# Patient Record
Sex: Female | Born: 1980 | Race: Asian | Hispanic: No | Marital: Single | Smoking: Never smoker
Health system: Southern US, Community
[De-identification: ages and names within clinical notes are randomized; demographics above are authoritative.]

## PROBLEM LIST (undated history)

## (undated) ENCOUNTER — Inpatient Hospital Stay (HOSPITAL_COMMUNITY): Payer: Self-pay

## (undated) DIAGNOSIS — D649 Anemia, unspecified: Secondary | ICD-10-CM

## (undated) DIAGNOSIS — O10919 Unspecified pre-existing hypertension complicating pregnancy, unspecified trimester: Secondary | ICD-10-CM

## (undated) DIAGNOSIS — O139 Gestational [pregnancy-induced] hypertension without significant proteinuria, unspecified trimester: Secondary | ICD-10-CM

## (undated) DIAGNOSIS — Z8759 Personal history of other complications of pregnancy, childbirth and the puerperium: Secondary | ICD-10-CM

## (undated) DIAGNOSIS — O24419 Gestational diabetes mellitus in pregnancy, unspecified control: Secondary | ICD-10-CM

## (undated) HISTORY — PX: NO PAST SURGERIES: SHX2092

## (undated) HISTORY — DX: Gestational diabetes mellitus in pregnancy, unspecified control: O24.419

## (undated) HISTORY — DX: Personal history of other complications of pregnancy, childbirth and the puerperium: Z87.59

## (undated) HISTORY — DX: Unspecified pre-existing hypertension complicating pregnancy, unspecified trimester: O10.919

---

## 2006-02-26 ENCOUNTER — Ambulatory Visit: Payer: Self-pay | Admitting: Internal Medicine

## 2006-02-27 ENCOUNTER — Ambulatory Visit: Payer: Self-pay | Admitting: *Deleted

## 2006-03-10 ENCOUNTER — Encounter: Payer: Self-pay | Admitting: Internal Medicine

## 2006-03-10 ENCOUNTER — Ambulatory Visit: Payer: Self-pay | Admitting: Internal Medicine

## 2006-09-28 ENCOUNTER — Ambulatory Visit: Payer: Self-pay | Admitting: Internal Medicine

## 2006-11-04 ENCOUNTER — Encounter (INDEPENDENT_AMBULATORY_CARE_PROVIDER_SITE_OTHER): Payer: Self-pay | Admitting: *Deleted

## 2006-12-23 ENCOUNTER — Ambulatory Visit (HOSPITAL_COMMUNITY): Admission: RE | Admit: 2006-12-23 | Discharge: 2006-12-23 | Payer: Self-pay | Admitting: Family Medicine

## 2007-04-01 ENCOUNTER — Ambulatory Visit: Payer: Self-pay | Admitting: *Deleted

## 2007-04-01 ENCOUNTER — Inpatient Hospital Stay (HOSPITAL_COMMUNITY): Admission: AD | Admit: 2007-04-01 | Discharge: 2007-04-02 | Payer: Self-pay | Admitting: Gynecology

## 2007-04-05 ENCOUNTER — Inpatient Hospital Stay (HOSPITAL_COMMUNITY): Admission: AD | Admit: 2007-04-05 | Discharge: 2007-04-05 | Payer: Self-pay | Admitting: Gynecology

## 2007-04-05 ENCOUNTER — Ambulatory Visit: Payer: Self-pay | Admitting: *Deleted

## 2007-04-08 ENCOUNTER — Ambulatory Visit: Payer: Self-pay | Admitting: Advanced Practice Midwife

## 2007-04-08 ENCOUNTER — Inpatient Hospital Stay (HOSPITAL_COMMUNITY): Admission: AD | Admit: 2007-04-08 | Discharge: 2007-04-11 | Payer: Self-pay | Admitting: Obstetrics & Gynecology

## 2007-04-08 ENCOUNTER — Ambulatory Visit: Payer: Self-pay | Admitting: Obstetrics & Gynecology

## 2007-05-25 ENCOUNTER — Inpatient Hospital Stay (HOSPITAL_COMMUNITY): Admission: AD | Admit: 2007-05-25 | Discharge: 2007-05-25 | Payer: Self-pay | Admitting: Gynecology

## 2007-11-30 ENCOUNTER — Ambulatory Visit: Payer: Self-pay | Admitting: Internal Medicine

## 2009-11-22 ENCOUNTER — Ambulatory Visit: Payer: Self-pay | Admitting: Obstetrics & Gynecology

## 2009-11-22 LAB — CONVERTED CEMR LAB
ALT: <8 units/L (ref 0–35)
AST: 11 units/L (ref 0–37)
Albumin: 4 g/dL (ref 3.5–5.2)
Alkaline Phosphatase: 35 units/L — ABNORMAL LOW (ref 39–117)
BUN: 6 mg/dL (ref 6–23)
CO2: 22 meq/L (ref 19–32)
Calcium: 9 mg/dL (ref 8.4–10.5)
Chloride: 106 meq/L (ref 96–112)
Creatinine, Ser: 0.45 mg/dL (ref 0.40–1.20)
Glucose, Bld: 97 mg/dL (ref 70–99)
HCT: 33.3 % — ABNORMAL LOW (ref 36.0–46.0)
Hemoglobin: 11.2 g/dL — ABNORMAL LOW (ref 12.0–15.0)
MCHC: 33.6 g/dL (ref 30.0–36.0)
MCV: 66.9 fL — ABNORMAL LOW (ref 78.0–100.0)
Platelets: 263 10*3/uL (ref 150–400)
Potassium: 4.2 meq/L (ref 3.5–5.3)
RBC: 4.98 M/uL (ref 3.87–5.11)
RDW: 16.2 % — ABNORMAL HIGH (ref 11.5–15.5)
Sodium: 137 meq/L (ref 135–145)
TSH: 1.04 microintl units/mL (ref 0.350–4.500)
Total Bilirubin: 0.3 mg/dL (ref 0.3–1.2)
Total Protein: 6.5 g/dL (ref 6.0–8.3)
Uric Acid, Serum: 3.2 mg/dL (ref 2.4–7.0)
WBC: 9.5 10*3/uL (ref 4.0–10.5)

## 2009-11-23 ENCOUNTER — Encounter: Payer: Self-pay | Admitting: Obstetrics & Gynecology

## 2009-11-23 LAB — CONVERTED CEMR LAB
Collection Interval-CRCL: 24 hr
Creatinine 24 HR UR: 1248 mg/24hr (ref 700–1800)
Creatinine Clearance: 192 mL/min — ABNORMAL HIGH (ref 75–115)
Creatinine, Urine: 54.2 mg/dL

## 2009-12-13 ENCOUNTER — Ambulatory Visit (HOSPITAL_COMMUNITY): Admission: RE | Admit: 2009-12-13 | Discharge: 2009-12-13 | Payer: Self-pay | Admitting: Obstetrics & Gynecology

## 2009-12-13 ENCOUNTER — Ambulatory Visit: Payer: Self-pay | Admitting: Obstetrics & Gynecology

## 2009-12-27 ENCOUNTER — Ambulatory Visit: Payer: Self-pay | Admitting: Obstetrics & Gynecology

## 2010-01-17 ENCOUNTER — Ambulatory Visit: Payer: Self-pay | Admitting: Family Medicine

## 2010-01-31 ENCOUNTER — Ambulatory Visit: Payer: Self-pay | Admitting: Obstetrics & Gynecology

## 2010-02-14 ENCOUNTER — Ambulatory Visit: Payer: Self-pay | Admitting: Obstetrics and Gynecology

## 2010-02-21 ENCOUNTER — Ambulatory Visit (HOSPITAL_COMMUNITY)
Admission: RE | Admit: 2010-02-21 | Discharge: 2010-02-21 | Payer: Self-pay | Source: Home / Self Care | Attending: Family Medicine | Admitting: Family Medicine

## 2010-02-28 ENCOUNTER — Ambulatory Visit
Admission: RE | Admit: 2010-02-28 | Discharge: 2010-02-28 | Payer: Self-pay | Source: Home / Self Care | Attending: Obstetrics and Gynecology | Admitting: Obstetrics and Gynecology

## 2010-02-28 ENCOUNTER — Encounter: Payer: Self-pay | Admitting: Obstetrics & Gynecology

## 2010-02-28 LAB — CONVERTED CEMR LAB
HCT: 31.8 % — ABNORMAL LOW (ref 36.0–46.0)
HIV: NONREACTIVE
Hemoglobin: 10.4 g/dL — ABNORMAL LOW (ref 12.0–15.0)
MCHC: 32.7 g/dL (ref 30.0–36.0)
MCV: 67.7 fL — ABNORMAL LOW (ref 78.0–100.0)
Platelets: 248 K/uL (ref 150–400)
RBC: 4.7 M/uL (ref 3.87–5.11)
RDW: 16.6 % — ABNORMAL HIGH (ref 11.5–15.5)
WBC: 10.2 10*3/microliter (ref 4.0–10.5)

## 2010-03-04 LAB — POCT URINALYSIS DIPSTICK
Bilirubin Urine: NEGATIVE
Hgb urine dipstick: NEGATIVE
Ketones, ur: NEGATIVE mg/dL
Nitrite: NEGATIVE
Protein, ur: NEGATIVE mg/dL
Specific Gravity, Urine: 1.01 (ref 1.005–1.030)
Urine Glucose, Fasting: NEGATIVE mg/dL
Urobilinogen, UA: 0.2 mg/dL (ref 0.0–1.0)
pH: 7 (ref 5.0–8.0)

## 2010-03-10 ENCOUNTER — Encounter: Payer: Self-pay | Admitting: Family Medicine

## 2010-03-12 ENCOUNTER — Ambulatory Visit: Admission: RE | Admit: 2010-03-12 | Payer: Self-pay | Source: Home / Self Care | Admitting: Obstetrics & Gynecology

## 2010-03-14 ENCOUNTER — Encounter: Payer: Self-pay | Admitting: Obstetrics & Gynecology

## 2010-03-14 ENCOUNTER — Other Ambulatory Visit: Payer: Self-pay | Admitting: Obstetrics and Gynecology

## 2010-03-14 ENCOUNTER — Ambulatory Visit (HOSPITAL_COMMUNITY)
Admission: RE | Admit: 2010-03-14 | Discharge: 2010-03-14 | Payer: Self-pay | Source: Home / Self Care | Attending: Obstetrics and Gynecology | Admitting: Obstetrics and Gynecology

## 2010-03-14 ENCOUNTER — Ambulatory Visit
Admission: RE | Admit: 2010-03-14 | Discharge: 2010-03-14 | Payer: Self-pay | Source: Home / Self Care | Attending: Obstetrics & Gynecology | Admitting: Obstetrics & Gynecology

## 2010-03-14 DIAGNOSIS — O169 Unspecified maternal hypertension, unspecified trimester: Secondary | ICD-10-CM

## 2010-03-14 LAB — POCT URINALYSIS DIPSTICK
Bilirubin Urine: NEGATIVE
Hgb urine dipstick: NEGATIVE
Ketones, ur: NEGATIVE mg/dL
Nitrite: NEGATIVE
Protein, ur: NEGATIVE mg/dL
Specific Gravity, Urine: 1.02 (ref 1.005–1.030)
Urine Glucose, Fasting: NEGATIVE mg/dL
Urobilinogen, UA: 0.2 mg/dL (ref 0.0–1.0)
pH: 7 (ref 5.0–8.0)

## 2010-03-21 ENCOUNTER — Ambulatory Visit (HOSPITAL_COMMUNITY)
Admission: RE | Admit: 2010-03-21 | Discharge: 2010-03-21 | Disposition: A | Discharge status: Home / Self Care | Payer: Medicaid Other | Source: Ambulatory Visit | Attending: Obstetrics and Gynecology | Admitting: Obstetrics and Gynecology

## 2010-03-21 DIAGNOSIS — Z3689 Encounter for other specified antenatal screening: Secondary | ICD-10-CM | POA: Insufficient documentation

## 2010-03-21 DIAGNOSIS — O139 Gestational [pregnancy-induced] hypertension without significant proteinuria, unspecified trimester: Secondary | ICD-10-CM | POA: Insufficient documentation

## 2010-03-21 DIAGNOSIS — Z8751 Personal history of pre-term labor: Secondary | ICD-10-CM | POA: Insufficient documentation

## 2010-03-21 DIAGNOSIS — O169 Unspecified maternal hypertension, unspecified trimester: Secondary | ICD-10-CM

## 2010-03-28 ENCOUNTER — Other Ambulatory Visit: Payer: Self-pay | Admitting: Obstetrics & Gynecology

## 2010-03-28 ENCOUNTER — Other Ambulatory Visit: Payer: Self-pay

## 2010-03-28 DIAGNOSIS — O169 Unspecified maternal hypertension, unspecified trimester: Secondary | ICD-10-CM

## 2010-04-01 ENCOUNTER — Other Ambulatory Visit: Payer: Medicaid Other

## 2010-04-01 ENCOUNTER — Encounter (HOSPITAL_COMMUNITY): Payer: Self-pay

## 2010-04-01 ENCOUNTER — Ambulatory Visit (HOSPITAL_COMMUNITY)
Admission: RE | Admit: 2010-04-01 | Discharge: 2010-04-01 | Disposition: A | Discharge status: Home / Self Care | Payer: Medicaid Other | Source: Ambulatory Visit | Attending: Obstetrics & Gynecology | Admitting: Obstetrics & Gynecology

## 2010-04-01 DIAGNOSIS — O139 Gestational [pregnancy-induced] hypertension without significant proteinuria, unspecified trimester: Secondary | ICD-10-CM | POA: Insufficient documentation

## 2010-04-01 DIAGNOSIS — Z8751 Personal history of pre-term labor: Secondary | ICD-10-CM | POA: Insufficient documentation

## 2010-04-01 DIAGNOSIS — O169 Unspecified maternal hypertension, unspecified trimester: Secondary | ICD-10-CM

## 2010-04-02 ENCOUNTER — Ambulatory Visit (HOSPITAL_COMMUNITY): Payer: Medicaid Other

## 2010-04-04 ENCOUNTER — Other Ambulatory Visit: Payer: Medicaid Other

## 2010-04-04 ENCOUNTER — Other Ambulatory Visit: Payer: Self-pay

## 2010-04-04 DIAGNOSIS — O169 Unspecified maternal hypertension, unspecified trimester: Secondary | ICD-10-CM

## 2010-04-04 LAB — POCT URINALYSIS DIPSTICK
Bilirubin Urine: NEGATIVE
Hgb urine dipstick: NEGATIVE
Ketones, ur: NEGATIVE mg/dL
Nitrite: NEGATIVE
Protein, ur: NEGATIVE mg/dL
Specific Gravity, Urine: 1.01 (ref 1.005–1.030)
Urine Glucose, Fasting: NEGATIVE mg/dL
Urobilinogen, UA: 0.2 mg/dL (ref 0.0–1.0)
pH: 6 (ref 5.0–8.0)

## 2010-04-08 ENCOUNTER — Other Ambulatory Visit: Payer: Medicaid Other

## 2010-04-08 DIAGNOSIS — O169 Unspecified maternal hypertension, unspecified trimester: Secondary | ICD-10-CM

## 2010-04-11 ENCOUNTER — Other Ambulatory Visit: Payer: Medicaid Other

## 2010-04-11 ENCOUNTER — Other Ambulatory Visit: Payer: Self-pay

## 2010-04-11 DIAGNOSIS — O169 Unspecified maternal hypertension, unspecified trimester: Secondary | ICD-10-CM

## 2010-04-11 LAB — POCT URINALYSIS DIPSTICK
Bilirubin Urine: NEGATIVE
Hgb urine dipstick: NEGATIVE
Ketones, ur: NEGATIVE mg/dL
Nitrite: NEGATIVE
Protein, ur: NEGATIVE mg/dL
Specific Gravity, Urine: 1.01 (ref 1.005–1.030)
Urine Glucose, Fasting: NEGATIVE mg/dL
Urobilinogen, UA: 0.2 mg/dL (ref 0.0–1.0)
pH: 6.5 (ref 5.0–8.0)

## 2010-04-13 ENCOUNTER — Inpatient Hospital Stay (HOSPITAL_COMMUNITY)
Admission: AD | Admit: 2010-04-13 | Discharge: 2010-04-15 | DRG: 774 | Disposition: A | Discharge status: Home / Self Care | Payer: Medicaid Other | Source: Ambulatory Visit | Attending: Obstetrics & Gynecology | Admitting: Obstetrics & Gynecology

## 2010-04-13 DIAGNOSIS — O4290 Premature rupture of membranes, unspecified as to length of time between rupture and onset of labor, unspecified weeks of gestation: Principal | ICD-10-CM | POA: Diagnosis present

## 2010-04-13 DIAGNOSIS — O1002 Pre-existing essential hypertension complicating childbirth: Secondary | ICD-10-CM | POA: Diagnosis present

## 2010-04-13 LAB — CBC
HCT: 31.1 % — ABNORMAL LOW (ref 36.0–46.0)
Hemoglobin: 9.8 g/dL — ABNORMAL LOW (ref 12.0–15.0)
MCH: 20.7 pg — ABNORMAL LOW (ref 26.0–34.0)
MCHC: 31.5 g/dL (ref 30.0–36.0)
MCV: 65.6 fL — ABNORMAL LOW (ref 78.0–100.0)
Platelets: 200 10*3/uL (ref 150–400)
RBC: 4.74 MIL/uL (ref 3.87–5.11)
RDW: 18.4 % — ABNORMAL HIGH (ref 11.5–15.5)
WBC: 9.2 10*3/uL (ref 4.0–10.5)

## 2010-04-13 LAB — RPR: RPR Ser Ql: NONREACTIVE

## 2010-04-15 ENCOUNTER — Other Ambulatory Visit: Payer: Medicaid Other

## 2010-04-18 ENCOUNTER — Other Ambulatory Visit: Payer: Medicaid Other

## 2010-04-29 LAB — POCT URINALYSIS DIPSTICK
Bilirubin Urine: NEGATIVE
Bilirubin Urine: NEGATIVE
Glucose, UA: NEGATIVE mg/dL
Glucose, UA: NEGATIVE mg/dL
Hgb urine dipstick: NEGATIVE
Hgb urine dipstick: NEGATIVE
Ketones, ur: NEGATIVE mg/dL
Ketones, ur: NEGATIVE mg/dL
Nitrite: NEGATIVE
Nitrite: NEGATIVE
Protein, ur: NEGATIVE mg/dL
Protein, ur: NEGATIVE mg/dL
Specific Gravity, Urine: 1.015 (ref 1.005–1.030)
Specific Gravity, Urine: <=1.005 (ref 1.005–1.030)
Urobilinogen, UA: 0.2 mg/dL (ref 0.0–1.0)
Urobilinogen, UA: 0.2 mg/dL (ref 0.0–1.0)
pH: 7 (ref 5.0–8.0)
pH: 7 (ref 5.0–8.0)

## 2010-04-30 LAB — POCT URINALYSIS DIPSTICK
Bilirubin Urine: NEGATIVE
Bilirubin Urine: NEGATIVE
Glucose, UA: NEGATIVE mg/dL
Glucose, UA: NEGATIVE mg/dL
Hgb urine dipstick: NEGATIVE
Hgb urine dipstick: NEGATIVE
Ketones, ur: NEGATIVE mg/dL
Ketones, ur: NEGATIVE mg/dL
Nitrite: NEGATIVE
Nitrite: NEGATIVE
Protein, ur: NEGATIVE mg/dL
Protein, ur: NEGATIVE mg/dL
Specific Gravity, Urine: 1.025 (ref 1.005–1.030)
Specific Gravity, Urine: 1.025 (ref 1.005–1.030)
Urobilinogen, UA: 0.2 mg/dL (ref 0.0–1.0)
Urobilinogen, UA: 0.2 mg/dL (ref 0.0–1.0)
pH: 6.5 (ref 5.0–8.0)
pH: 6.5 (ref 5.0–8.0)

## 2010-05-01 LAB — POCT URINALYSIS DIPSTICK
Bilirubin Urine: NEGATIVE
Glucose, UA: NEGATIVE mg/dL
Hgb urine dipstick: NEGATIVE
Ketones, ur: NEGATIVE mg/dL
Nitrite: NEGATIVE
Protein, ur: NEGATIVE mg/dL
Specific Gravity, Urine: 1.015 (ref 1.005–1.030)
Urobilinogen, UA: 0.2 mg/dL (ref 0.0–1.0)
pH: 7.5 (ref 5.0–8.0)

## 2010-05-02 LAB — POCT URINALYSIS DIPSTICK
Bilirubin Urine: NEGATIVE
Glucose, UA: NEGATIVE mg/dL
Hgb urine dipstick: NEGATIVE
Ketones, ur: NEGATIVE mg/dL
Nitrite: NEGATIVE
Protein, ur: NEGATIVE mg/dL
Specific Gravity, Urine: 1.01 (ref 1.005–1.030)
Urobilinogen, UA: 0.2 mg/dL (ref 0.0–1.0)
pH: 6 (ref 5.0–8.0)

## 2010-05-22 ENCOUNTER — Inpatient Hospital Stay (HOSPITAL_COMMUNITY): Admission: AD | Admit: 2010-05-22 | Payer: Self-pay | Admitting: Family Medicine

## 2010-06-13 ENCOUNTER — Ambulatory Visit (INDEPENDENT_AMBULATORY_CARE_PROVIDER_SITE_OTHER): Payer: Medicaid Other | Admitting: Obstetrics and Gynecology

## 2010-06-14 NOTE — Group Therapy Note (Signed)
Tammy Byrd, Tammy Byrd NO.:  [id number]  MEDICAL RECORD NO.:  [id number]           PATIENT TYPE:  A  LOCATION:  [facility]                   FACILITY:  [facility]  PHYSICIAN:  Argentina Donovan, MD        DATE OF BIRTH:  Apr 24, 1980  DATE OF SERVICE:  06/14/2010                                 CLINIC NOTE  The patient is a 30 year old gravida 2, para 1-1-0-2 who had a normal spontaneous delivery of a full-term baby with no complications.  She was on labetalol for hypertension and today her blood pressure is 125/80.  I have told her to stop the medication and then come back in 2 weeks for blood pressure check.  In the meantime, we talked about contraception and she has decided after discussion of all the different types to go to the health department and get Implanon.  She has complained of some constipation and feels something coming out.  We examined her and found that she does not have any hemorrhoids and I am putting her on Amitiza 24 mcg b.i.d. to see if that will control that.  Meanwhile, we will get a referral to the Health Department for her Implanon insertion.  Other than that, normal postpartum examination.          ______________________________ Argentina Donovan, MD    PR/MEDQ  D:  06/13/2010  T:  06/14/2010  Job:  [id number]

## 2010-07-02 NOTE — Discharge Summary (Signed)
NAMEKATLYN, MULDREW                     ACCOUNT NO.:  [id number]   MEDICAL RECORD NO.:  [id number]          PATIENT TYPE:  INP   LOCATION:  9158                          [facility]   PHYSICIAN:  Tanya S. Shawnie Pons, M.D.   DATE OF BIRTH:  04-24-1980   DATE OF ADMISSION:  04/01/2007  DATE OF DISCHARGE:  04/02/2007                               DISCHARGE SUMMARY   ADMISSION DIAGNOSES:  1. Intrauterine pregnancy at 32 weeks 2 days.  2. Pregnancy induced hypertension.   DISCHARGE DIAGNOSES:  1. Intrauterine pregnancy at 32 weeks 3 days.  2. Mild pre-eclampsia.  3. Hyponatremia   PROCEDURE:  The patient had an ultrasound performed on the day of  admission showing a single gestation, cephalic presentation, placenta  was posterior above the cervical os.  AFI was 17.21 in the 63rd  percentile.  Estimated fetal weight was 1977 grams in the 60th  percentile.  Cervical length was 3.03-cm, measured trans-labially.   COMPLICATIONS:  None.   CONSULTATIONS:  None.   PERTINENT LABORATORY FINDINGS:  The patient's admission CBC showing  white blood cell count 12, hemoglobin 10.9, hematocrit 33.5, platelets  241.  Urinalysis showed 15 ketones, 0.2 urobilinogen, it was otherwise  negative.  Complete metabolic panel showed sodium 127, potassium 3.5,  chloride 96, CO2 22, glucose 85, BUN 4, creatinine 0.39.  Total  bilirubin 0.7, AST 17, ALT 12, uric acid 4.5, and creatinine clearance  was 97.  A 24-hour urine creatinine was 543 and urine volume was 3850.  A 24-hour protein was pending at time of dictation.   BRIEF PERTINENT ADMISSION HISTORY:  Ms. Meanor is a 30 year old gravida 1,  para 0 at 32 weeks 1 day gestation that was sent to the maternity  admission unit from the health department for evaluation of elevated  blood pressure.  Upon evaluation in the MAU, her blood pressure was  139/102, followed by 153/92.  She also was noted to have a headache.  She was admitted for evaluation.  Labs were drawn  with values stated  above.  She had a 24-hour urine begun and she was started on  betamethasone for fetal lung maturity.  Her headache resolved after  admission and she had no other signs or symptoms of pre-eclampsia.  She  had 1 to 2 plus deep tendon reflexes, no clonus.  She denied vision  changes, continued headache, right upper quadrant epigastric pain.  She  was in stable condition and NST was reactive prior to discharge.  She  did have a few contractions and her cervix was checked prior to  discharge.  She was closed, long, and high.  Her contractions were not  painful.  She described them as balling up of her stomach.  She was in  stable condition and ready for discharge.   DISCHARGE STATUS:  Stable.   DISCHARGE MEDICATIONS:  Prenatal vitamins one tablet p.o. daily.   DISCHARGE INSTRUCTIONS:  1. Discharged home.  2. Regular diet.  3. Activity as tolerated.  4. The patient was instructed to monitor for symptoms of decreased  fetal movement, headache, vision changes, or other concerns.  5. The patient is to follow up in the high risk clinic.  I have      contacted the high risk clinic and an appointment will be made.      She is to call [phone] to confirm when that appointment is.      Karlton Lemon, MD  Electronically Signed     ______________________________  Shelbie Proctor. Shawnie Pons, M.D.    NS/MEDQ  D:  04/02/2007  T:  04/04/2007  Job:  [id number]

## 2010-07-02 NOTE — Discharge Summary (Signed)
Tammy Byrd, Tammy Byrd                     ACCOUNT NO.:  [id number]   MEDICAL RECORD NO.:  [id number]          PATIENT TYPE:  INP   LOCATION:  9318                          [facility]   PHYSICIAN:  Tilda Burrow, M.D. DATE OF BIRTH:  1980-04-02   DATE OF ADMISSION:  04/08/2007  DATE OF DISCHARGE:  04/11/2007                               DISCHARGE SUMMARY   ADMITTING DIAGNOSIS:  Pregnancy 33 weeks 2 days, severe preeclampsia.   DISCHARGE DIAGNOSIS:  Pregnancy at 33 weeks, 3 days delivered.   PROCEDURE:  Foley bulb cervical ripening, magnesium sulfate, seizure  prophylaxis, Pitocin induction of labor, spontaneous vertex vaginal  delivery. Cathie Beams, C.N.M. The delivery was on the 20th.   DISCHARGE MEDICATIONS:  1. Labetalol 100 mg p.o. b.i.d. x2 weeks.  2. Prenatal vitamins daily.   HOSPITAL SUMMARY:  This is a 30 year old female at 33 weeks 2 days who  was admitted from the clinic for severe preeclampsia with blood pressure  of 151/104 with proteinuria and hyperreflexia.  The patient had a Foley  bulb initiated with Pitocin initiated 6 hours later with good response  to the Pitocin. She progressed to deliver within 18 hours of admission  of a spontaneous vaginal delivery, female, Apgars 9 and 9. The baby was  taken to the NICU due to prematurity.   The mother received magnesium sulfate seizure prophylaxis for 24 hours  with good blood pressure response with good blood pressures in the  120s/70s with pulses in the 90s.  Postpartum hemoglobin was 8.9,  hematocrit 27%, compared to admitting hemoglobin of 11.9.  Follow-up  will be in 2 weeks for blood pressure check and then routine postpartum  visit 4 weeks thereafter.   FUTURE CONTRACEPTION:  Unplanned.      Tilda Burrow, M.D.  Electronically Signed     JVF/MEDQ  D:  04/11/2007  T:  04/12/2007  Job:  [id number]

## 2010-11-08 LAB — COMPREHENSIVE METABOLIC PANEL
ALT: 12
ALT: <8
AST: 17
Albumin: 2.8 — ABNORMAL LOW
Alkaline Phosphatase: 106
Alkaline Phosphatase: 105
BUN: 4 — ABNORMAL LOW
BUN: 6
CO2: 22
Calcium: 9.2
Calcium: 10.1
Chloride: 96
Chloride: 97
Creatinine, Ser: 0.39 — ABNORMAL LOW
Creatinine, Ser: 0.4
GFR calc Af Amer: >60
GFR calc Af Amer: >60
GFR calc non Af Amer: >60
Glucose, Bld: 85
Glucose, Bld: 87
Glucose, Bld: 90
Potassium: 3.5
Potassium: 4
Sodium: 127 — ABNORMAL LOW
Sodium: 136
Total Bilirubin: 0.7
Total Bilirubin: 0.6
Total Protein: 6.5
Total Protein: 6

## 2010-11-08 LAB — CBC
HCT: 33.5 — ABNORMAL LOW
HCT: 27.1 — ABNORMAL LOW
HCT: 32.7 — ABNORMAL LOW
HCT: 33.8 — ABNORMAL LOW
Hemoglobin: 10.9 — ABNORMAL LOW
Hemoglobin: 10.7 — ABNORMAL LOW
MCHC: 32.7
MCV: 69.8 — ABNORMAL LOW
MCV: 69.7 — ABNORMAL LOW
MCV: 70 — ABNORMAL LOW
MCV: 70.2 — ABNORMAL LOW
Platelets: 241
Platelets: 182
Platelets: 196
Platelets: 256
RBC: 4.8
RDW: 16.4 — ABNORMAL HIGH
RDW: 16.5 — ABNORMAL HIGH
RDW: 16.5 — ABNORMAL HIGH
RDW: 16.6 — ABNORMAL HIGH
WBC: 12 — ABNORMAL HIGH
WBC: 12.5 — ABNORMAL HIGH
WBC: 18.7 — ABNORMAL HIGH

## 2010-11-08 LAB — CREATININE CLEARANCE, URINE, 24 HOUR
Collection Interval-CRCL: 24
Creatinine Clearance: 97
Creatinine, Urine: 14.1
Urine Total Volume-CRCL: 3850

## 2010-11-08 LAB — POCT URINALYSIS DIP (DEVICE)
Nitrite: NEGATIVE
Protein, ur: >=300 — AB
Urobilinogen, UA: 0.2
pH: 6.5

## 2010-11-08 LAB — URINALYSIS, ROUTINE W REFLEX MICROSCOPIC
Bilirubin Urine: NEGATIVE
Glucose, UA: NEGATIVE
Hgb urine dipstick: NEGATIVE
Ketones, ur: 15 — AB
Nitrite: NEGATIVE
Protein, ur: NEGATIVE
Specific Gravity, Urine: <1.005 — ABNORMAL LOW
Urobilinogen, UA: 0.2
pH: 7

## 2010-11-08 LAB — PROTEIN, URINE, 24 HOUR
Collection Interval-UPROT: 24
Protein, 24H Urine: 1078 — ABNORMAL HIGH
Protein, Urine: 28
Urine Total Volume-UPROT: 3850

## 2010-11-08 LAB — URIC ACID
Uric Acid, Serum: 4.5
Uric Acid, Serum: 4.6

## 2010-11-08 LAB — LACTATE DEHYDROGENASE: LDH: 147

## 2010-11-08 LAB — STREP B DNA PROBE

## 2010-11-08 LAB — RPR: RPR Ser Ql: NONREACTIVE

## 2013-12-19 ENCOUNTER — Encounter (HOSPITAL_COMMUNITY): Payer: Self-pay

## 2015-02-18 NOTE — L&D Delivery Note (Signed)
Delivery Note   Requested by Dr. Adrian BlackwaterStinson to attend this primary C-section delivery at 35 3/[redacted] weeks GA due to preterm labor, breech presentation.   Born to a G3P1, GBS unknown mother with limited PNC.  Pregnancy complicated by chronic hypertension, gestational diabetes.   Intrapartum course complicated by preterm labor. ROM occurred at delivery with clear fluid.   Infant with decreased tone, dusky with initial good spontaneous cry.  Routine NRP followed including warming, drying and stimulation. Good response.   Apgars 6 / 9.  Physical exam within normal limits but notable for sacral dimple.   Left in OR for skin-to-skin contact with mother, in care of CN staff.  Care transferred to Pediatrician.  Jaisen Wiltrout T, RN, NNP-BC

## 2015-04-06 ENCOUNTER — Encounter (HOSPITAL_COMMUNITY): Payer: Self-pay

## 2015-04-06 ENCOUNTER — Inpatient Hospital Stay (HOSPITAL_COMMUNITY)
Admission: AD | Admit: 2015-04-06 | Discharge: 2015-04-06 | Disposition: A | Discharge status: Home / Self Care | Payer: BLUE CROSS/BLUE SHIELD | Source: Ambulatory Visit | Attending: Obstetrics & Gynecology | Admitting: Obstetrics & Gynecology

## 2015-04-06 DIAGNOSIS — O161 Unspecified maternal hypertension, first trimester: Secondary | ICD-10-CM | POA: Diagnosis not present

## 2015-04-06 DIAGNOSIS — O162 Unspecified maternal hypertension, second trimester: Secondary | ICD-10-CM

## 2015-04-06 DIAGNOSIS — Z3A13 13 weeks gestation of pregnancy: Secondary | ICD-10-CM | POA: Insufficient documentation

## 2015-04-06 DIAGNOSIS — I10 Essential (primary) hypertension: Secondary | ICD-10-CM | POA: Diagnosis present

## 2015-04-06 HISTORY — DX: Gestational (pregnancy-induced) hypertension without significant proteinuria, unspecified trimester: O13.9

## 2015-04-06 LAB — URINALYSIS, ROUTINE W REFLEX MICROSCOPIC
BILIRUBIN URINE: NEGATIVE
GLUCOSE, UA: NEGATIVE mg/dL
Hgb urine dipstick: NEGATIVE
KETONES UR: 40 mg/dL — AB
Leukocytes, UA: NEGATIVE
Nitrite: NEGATIVE
PH: 6 (ref 5.0–8.0)
Protein, ur: NEGATIVE mg/dL
SPECIFIC GRAVITY, URINE: 1.015 (ref 1.005–1.030)

## 2015-04-06 MED ORDER — LABETALOL HCL 200 MG PO TABS: 200.0000 mg | ORAL_TABLET | Freq: Two times a day (BID) | ORAL | Status: DC

## 2015-04-06 NOTE — Discharge Instructions (Signed)
Hypertension During Pregnancy °Hypertension is also called high blood pressure. Blood pressure moves blood in your body. Sometimes, the force that moves the blood becomes too strong. When you are pregnant, this condition should be watched carefully. It can cause problems for you and your baby. °HOME CARE  °· Make and keep all of your doctor visits. °· Take medicine as told by your doctor. Tell your doctor about all medicines you take. °· Eat very little salt. °· Exercise regularly. °· Do not drink alcohol. °· Do not smoke. °· Do not have drinks with caffeine. °· Lie on your left side when resting. °· Your health care provider may ask you to take one low-dose aspirin (81mg) each day. °GET HELP RIGHT AWAY IF: °· You have bad belly (abdominal) pain. °· You have sudden puffiness (swelling) in the hands, ankles, or face. °· You gain 4 pounds (1.8 kilograms) or more in 1 week. °· You throw up (vomit) repeatedly. °· You have bleeding from the vagina. °· You do not feel the baby moving as much. °· You have a headache. °· You have blurred or double vision. °· You have muscle twitching or spasms. °· You have shortness of breath. °· You have blue fingernails and lips. °· You have blood in your pee (urine). °MAKE SURE YOU: °· Understand these instructions. °· Will watch your condition. °· Will get help right away if you are not doing well or get worse. °  °This information is not intended to replace advice given to you by your health care provider. Make sure you discuss any questions you have with your health care provider. °  °Document Released: 03/08/2010 Document Revised: 02/24/2014 Document Reviewed: 09/02/2012 °Elsevier Interactive Patient Education ©2016 [employer] ° °

## 2015-04-06 NOTE — MAU Note (Signed)
Was at [facility] for pregnancy confirmation today blood pressure elevated, they spoke with Dr. Macon Large was told to come to MAU and be set up for High Risk Clinic, patient denies history of high blood pressure, asymptomatic.

## 2015-04-06 NOTE — MAU Provider Note (Signed)
History     CSN: [id number]  Arrival date and time: 04/06/15 1035   First Provider Initiated Contact with Patient 04/06/15 1103      Chief Complaint  Patient presents with  . Hypertension   HPI Ms. Tammy Byrd is a 35 y.o. G3P1002 at [redacted]w[redacted]d who presents to MAU today from the [facility] with complaint of elevated blood pressure. The patient states a history of HTN in previous pregnancies, but is unsure of diagnosis of CHTN as she rarely goes to the doctor when she isn't pregnant. She was seen in [facility] for HTN in pregnancy with her previous pregnancies, the last of which was 5 years ago. She denies headache, vision changes, abdominal pain or vaginal bleeding today.   OB History    Gravida Para Term Preterm AB TAB SAB Ectopic Multiple Living   0 2      Past Medical History  Diagnosis Date  . Pregnancy induced hypertension     Past Surgical History  Procedure Laterality Date  . No past surgeries      History reviewed. No pertinent family history.  Social History  Substance Use Topics  . Smoking status: Never Smoker   . Smokeless tobacco: None  . Alcohol Use: No    Allergies: No Known Allergies  Prescriptions prior to admission  Medication Sig Dispense Refill Last Dose  . acetaminophen (TYLENOL) 325 MG tablet Take 325 mg by mouth every 6 (six) hours as needed for mild pain or headache.   04/05/2015 at Unknown time  . Prenatal Vit-Fe Fumarate-FA (PRENATAL MULTIVITAMIN) TABS tablet Take 1 tablet by mouth daily at 12 noon.   04/05/2015 at Unknown time    Review of Systems  Constitutional: Negative for malaise/fatigue.  Eyes: Negative for blurred vision.  Gastrointestinal: Negative for abdominal pain.  Genitourinary:       Neg - vaginal bleeding  Neurological: Negative for headaches.   Physical Exam   Blood pressure 141/81, pulse 96, temperature 97.9 F (36.6 C), temperature source Oral, resp. rate 18, height 5' 1.5" (1.562 m), weight 170 lb 4.8 oz (77.248 kg), last  menstrual period 12/30/2014, unknown if currently breastfeeding.  Physical Exam  Nursing note and vitals reviewed. Constitutional: She is oriented to person, place, and time. She appears well-developed and well-nourished. No distress.  HENT:  Head: Normocephalic and atraumatic.  Cardiovascular: Normal rate.   Respiratory: Effort normal.  GI: Soft. She exhibits no distension and no mass. There is no tenderness. There is no rebound and no guarding.  Neurological: She is alert and oriented to person, place, and time.  Skin: Skin is warm and dry. No erythema.  Psychiatric: She has a normal mood and affect.    Results for orders placed or performed during the hospital encounter of 04/06/15 (from the past 24 hour(s))  Urinalysis, Routine w reflex microscopic (not at [facility])     Status: Abnormal   Collection Time: 04/06/15 10:50 AM  Result Value Ref Range   Color, Urine YELLOW YELLOW   APPearance CLEAR CLEAR   Specific Gravity, Urine 1.015 1.005 - 1.030   pH 6.0 5.0 - 8.0   Glucose, UA NEGATIVE NEGATIVE mg/dL   Hgb urine dipstick NEGATIVE NEGATIVE   Bilirubin Urine NEGATIVE NEGATIVE   Ketones, ur 40 (A) NEGATIVE mg/dL   Protein, ur NEGATIVE NEGATIVE mg/dL   Nitrite NEGATIVE NEGATIVE   Leukocytes, UA NEGATIVE NEGATIVE    Patient Vitals for the past 24 hrs:  BP  Temp Temp src Pulse Resp Height Weight  04/06/15 1144 141/81 mmHg - - 96 - - -  04/06/15 1104 151/88 mmHg - - 102 - - -  04/06/15 1047 161/90 mmHg 97.9 F (36.6 C) Oral 99 18 5' 1.5" (1.562 m) 170 lb 4.8 oz (77.248 kg)    MAU Course  Procedures None  MDM FHR - 150 bpm with doppler UA today Discussed patient with Dr. Macon Large. Start on Labetalol 200 mg BID and give appointment to start prenatal care with [facility]  Assessment and Plan  A: SIUP at [redacted]w[redacted]d Hypertension  P: Discharge home Rx for Labetalol given to patient Early pregnancy and HTN precautions discussed Patient advised to follow-up with [facility] on Wednesday at  1:00pm to start prenatal care Patient may return to MAU as needed or if her condition were to change or worsen   Marny Lowenstein, PA-C  04/06/2015, 11:54 AM

## 2015-04-11 ENCOUNTER — Encounter: Payer: Self-pay | Admitting: Advanced Practice Midwife

## 2015-04-11 ENCOUNTER — Ambulatory Visit (INDEPENDENT_AMBULATORY_CARE_PROVIDER_SITE_OTHER): Payer: BLUE CROSS/BLUE SHIELD | Admitting: Advanced Practice Midwife

## 2015-04-11 VITALS — BP 142/88 | HR 85 | Temp 98.2°F | Wt 173.4 lb

## 2015-04-11 DIAGNOSIS — O09522 Supervision of elderly multigravida, second trimester: Secondary | ICD-10-CM | POA: Diagnosis not present

## 2015-04-11 DIAGNOSIS — O10912 Unspecified pre-existing hypertension complicating pregnancy, second trimester: Secondary | ICD-10-CM

## 2015-04-11 DIAGNOSIS — Z1151 Encounter for screening for human papillomavirus (HPV): Secondary | ICD-10-CM | POA: Diagnosis not present

## 2015-04-11 DIAGNOSIS — Z23 Encounter for immunization: Secondary | ICD-10-CM | POA: Diagnosis not present

## 2015-04-11 DIAGNOSIS — Z113 Encounter for screening for infections with a predominantly sexual mode of transmission: Secondary | ICD-10-CM

## 2015-04-11 DIAGNOSIS — O10919 Unspecified pre-existing hypertension complicating pregnancy, unspecified trimester: Secondary | ICD-10-CM

## 2015-04-11 DIAGNOSIS — Z124 Encounter for screening for malignant neoplasm of cervix: Secondary | ICD-10-CM

## 2015-04-11 DIAGNOSIS — O09899 Supervision of other high risk pregnancies, unspecified trimester: Secondary | ICD-10-CM

## 2015-04-11 DIAGNOSIS — O99012 Anemia complicating pregnancy, second trimester: Secondary | ICD-10-CM

## 2015-04-11 DIAGNOSIS — D573 Sickle-cell trait: Secondary | ICD-10-CM | POA: Diagnosis not present

## 2015-04-11 LAB — POCT URINALYSIS DIP (DEVICE)
Bilirubin Urine: NEGATIVE
Glucose, UA: NEGATIVE mg/dL
Hgb urine dipstick: NEGATIVE
KETONES UR: NEGATIVE mg/dL
Leukocytes, UA: NEGATIVE
Nitrite: NEGATIVE
PH: 6 (ref 5.0–8.0)
PROTEIN: 30 mg/dL — AB
Specific Gravity, Urine: 1.02 (ref 1.005–1.030)
Urobilinogen, UA: 0.2 mg/dL (ref 0.0–1.0)

## 2015-04-11 NOTE — Progress Notes (Signed)
Initial prenatal info packet given Breastfeeding tip of the week reviewed Initial prenatal labs today Flu today

## 2015-04-11 NOTE — Patient Instructions (Signed)

## 2015-04-11 NOTE — Progress Notes (Signed)
Subjective:    Tammy Byrd is a [id number] [redacted]w[redacted]d by LMP being seen today for her first obstetrical visit.  Her obstetrical history is significant for CHNT, PTD 2/2 HTN, AMA, possible sickle cell trait (unsure, results not in chart). Patient does intend to breast feed. Pregnancy history fully reviewed.  She was seen in MAU first trimester. HTN noted. Started on Labetalol 200 mg PO BID. States she had been taking as directed. Reports dizziness for a while after morning dose, but also is not eating or drinking much around that time.   Patient reports no bleeding and no cramping.  Filed Vitals:   04/11/15 1325  BP: 150/86  Pulse: 85  Temp: 98.2 F (36.8 C)  Weight: 173 lb 6.4 oz (78.654 kg)    HISTORY: OB History  Gravida Para Term Preterm AB SAB TAB Ectopic Multiple Living  0 4    # Outcome Date GA Lbr Len/2nd Weight Sex Delivery Anes PTL Lv  3 Current           2 Term 04/13/10    F Vag-Spont EPI N Y  1 Preterm 04/09/07 [redacted]w[redacted]d   F Vag-Spont EPI  Y     Past Medical History  Diagnosis Date  . Pregnancy induced hypertension    Past Surgical History  Procedure Laterality Date  . No past surgeries     History reviewed. No pertinent family history.   Exam   Pos FHR Uterus:   14-week size  Pelvic Exam:    Perineum: No Hemorrhoids, Normal Perineum   Vulva: normal   Vagina:  normal mucosa, normal discharge   pH: NA   Cervix: multiparous appearance, no cervical motion tenderness and scant bleeding after pap   Adnexa: normal adnexa and no mass, fullness, tenderness   Bony Pelvis: gynecoid  System: Breast:  declined   Skin: normal coloration and turgor, no rashes    Neurologic: oriented, normal mood, grossly non-focal   Extremities: normal strength, tone, and muscle mass   HEENT oropharynx clear, no lesions, neck supple with midline trachea and thyroid without masses   Mouth/Teeth dental hygiene good   Neck supple and no masses   Cardiovascular: regular rate and  rhythm, no murmurs or gallops   Respiratory:  appears well, vitals normal, no respiratory distress, acyanotic, normal RR, chest clear, no wheezing, crepitations, rhonchi, normal symmetric air entry   Abdomen: soft, non-tender; bowel sounds normal; no masses,  no organomegaly and gravid, S=D   Urinary: urethral meatus normal      Assessment:    Pregnancy: [id number] 1. Supervision of other high risk pregnancy, antepartum, unspecified trimester  - Prenatal Profile - Hemoglobinopathy evaluation - GC/Chlamydia probe amp ([place])not at [facility] - Cytology - PAP - Prescript Monitor Profile(19) - Culture, OB Urine - [place] MFM OB COMP + 14 WK; Future - Flu Vaccine QUAD 36+ mos IM; Standing - Flu Vaccine QUAD 36+ mos IM  2. Chronic hypertension during pregnancy, antepartum  - Prenatal Profile - Hemoglobinopathy evaluation - GC/Chlamydia probe amp ([place])not at [facility] - Cytology - PAP - Prescript Monitor Profile(19) - Culture, OB Urine - [place] MFM OB COMP + 14 WK; Future - Flu Vaccine QUAD 36+ mos IM; Standing - Flu Vaccine QUAD 36+ mos IM  3. Sickle cell trait (HCC)???  - Hemoglobinopathy evaluation  4. AMA (advanced maternal age) multigravida 35+, second trimester  - Contractor. Declined Genetic Counseling, NIPS for now.  Plan:    Encouraged to increase PO fluids, eat breakfast regularly, have small frequent meals/snack Q2 hours. May help to take labetalol after breakfast.  Initial labs drawn. Prenatal vitamins. Problem list reviewed and updated. Genetic Screening discussed Quad Screen: requested at [place]. Ultrasound discussed; fetal survey: ordered. Follow up in 1 week for BP check, 4 weeks for ROB. Discussed may need to increase Labetalol or switch to other med if dizziness continues to be a problem. ASA, Baseline 24 hour urine, Cr Will Need Antenatal testing   Dorathy Kinsman 04/11/2015

## 2015-04-12 LAB — PRENATAL PROFILE (SOLSTAS)
Antibody Screen: NEGATIVE
BASOS ABS: 0 10*3/uL (ref 0.0–0.1)
BASOS PCT: 0 % (ref 0–1)
Eosinophils Absolute: 0.2 10*3/uL (ref 0.0–0.7)
Eosinophils Relative: 2 % (ref 0–5)
HEMATOCRIT: 34 % — AB (ref 36.0–46.0)
HEMOGLOBIN: 11 g/dL — AB (ref 12.0–15.0)
HEP B S AG: NEGATIVE
HIV 1&2 Ab, 4th Generation: NONREACTIVE
LYMPHS PCT: 23 % (ref 12–46)
Lymphs Abs: 2.4 10*3/uL (ref 0.7–4.0)
MCH: 22.6 pg — ABNORMAL LOW (ref 26.0–34.0)
MCHC: 32.4 g/dL (ref 30.0–36.0)
MCV: 70 fL — AB (ref 78.0–100.0)
MONO ABS: 0.5 10*3/uL (ref 0.1–1.0)
Monocytes Relative: 5 % (ref 3–12)
NEUTROS ABS: 7.3 10*3/uL (ref 1.7–7.7)
Neutrophils Relative %: 70 % (ref 43–77)
Platelets: 321 10*3/uL (ref 150–400)
RBC: 4.86 MIL/uL (ref 3.87–5.11)
RDW: 17.9 % — ABNORMAL HIGH (ref 11.5–15.5)
RH TYPE: POSITIVE
Rubella: 25.1 Index — ABNORMAL HIGH (ref <0.90)
WBC: 10.4 10*3/uL (ref 4.0–10.5)

## 2015-04-12 LAB — HEMOGLOBINOPATHY EVALUATION
HEMOGLOBIN OTHER: 17.5 % — AB
HGB A2 QUANT: 4 % — AB (ref 2.2–3.2)
HGB F QUANT: 0.3 % (ref 0.0–2.0)
Hgb A: 78.2 % — ABNORMAL LOW (ref 96.8–97.8)
Hgb S Quant: 0 %

## 2015-04-12 LAB — GC/CHLAMYDIA PROBE AMP (~~LOC~~) NOT AT ARMC
CHLAMYDIA, DNA PROBE: NEGATIVE
NEISSERIA GONORRHEA: NEGATIVE

## 2015-04-13 LAB — CULTURE, OB URINE: Colony Count: 60000

## 2015-04-13 LAB — CYTOLOGY - PAP

## 2015-04-14 DIAGNOSIS — O10919 Unspecified pre-existing hypertension complicating pregnancy, unspecified trimester: Secondary | ICD-10-CM | POA: Insufficient documentation

## 2015-04-14 DIAGNOSIS — D573 Sickle-cell trait: Secondary | ICD-10-CM | POA: Insufficient documentation

## 2015-04-14 DIAGNOSIS — O09899 Supervision of other high risk pregnancies, unspecified trimester: Secondary | ICD-10-CM | POA: Insufficient documentation

## 2015-04-14 DIAGNOSIS — O09529 Supervision of elderly multigravida, unspecified trimester: Secondary | ICD-10-CM | POA: Insufficient documentation

## 2015-04-15 LAB — FENTANYL (GC/LC/MS), URINE
Fentanyl, confirm: NEGATIVE ng/mL (ref <0.5)
NORFENTANYL (GC/MS) CONFIRM: NEGATIVE ng/mL (ref <0.5)

## 2015-04-17 LAB — PRESCRIPTION MONITORING PROFILE (19 PANEL)
AMPHETAMINE/METH: NEGATIVE ng/mL
Barbiturate Screen, Urine: NEGATIVE ng/mL
Benzodiazepine Screen, Urine: NEGATIVE ng/mL
Buprenorphine, Urine: NEGATIVE ng/mL
CANNABINOID SCRN UR: NEGATIVE ng/mL
CARISOPRODOL, URINE: NEGATIVE ng/mL
COCAINE METABOLITES: NEGATIVE ng/mL
Creatinine, Urine: 63.47 mg/dL (ref >20.0)
MDMA URINE: NEGATIVE ng/mL
MEPERIDINE UR: NEGATIVE ng/mL
METHADONE SCREEN, URINE: NEGATIVE ng/mL
Methaqualone: NEGATIVE ng/mL
Nitrites, Initial: NEGATIVE ug/mL
OPIATE SCREEN, URINE: NEGATIVE ng/mL
OXYCODONE SCRN UR: NEGATIVE ng/mL
PHENCYCLIDINE, UR: NEGATIVE ng/mL
Propoxyphene: NEGATIVE ng/mL
TAPENTADOLUR: NEGATIVE ng/mL
TRAMADOL UR: NEGATIVE ng/mL
Zolpidem, Urine: NEGATIVE ng/mL
pH, Initial: 5.9 pH (ref 4.5–8.9)

## 2015-04-18 ENCOUNTER — Ambulatory Visit: Payer: BLUE CROSS/BLUE SHIELD

## 2015-04-18 VITALS — BP 137/89 | HR 84 | Temp 98.5°F

## 2015-04-18 DIAGNOSIS — O10919 Unspecified pre-existing hypertension complicating pregnancy, unspecified trimester: Secondary | ICD-10-CM

## 2015-04-18 LAB — COMPREHENSIVE METABOLIC PANEL
ALT: 7 U/L (ref 6–29)
AST: 14 U/L (ref 10–30)
Albumin: 3.7 g/dL (ref 3.6–5.1)
Alkaline Phosphatase: 30 U/L — ABNORMAL LOW (ref 33–115)
BUN: 8 mg/dL (ref 7–25)
CHLORIDE: 103 mmol/L (ref 98–110)
CO2: 23 mmol/L (ref 20–31)
Calcium: 8.7 mg/dL (ref 8.6–10.2)
Creat: 0.44 mg/dL — ABNORMAL LOW (ref 0.50–1.10)
Glucose, Bld: 92 mg/dL (ref 65–99)
POTASSIUM: 3.9 mmol/L (ref 3.5–5.3)
Sodium: 135 mmol/L (ref 135–146)
TOTAL PROTEIN: 6.6 g/dL (ref 6.1–8.1)
Total Bilirubin: 0.3 mg/dL (ref 0.2–1.2)

## 2015-04-18 LAB — HGB ELECTROPHORESIS REFLEXED REPORT
Hemoglobin A - HGBRFX: 79.4 % — ABNORMAL LOW (ref >96.0)
Hemoglobin A2 - HGBRFX: 3 % (ref 1.8–3.5)
Hemoglobin E: 16.9 % — ABNORMAL HIGH
Hemoglobin F - HGBRFX: 0.7 % (ref <2.0)
Sickle Solubility Test - HGBRFX: NEGATIVE

## 2015-04-18 NOTE — Progress Notes (Signed)
Pt comes in for a blood pressure check. B/P is 137/89 patient is currently taking medicine for her blood pressure. No concerns or question at this time.

## 2015-04-18 NOTE — Addendum Note (Signed)
Addended by: Jill Side on: 04/18/2015 02:50 PM   Modules accepted: Orders

## 2015-04-19 ENCOUNTER — Encounter: Payer: Self-pay | Admitting: Advanced Practice Midwife

## 2015-04-19 DIAGNOSIS — D565 Hemoglobin E-beta thalassemia: Secondary | ICD-10-CM | POA: Insufficient documentation

## 2015-04-24 LAB — PROTEIN, URINE, 24 HOUR: Protein, Urine: <4 mg/dL — ABNORMAL LOW (ref 5–24)

## 2015-04-24 LAB — CREATININE CLEARANCE, URINE, 24 HOUR
CREATININE 24H UR: 1.02 g/(24.h) (ref 0.63–2.50)
CREATININE, URINE: 32 mg/dL (ref 20–320)
Creatinine Clearance: 162 mL/min — ABNORMAL HIGH (ref 75–115)
Creatinine: 0.44 mg/dL — ABNORMAL LOW (ref 0.50–1.10)

## 2015-05-07 ENCOUNTER — Ambulatory Visit (HOSPITAL_COMMUNITY)
Admission: RE | Admit: 2015-05-07 | Discharge: 2015-05-07 | Disposition: A | Discharge status: Home / Self Care | Payer: BLUE CROSS/BLUE SHIELD | Source: Ambulatory Visit | Attending: Advanced Practice Midwife | Admitting: Advanced Practice Midwife

## 2015-05-07 ENCOUNTER — Other Ambulatory Visit: Payer: Self-pay | Admitting: Advanced Practice Midwife

## 2015-05-07 DIAGNOSIS — Z36 Encounter for antenatal screening of mother: Secondary | ICD-10-CM | POA: Diagnosis not present

## 2015-05-07 DIAGNOSIS — D573 Sickle-cell trait: Secondary | ICD-10-CM

## 2015-05-07 DIAGNOSIS — O09212 Supervision of pregnancy with history of pre-term labor, second trimester: Secondary | ICD-10-CM | POA: Insufficient documentation

## 2015-05-07 DIAGNOSIS — O10012 Pre-existing essential hypertension complicating pregnancy, second trimester: Secondary | ICD-10-CM | POA: Diagnosis not present

## 2015-05-07 DIAGNOSIS — O09892 Supervision of other high risk pregnancies, second trimester: Secondary | ICD-10-CM

## 2015-05-07 DIAGNOSIS — Z3A18 18 weeks gestation of pregnancy: Secondary | ICD-10-CM

## 2015-05-07 DIAGNOSIS — O10919 Unspecified pre-existing hypertension complicating pregnancy, unspecified trimester: Secondary | ICD-10-CM

## 2015-05-07 DIAGNOSIS — O09522 Supervision of elderly multigravida, second trimester: Secondary | ICD-10-CM

## 2015-05-07 DIAGNOSIS — Z3689 Encounter for other specified antenatal screening: Secondary | ICD-10-CM

## 2015-05-07 DIAGNOSIS — O09899 Supervision of other high risk pregnancies, unspecified trimester: Secondary | ICD-10-CM

## 2015-05-09 ENCOUNTER — Ambulatory Visit (INDEPENDENT_AMBULATORY_CARE_PROVIDER_SITE_OTHER): Payer: BLUE CROSS/BLUE SHIELD | Admitting: Certified Nurse Midwife

## 2015-05-09 VITALS — BP 140/82 | HR 90 | Wt 178.6 lb

## 2015-05-09 DIAGNOSIS — O09892 Supervision of other high risk pregnancies, second trimester: Secondary | ICD-10-CM

## 2015-05-09 DIAGNOSIS — D573 Sickle-cell trait: Secondary | ICD-10-CM

## 2015-05-09 DIAGNOSIS — O09522 Supervision of elderly multigravida, second trimester: Secondary | ICD-10-CM | POA: Diagnosis not present

## 2015-05-09 DIAGNOSIS — O10912 Unspecified pre-existing hypertension complicating pregnancy, second trimester: Secondary | ICD-10-CM | POA: Diagnosis not present

## 2015-05-09 DIAGNOSIS — O10919 Unspecified pre-existing hypertension complicating pregnancy, unspecified trimester: Secondary | ICD-10-CM

## 2015-05-09 DIAGNOSIS — O99012 Anemia complicating pregnancy, second trimester: Secondary | ICD-10-CM

## 2015-05-09 LAB — POCT URINALYSIS DIP (DEVICE)
Bilirubin Urine: NEGATIVE
Glucose, UA: NEGATIVE mg/dL
Hgb urine dipstick: NEGATIVE
Ketones, ur: NEGATIVE mg/dL
Leukocytes, UA: NEGATIVE
Nitrite: NEGATIVE
Protein, ur: NEGATIVE mg/dL
Specific Gravity, Urine: 1.015 (ref 1.005–1.030)
Urobilinogen, UA: 0.2 mg/dL (ref 0.0–1.0)
pH: 7 (ref 5.0–8.0)

## 2015-05-09 NOTE — Progress Notes (Signed)
Subjective:  Tammy Byrd is a 35 y.o. [id number] at 4144w4d being seen today for ongoing prenatal care.  She is currently monitored for the following issues for this high-risk pregnancy and has Supervision of other high risk pregnancy, antepartum; Chronic hypertension during pregnancy, antepartum; Sickle cell trait (HCC); AMA (advanced maternal age) multigravida 35+; and Hgb E-beta thalassemia (HCC) on her problem list.  Patient reports no complaints.  Contractions: Not present. Vag. Bleeding: None.   . Denies leaking of fluid.   The following portions of the patient's history were reviewed and updated as appropriate: allergies, current medications, past family history, past medical history, past social history, past surgical history and problem list. Problem list updated.  Objective:   Filed Vitals:   05/09/15 0953  BP: 140/82  Pulse: 90  Weight: 178 lb 9.6 oz (81.012 kg)    Fetal Status: Fetal Heart Rate (bpm): 144         General:  Alert, oriented and cooperative. Patient is in no acute distress.  Skin: Skin is warm and dry. No rash noted.   Cardiovascular: Normal heart rate noted  Respiratory: Normal respiratory effort, no problems with respiration noted  Abdomen: Soft, gravid, appropriate for gestational age. Pain/Pressure: Absent     Pelvic: Vag. Bleeding: None     Cervical exam deferred        Extremities: Normal range of motion.  Edema: Trace  Mental Status: Normal mood and affect. Normal behavior. Normal judgment and thought content.   Urinalysis: Urine Protein: Negative Urine Glucose: Negative  Assessment and Plan:  Pregnancy: G3P1102 at 3044w4d  1. Supervision of other high risk pregnancy, antepartum, second trimester Discussed normal anatomy u/s results Information on hemoglobinopathy given Advised pt to have husband tested  2. AMA (advanced maternal age) multigravida 35+, second trimester   3. Chronic hypertension during pregnancy, antepartum   4. Sickle cell trait  (HCC)   Preterm labor symptoms and general obstetric precautions including but not limited to vaginal bleeding, contractions, leaking of fluid and fetal movement were reviewed in detail with the patient. Please refer to After Visit Summary for other counseling recommendations.  No Follow-up on file.   Rhea PinkLori A Damyn Weitzel, CNM

## 2015-05-09 NOTE — Patient Instructions (Signed)

## 2015-05-10 LAB — AFP, QUAD SCREEN
AFP: 29.5 ng/mL
Age Alone: 1:288 {titer}
Curr Gest Age: 17.5 wks.days
Down Syndrome Scr Risk Est: 1:1140 {titer}
HCG, Total: 28.67 IU/mL
INH: 141.2 pg/mL
Interpretation-AFP: NEGATIVE
MoM for AFP: 0.79
MoM for INH: 0.9
MoM for hCG: 1.08
Open Spina bifida: NEGATIVE
Osb Risk: <1:27300 {titer}
Tri 18 Scr Risk Est: NEGATIVE
Trisomy 18 (Edward) Syndrome Interp.: 1:13800 {titer}
uE3 Mom: 0.89
uE3 Value: 1.05 ng/mL

## 2015-06-06 ENCOUNTER — Encounter: Payer: BLUE CROSS/BLUE SHIELD | Admitting: Advanced Practice Midwife

## 2015-06-12 ENCOUNTER — Other Ambulatory Visit: Payer: Self-pay | Admitting: Medical

## 2015-06-12 ENCOUNTER — Ambulatory Visit (HOSPITAL_COMMUNITY)
Admission: RE | Admit: 2015-06-12 | Discharge: 2015-06-12 | Disposition: A | Discharge status: Home / Self Care | Payer: BLUE CROSS/BLUE SHIELD | Source: Ambulatory Visit | Attending: Family Medicine | Admitting: Family Medicine

## 2015-06-12 ENCOUNTER — Ambulatory Visit (INDEPENDENT_AMBULATORY_CARE_PROVIDER_SITE_OTHER): Payer: BLUE CROSS/BLUE SHIELD | Admitting: Obstetrics and Gynecology

## 2015-06-12 VITALS — BP 145/89 | HR 90 | Temp 98.2°F | Wt 187.2 lb

## 2015-06-12 DIAGNOSIS — O09522 Supervision of elderly multigravida, second trimester: Secondary | ICD-10-CM

## 2015-06-12 DIAGNOSIS — O09529 Supervision of elderly multigravida, unspecified trimester: Secondary | ICD-10-CM

## 2015-06-12 DIAGNOSIS — O09892 Supervision of other high risk pregnancies, second trimester: Secondary | ICD-10-CM

## 2015-06-12 DIAGNOSIS — O162 Unspecified maternal hypertension, second trimester: Secondary | ICD-10-CM

## 2015-06-12 DIAGNOSIS — O285 Abnormal chromosomal and genetic finding on antenatal screening of mother: Secondary | ICD-10-CM | POA: Insufficient documentation

## 2015-06-12 LAB — POCT URINALYSIS DIP (DEVICE)
Bilirubin Urine: NEGATIVE
Glucose, UA: NEGATIVE mg/dL
HGB URINE DIPSTICK: NEGATIVE
Ketones, ur: NEGATIVE mg/dL
Leukocytes, UA: NEGATIVE
NITRITE: NEGATIVE
PH: 8.5 — AB (ref 5.0–8.0)
PROTEIN: NEGATIVE mg/dL
SPECIFIC GRAVITY, URINE: 1.015 (ref 1.005–1.030)
UROBILINOGEN UA: 0.2 mg/dL (ref 0.0–1.0)

## 2015-06-12 MED ORDER — LABETALOL HCL 200 MG PO TABS: 200.0000 mg | ORAL_TABLET | Freq: Two times a day (BID) | ORAL | Status: DC

## 2015-06-12 NOTE — Patient Instructions (Signed)

## 2015-06-12 NOTE — Progress Notes (Signed)
Pt c/o lightheadedness this morning Breastfeeding tip of the week reviewed

## 2015-06-12 NOTE — Progress Notes (Signed)
Subjective:  Tammy Byrd is a 35 y.o. [id number] at 8044w3d being seen today for ongoing prenatal care.  She is currently monitored for the following issues for this high-risk pregnancy and has Supervision of other high risk pregnancy, antepartum; Chronic hypertension during pregnancy, antepartum; Sickle cell trait (HCC); AMA (advanced maternal age) multigravida 35+; and Hgb E-beta thalassemia (HCC) on her problem list. Pt with HbE. States FOB was tested for hemoglobinopathy and normal with prior pregnancy. Quad sceen was neg. Desires genetic counseling. Reports white coat HTN. Ate several packs of ramon noodles last night and has other dietary indiscretions. Taking labaetalol and BASA. Patient reports single episode of feeling light-headed.     Contractions: Not present. Vag. Bleeding: None.  Movement: Present. Denies leaking of fluid.   The following portions of the patient's history were reviewed and updated as appropriate: allergies, current medications, past family history, past medical history, past social history, past surgical history and problem list. Problem list updated.  Objective:   Filed Vitals:   06/12/15 0803 06/12/15 0807  BP: 143/85 145/89  Pulse: 90   Temp: 98.2 F (36.8 C)   Weight: 187 lb 3.2 oz (84.913 kg)     Fetal Status: Fetal Heart Rate (bpm): 140   Movement: Present     General:  Alert, oriented and cooperative. Patient is in no acute distress.  Skin: Skin is warm and dry. No rash noted.   Cardiovascular: Normal heart rate noted  Respiratory: Normal respiratory effort, no problems with respiration noted  Abdomen: Soft, gravid, appropriate for gestational age. Pain/Pressure: Present     Pelvic: Vag. Bleeding: None     Cervical exam deferred        Extremities: Normal range of motion.  Edema: Trace  Mental Status: Normal mood and affect. Normal behavior. Normal judgment and thought content.   Urinalysis:      Assessment and Plan:  Pregnancy: G3P1102 at 5944w3d  1. AMA  (advanced maternal age) multigravida 35+, second trimester Borderline control - AMB referral to maternal fetal medicine for genetic counseling. Appointment for today  2. Hypertension affecting pregnancy in second trimester, antepartum Reviewed proper diet and refilled labetalol. Report if persistent dizziness. Recheck BP 2 wks.   Preterm labor symptoms and general obstetric precautions including but not limited to vaginal bleeding, contractions, leaking of fluid and fetal movement were reviewed in detail with the patient. Please refer to After Visit Summary for other counseling recommendations.  Return in about 2 weeks (around 06/26/2015).   Danae Orleanseirdre C Kace Hartje, CNM

## 2015-06-12 NOTE — Progress Notes (Signed)
Genetic Counseling  High-Risk Gestation Note  Appointment Date:  06/12/2015 Referred By: Reva BoresPratt, Tanya S, MD Date of Birth:  03/01/1980 Partner:  Horris Latinohiv Mean   Pregnancy History: [id number] Estimated Date of Delivery: 10/06/15 Estimated Gestational Age: 4874w3d Attending: Particia NearingMartha Decker, MD   Ms. Emeline GinsKrim Bilotti was seen for genetic counseling because of a maternal age of 35 y.o. 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  Discussed autosomal recessive inheritance of hemoglobinopathies  Risk to fetus depends upon carrier status of father of the pregnancy and the severity of the variant present, if he is a carrier  Carrier screening available to father of the pregnancy via hemoglobin electrophoresis, complete blood count, and ferritin studies  Provided patient with contact information for [facility] and [facility] and discussed that we can also facilitate screening for him, if desired   Hemoglobin electrophoresis result for patient suggested that she may also carry alpha thalassemia trait  The pattern on hemoglobin electrophoresis result could also be due to iron deficiency in the patient  Reviewed autosomal recessive inheritance that risk to fetus depends upon carrier status of father of the pregnancy  Molecular testing available to patient to confirm alpha thal carrier status and to assess if deletions are in cis or trans (if she is a carrier), which also can impact risk to fetus  Patient declined testing for herself at this time and first plans to pursue carrier screening for father of the pregnancy   Discussed that prenatal diagnosis for hemoglobinopathy via amniocentesis would be available in the case that both parents were identified to be carriers  Patient stated that she would not be interested in  prenatal diagnosis via amniocentesis for these conditions, even in the event that the father of the pregnancy is identified to be a carrier   Reviewed maternal age related risks for fetal aneuploidy  Quad screen within normal range, reduction in risks for fetal Down syndrome, trisomy 3118, and ONTDs  Detailed ultrasound previously performed 05/07/15 within normal limits  Patient declined NIPS and amniocentesis      Hemoglobin electrophoresis was performed for the patient as routine screening given the increased chance for hemoglobinopathies in individuals with [place] ancestry and given the patient's report of being told in previous pregnancies that she has sickle cell trait. Hemoglobin electrophoresis indicated the presence of hemoglobin E. Hemoglobin is the oxygen-carrying pigment of red blood cells and is comprised of beta chains and alpha chains. The type of hemoglobin we have is determined by inheritance. Most people typically inherit hemoglobin A, denoted as Hb AA. Hemoglobin E is a variant form of hemoglobin due to a difference in production of the beta globin subunit of hemoglobin. If an individual inherits hemoglobin A from one parent and hemoglobin E from the other parent, that individual has hemoglobin E trait (noted as Hb AE), which is not typically associated with health problems for the individual.   We reviewed genes and discussed the autosomal recessive inheritance of hemoglobinopathies. The chance for a hemoglobinopathy in the pregnancy depends upon the type of hemoglobin that the father of the pregnancy has. When both parents are carriers of an autosomal recessive condition, each pregnancy together has a 1 in 4 (25%) chance to inherit both copies of the changed gene. If one parent is a carrier but the other is not, the pregnancy would not  be at increased risk for the autosomal recessive condition but would have a 1 in 2 (50%) chance to carry the same gene change. Hemoglobin E is  a beta globin chain variant and can lead to hemoglobinopathies of varying degrees of severity when inherited in combination with another beta chain variant. For example, Hb EE is generally benign, meaning we would not expect individuals with this hemoglobin to have associated health problems, but when Hb E is inherited with beta thalassemia trait, clinical symptoms may be similar to beta thalassemia major.   The patient reported that the father of the pregnancy has predominantly [place] ancestry, with no known family history of hemoglobin variants. He has no family history of hemoglobin variants, and consanguinity to the patient was denied. The couple also have two children together who are reportedly healthy. Prior to carrier screening for the father of the pregnancy, his chance of being a carrier hemoglobin variants would be the following: up to approximately 1 in 4 (25%) for hemoglobin E and approximately 1 in 33 (3%) for beta thalassemia trait. Thus, prior to carrier screening for the father of the pregnancy, the risk for a potentially significant hemoglobinopathy, such as hemoglobin E-beta thalassemia, is approximately 1 in 132.   We discussed that carrier screening is available to him via hemoglobin electrophoresis, complete blood count (to assess MCV), and serum ferritin. This can be facilitated through our office, her OB office, or through [facility] and [facility] [place]. Additionally, we discussed that [place] assess for hemoglobinopathies on the newborn screening panel. We discussed that prenatal ultrasound would not be expected to provide information about the status of the fetus regarding beta globin chain hemoglobinopathies, given that physical differences are not expected to be associated prenatally with these conditions.  We discussed that when both parents are identified to be carriers of a hemoglobin variant, prenatal diagnosis would be available via  amniocentesis if the specific molecular changes in the gene(s) are first identified in the parents. We reviewed the risks, benefits, and limitations of amniocentesis including the associated risk for complications. Ms. Barona indicated that she would not be interested in amniocentesis in pregnancy, even if the father of the pregnancy is identified to be a carrier of a significant hemoglobin variant. She indicated that the father of the pregnancy would likely pursue screening through [facility] and [facility] [place] and asked for their contact information again, which was provided to her today.   We discussed that Ms. Wilms hemoglobin electrophoresis results suggested that she may also carry alpha thalassemia trait. This was suggested given that the percentage of Hemoglobin E she was identified to carry is lower than the typical amount seen in individuals heterozygous for hemoglobin E. Per the laboratory report, this decreased amount of hemoglobin E could be due to the additional presence of alpha thalassemia trait or due to iron deficiency in the patient. Alpha thalassemia is different in its inheritance compared to other hemoglobinopathies as there are two alpha globin genes on each chromosome 16 (aa/aa).  A person can be a carrier of one alpha gene mutation (aa/a-), also referred to as a "silent carrier" or of more than one mutation.  A person who carriers two alpha globin gene mutations can either carry them in cis (both on the same chromosome, denoted as aa/--) or in trans (on different chromosomes, denoted as a-/a-) .  We discussed that alpha thalassemia carriers of two mutations who have African ancestry  are more likely to have a trans arrangements (a-/a-), and individuals with Asian ancestry who are alpha thalassemia carriers usually have a cis arrangement (aa/--) of their alpha globin gene mutations.    We discussed different forms of alpha thalassemia. The most severe form of  alpha thalassemia, hydrops fetalis with Hb Barts, is associated with an absence of alpha globin chain synthesis as a result of deletions of all four alpha globin genes (--/--).  A pregnancy would only be at risk for hemoglobin Bart hydrops fetalis if both parents are carriers of alpha thalassemia in cis (aa/--). Hemoglobin H (HbH) disease is caused by three deleted or dysfunctioning alpha globin alleles (a-/--) and is characterized by microcytic hypochromic hemolytic anemia, hepatosplenomegaly, mild jaundice, and sometimes thalassemia-like bone changes. A pregnancy would be at risk for this condition if one parents is a carrier of alpha thalassemia in cis (aa/--), and the other is a carrier in trans (a-/a-), or if one carries in cis and the other is a silent carrier (aa/a-). When both parents carry alpha thalassemia in trans (a-/a-), a pregnancy together would be expected to have alpha thalassemia trait but not increased risk to be affected. We discussed that molecular (genetic) testing is available to Ms. Resendes for the HBA1 and HB2 genes to confirm if she is a carrier of alpha thalassemia trait and if so, whether the deletions are in cis or trans. Given that the risk to the pregnancy would also depend on carrier status for the father of the pregnancy, we discussed carrier screening via hemoglobin electrophoresis, CBC, and serum ferritin studies. Ms. Henri declined further testing for herself at this time, given that the father of the pregnancy plans to first pursue carrier screening for himself.   She was counseled regarding maternal age and the association with risk for chromosome conditions due to nondisjunction with aging of the ova.   We reviewed chromosomes, nondisjunction, and the associated 1 in 141 risk for fetal aneuploidy related to a maternal age of 35 y.o. at [redacted]w[redacted]d gestation.  She was counseled that the risk for aneuploidy decreases as gestational age increases, accounting for those pregnancies which  spontaneously abort.  We specifically discussed Down syndrome (trisomy 80), trisomies 75 and 77, and sex chromosome aneuploidies (47,XXX and 47,XXY) including the common features and prognoses of each.   Ms. Kubota previously had Quad screen performed through [facility], which was within normal range. We reviewed the reduction in risks for fetal Down syndrome (1 in 1140), trisomy 18 (1 in 13,800), and open neural tube defects (< 1 in 27,300). She was counseled that screening tests are used to modify a patient's a priori risk for aneuploidy, typically based on age. This provides a pregnancy specific risk assessment but is not diagnostic. It also does not assess for all chromosome conditions. We reviewed the benefits and limitations of targeted ultrasound as a screening tool for fetal aneuploidy. Detailed [facility] and was within normal limits. She understands that ultrasound cannot diagnose or rule out chromosome conditions and cannot diagnose or rule out all birth defects or genetic conditions.   We reviewed additional available screening option for chromosome conditions of  noninvasive prenatal screening (NIPS)/cell free DNA (cfDNA) testing.  We reviewed the benefits and limitations. Specifically, we discussed the conditions for which each test screens, the detection rates, and false positive rates of each. She was also counseled regarding diagnostic testing via amniocentesis. We reviewed the approximate 1  in 300-500 risk for complications for amniocentesis, including spontaneous pregnancy loss or preterm labor and delivery. We discussed the possible results that the tests might provide including: positive, negative, unanticipated, and no result. Finally, they were counseled regarding the cost of each option and potential out of pocket expenses. After consideration of all the options, she declined NIPS and amniocentesis, stating that she was  comfortable with the risk assessment provided by Quad screen and detailed ultrasound.  She understands that screening tests cannot rule out all birth defects or genetic syndromes. The patient was advised of this limitation and states she still does not want additional testing at this time.   Both family histories were reviewed and found to be noncontributory for birth defects, intellectual disability, and known genetic conditions. Without further information regarding the provided family history, an accurate genetic risk cannot be calculated. Further genetic counseling is warranted if more information is obtained.  Ms. Almeta Geisel denied exposure to environmental toxins or chemical agents. She denied the use of alcohol, tobacco or street drugs. She denied significant viral illnesses during the course of her pregnancy. Her medical and surgical histories were contributory for hypertension, for which she is currently treated with labetalol.   I counseled Ms. Winnie Umali  regarding the above risks and available options.  The approximate face-to-face time with the genetic counselor was 45 minutes.  Quinn Plowman, MS,  Certified [employer] 06/12/2015

## 2015-06-13 ENCOUNTER — Other Ambulatory Visit: Payer: Self-pay | Admitting: Medical

## 2015-06-13 DIAGNOSIS — O162 Unspecified maternal hypertension, second trimester: Secondary | ICD-10-CM

## 2015-06-26 ENCOUNTER — Ambulatory Visit (INDEPENDENT_AMBULATORY_CARE_PROVIDER_SITE_OTHER): Payer: BLUE CROSS/BLUE SHIELD | Admitting: Advanced Practice Midwife

## 2015-06-26 ENCOUNTER — Encounter: Payer: Self-pay | Admitting: Obstetrics & Gynecology

## 2015-06-26 VITALS — BP 154/57 | HR 86 | Wt 186.8 lb

## 2015-06-26 DIAGNOSIS — O10912 Unspecified pre-existing hypertension complicating pregnancy, second trimester: Secondary | ICD-10-CM

## 2015-06-26 DIAGNOSIS — O10919 Unspecified pre-existing hypertension complicating pregnancy, unspecified trimester: Secondary | ICD-10-CM

## 2015-06-26 DIAGNOSIS — O09522 Supervision of elderly multigravida, second trimester: Secondary | ICD-10-CM

## 2015-06-26 LAB — POCT URINALYSIS DIP (DEVICE)
BILIRUBIN URINE: NEGATIVE
Glucose, UA: NEGATIVE mg/dL
HGB URINE DIPSTICK: NEGATIVE
KETONES UR: NEGATIVE mg/dL
Leukocytes, UA: NEGATIVE
Nitrite: NEGATIVE
PH: 7 (ref 5.0–8.0)
Protein, ur: NEGATIVE mg/dL
SPECIFIC GRAVITY, URINE: 1.01 (ref 1.005–1.030)
Urobilinogen, UA: 0.2 mg/dL (ref 0.0–1.0)

## 2015-06-26 NOTE — Patient Instructions (Signed)

## 2015-06-27 ENCOUNTER — Telehealth: Payer: Self-pay

## 2015-06-27 ENCOUNTER — Encounter: Payer: Self-pay | Admitting: Advanced Practice Midwife

## 2015-06-27 ENCOUNTER — Other Ambulatory Visit: Payer: Self-pay

## 2015-06-27 DIAGNOSIS — O0992 Supervision of high risk pregnancy, unspecified, second trimester: Secondary | ICD-10-CM

## 2015-06-27 NOTE — Progress Notes (Signed)
Subjective:  Tammy Byrd is a 35 y.o. [id number] at 6271w4d being seen today for ongoing prenatal care.  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  Patient reports no complaints.   .  .  Movement: Present. Denies leaking of fluid.   The following portions of the patient's history were reviewed and updated as appropriate: allergies, current medications, past family history, past medical history, past social history, past surgical history and problem list. Problem list updated.  Objective:   Filed Vitals:   06/26/15 0938  BP: 154/57  Pulse: 86  Weight: 186 lb 12.8 oz (84.732 kg)    Fetal Status: Fetal Heart Rate (bpm): 135   Movement: Present     General:  Alert, oriented and cooperative. Patient is in no acute distress.  Skin: Skin is warm and dry. No rash noted.   Cardiovascular: Normal heart rate noted  Respiratory: Normal respiratory effort, no problems with respiration noted  Abdomen: Soft, gravid, appropriate for gestational age. Pain/Pressure: Present     Pelvic:       Cervical exam deferred        Extremities: Normal range of motion.     Mental Status: Normal mood and affect. Normal behavior. Normal judgment and thought content.   Urinalysis:      Assessment and Plan:  Pregnancy: G3P1102 at 3771w4d  1. Chronic hypertension during pregnancy, antepartum      Discussed borderline BP with Dr Macon LargeAnyanwu.  She recommends increasing Labetalol to 300mg  bid, but pt states BPs have been normal at home. She wants to wait and see how BPs run this week  Preterm labor symptoms and general obstetric precautions including but not limited to vaginal bleeding, contractions, leaking of  fluid and fetal movement were reviewed in detail with the patient. Please refer to After Visit Summary for other counseling recommendations.  Return in about 4 weeks (around 07/24/2015) for High Risk Clinic.   Aviva SignsMarie L Dareth Andrew, CNM

## 2015-06-27 NOTE — Telephone Encounter (Signed)
[facility]

## 2015-06-29 ENCOUNTER — Ambulatory Visit (HOSPITAL_COMMUNITY): Payer: BLUE CROSS/BLUE SHIELD

## 2015-07-11 ENCOUNTER — Other Ambulatory Visit: Payer: Self-pay | Admitting: Advanced Practice Midwife

## 2015-07-11 ENCOUNTER — Ambulatory Visit (HOSPITAL_COMMUNITY)
Admission: RE | Admit: 2015-07-11 | Discharge: 2015-07-11 | Disposition: A | Discharge status: Home / Self Care | Payer: BLUE CROSS/BLUE SHIELD | Source: Ambulatory Visit | Attending: Advanced Practice Midwife | Admitting: Advanced Practice Midwife

## 2015-07-11 ENCOUNTER — Encounter (HOSPITAL_COMMUNITY): Payer: Self-pay

## 2015-07-11 VITALS — BP 144/86 | HR 89 | Wt 190.4 lb

## 2015-07-11 DIAGNOSIS — O10012 Pre-existing essential hypertension complicating pregnancy, second trimester: Secondary | ICD-10-CM | POA: Insufficient documentation

## 2015-07-11 DIAGNOSIS — O09522 Supervision of elderly multigravida, second trimester: Secondary | ICD-10-CM | POA: Diagnosis not present

## 2015-07-11 DIAGNOSIS — O09292 Supervision of pregnancy with other poor reproductive or obstetric history, second trimester: Secondary | ICD-10-CM | POA: Diagnosis not present

## 2015-07-11 DIAGNOSIS — O10919 Unspecified pre-existing hypertension complicating pregnancy, unspecified trimester: Secondary | ICD-10-CM

## 2015-07-11 DIAGNOSIS — Z3A27 27 weeks gestation of pregnancy: Secondary | ICD-10-CM | POA: Diagnosis not present

## 2015-07-11 DIAGNOSIS — O0992 Supervision of high risk pregnancy, unspecified, second trimester: Secondary | ICD-10-CM

## 2015-07-11 DIAGNOSIS — Z862 Personal history of diseases of the blood and blood-forming organs and certain disorders involving the immune mechanism: Secondary | ICD-10-CM | POA: Diagnosis not present

## 2015-07-11 DIAGNOSIS — O09212 Supervision of pregnancy with history of pre-term labor, second trimester: Secondary | ICD-10-CM | POA: Insufficient documentation

## 2015-07-26 ENCOUNTER — Ambulatory Visit (INDEPENDENT_AMBULATORY_CARE_PROVIDER_SITE_OTHER): Payer: BLUE CROSS/BLUE SHIELD | Admitting: Obstetrics and Gynecology

## 2015-07-26 ENCOUNTER — Encounter: Payer: Self-pay | Admitting: Obstetrics and Gynecology

## 2015-07-26 VITALS — BP 152/87 | HR 89 | Wt 190.7 lb

## 2015-07-26 DIAGNOSIS — O09893 Supervision of other high risk pregnancies, third trimester: Secondary | ICD-10-CM

## 2015-07-26 DIAGNOSIS — O285 Abnormal chromosomal and genetic finding on antenatal screening of mother: Secondary | ICD-10-CM

## 2015-07-26 DIAGNOSIS — Z23 Encounter for immunization: Secondary | ICD-10-CM

## 2015-07-26 DIAGNOSIS — O10919 Unspecified pre-existing hypertension complicating pregnancy, unspecified trimester: Secondary | ICD-10-CM

## 2015-07-26 DIAGNOSIS — O09523 Supervision of elderly multigravida, third trimester: Secondary | ICD-10-CM | POA: Diagnosis not present

## 2015-07-26 DIAGNOSIS — O10913 Unspecified pre-existing hypertension complicating pregnancy, third trimester: Secondary | ICD-10-CM

## 2015-07-26 LAB — POCT URINALYSIS DIP (DEVICE)
BILIRUBIN URINE: NEGATIVE
Glucose, UA: NEGATIVE mg/dL
HGB URINE DIPSTICK: NEGATIVE
KETONES UR: NEGATIVE mg/dL
NITRITE: NEGATIVE
Protein, ur: NEGATIVE mg/dL
Specific Gravity, Urine: 1.01 (ref 1.005–1.030)
Urobilinogen, UA: 0.2 mg/dL (ref 0.0–1.0)
pH: 7 (ref 5.0–8.0)

## 2015-07-26 LAB — CBC
HEMATOCRIT: 32.4 % — AB (ref 35.0–45.0)
HEMOGLOBIN: 10.6 g/dL — AB (ref 11.7–15.5)
MCH: 22.4 pg — AB (ref 27.0–33.0)
MCHC: 32.7 g/dL (ref 32.0–36.0)
MCV: 68.5 fL — ABNORMAL LOW (ref 80.0–100.0)
Platelets: 308 10*3/uL (ref 140–400)
RBC: 4.73 MIL/uL (ref 3.80–5.10)
RDW: 18.2 % — ABNORMAL HIGH (ref 11.0–15.0)
WBC: 10.2 10*3/uL (ref 3.8–10.8)

## 2015-07-26 MED ORDER — LABETALOL HCL 300 MG PO TABS: 300.0000 mg | ORAL_TABLET | Freq: Two times a day (BID) | ORAL | Status: DC

## 2015-07-26 MED ORDER — ASPIRIN EC 81 MG PO TBEC: 81.0000 mg | DELAYED_RELEASE_TABLET | Freq: Every day | ORAL | Status: DC

## 2015-07-26 MED ORDER — TETANUS-DIPHTH-ACELL PERTUSSIS 5-2.5-18.5 LF-MCG/0.5 IM SUSP
0.5000 mL | Freq: Once | INTRAMUSCULAR | Status: AC
  Administered 2015-07-26: 0.5 mL via INTRAMUSCULAR

## 2015-07-26 NOTE — Addendum Note (Signed)
Addended by: Catalina AntiguaONSTANT, Greydon Betke on: 07/26/2015 11:28 AM   Modules accepted: Orders

## 2015-07-26 NOTE — Progress Notes (Signed)
Subjective:  Tammy Byrd is a 35 y.o. [id number] at 3838w5d being seen today for ongoing prenatal care.  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  Patient reports no complaints.  Contractions: Not present. Vag. Bleeding: None.  Movement: Present. Denies leaking of fluid.   The following portions of the patient's history were reviewed and updated as appropriate: allergies, current medications, past family history, past medical history, past social history, past surgical history and problem list. Problem list updated.  Objective:   Filed Vitals:   07/26/15 1017  BP: 152/87  Pulse: 89  Weight: 190 lb 11.2 oz (86.501 kg)    Fetal Status: Fetal Heart Rate (bpm): 150 Fundal Height: 30 cm Movement: Present     General:  Alert, oriented and cooperative. Patient is in no acute distress.  Skin: Skin is warm and dry. No rash noted.   Cardiovascular: Normal heart rate noted  Respiratory: Normal respiratory effort, no problems with respiration noted  Abdomen: Soft, gravid, appropriate for gestational age. Pain/Pressure: Present     Pelvic: Vag. Bleeding: None     Cervical exam deferred        Extremities: Normal range of motion.  Edema: Trace  Mental Status: Normal mood and affect. Normal behavior. Normal judgment and thought content.   Urinalysis: Urine Protein: Negative Urine Glucose: Negative  Assessment and Plan:  Pregnancy: G3P1102 at 6538w5d  1. AMA (advanced maternal age) multigravida 35+, third trimester Normal quad  2. 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 Chronic hypertension during pregnancy, antepartum Patient has not  been taking her labetalol in the past 2 days. Refill provided ASA prescribed as well Follow up growth ultrasound on 6/21  4. Supervision of other high risk pregnancy, antepartum, third trimester Patient is doing well without complaints  Preterm labor symptoms and general obstetric precautions including but not limited to vaginal bleeding, contractions, leaking of fluid and fetal movement were reviewed in detail with the patient. Please refer to After Visit Summary for other counseling recommendations.  Return in about 2 weeks (around 08/09/2015).   Catalina AntiguaPeggy Terez Montee, MD

## 2015-07-26 NOTE — Progress Notes (Signed)
28 wk packet given  28 wk labs today  tdap given  Pt reports dose change labetalol 200mg  to 300mg  has not taken medication in two days.  Needs refill on new dosage.

## 2015-07-27 LAB — GLUCOSE TOLERANCE, 1 HOUR (50G) W/O FASTING: Glucose, 1 Hr, gestational: 194 mg/dL — ABNORMAL HIGH (ref <140)

## 2015-07-27 LAB — HIV ANTIBODY (ROUTINE TESTING W REFLEX): HIV: NONREACTIVE

## 2015-07-27 LAB — RPR

## 2015-07-29 ENCOUNTER — Encounter: Payer: Self-pay | Admitting: Obstetrics and Gynecology

## 2015-07-29 DIAGNOSIS — O24419 Gestational diabetes mellitus in pregnancy, unspecified control: Secondary | ICD-10-CM | POA: Insufficient documentation

## 2015-07-31 ENCOUNTER — Telehealth: Payer: Self-pay | Admitting: *Deleted

## 2015-07-31 NOTE — Telephone Encounter (Signed)
-----   Message from Catalina AntiguaPeggy Constant, MD sent at 07/29/2015  8:06 AM EDT ----- Please inform patient of diagnosis of GDM. She needs to meet with diabetic educator before her next appointment  Thanks  Peggy

## 2015-07-31 NOTE — Telephone Encounter (Addendum)
Called pt and left message stating that I am calling with test results information. Please call back and leave a message stating whether we may leave a detailed message on your voice mail.  *Pt needs appt to see Maggie only on 6/19, then return as scheduled on 6/22  6/14  1525  Spoke w/pt and informed her of abnormal GTT result indicating she has Gestational Diabetes. Pt needs to meet with Diabetes Educator on 6/19 @ 0830 to learn how to test her blood sugar and diabetic diet. She will also need to keep prenatal visit appt as scheduled on 6/22.  Pt voiced understanding and agreed to appts as stated.

## 2015-08-06 ENCOUNTER — Ambulatory Visit: Payer: BLUE CROSS/BLUE SHIELD | Admitting: *Deleted

## 2015-08-06 ENCOUNTER — Encounter: Payer: BLUE CROSS/BLUE SHIELD | Attending: Obstetrics & Gynecology | Admitting: Dietician

## 2015-08-06 DIAGNOSIS — O24419 Gestational diabetes mellitus in pregnancy, unspecified control: Secondary | ICD-10-CM | POA: Insufficient documentation

## 2015-08-06 DIAGNOSIS — O10912 Unspecified pre-existing hypertension complicating pregnancy, second trimester: Secondary | ICD-10-CM | POA: Insufficient documentation

## 2015-08-06 MED ORDER — ACCU-CHEK FASTCLIX LANCETS MISC: 1.0000 | Freq: Four times a day (QID) | Status: DC

## 2015-08-06 MED ORDER — GLUCOSE BLOOD VI STRP: ORAL_STRIP | Status: DC

## 2015-08-06 MED ORDER — ACCU-CHEK NANO SMARTVIEW W/DEVICE KIT: 1.0000 | PACK | Freq: Once | Status: DC

## 2015-08-06 NOTE — Progress Notes (Signed)
08/06/15  Diabetes Educator G3P2 lady with no previous history of diabetes.   Seen for GDM  Instruction and Meter Instruction Review of GDM and the post-delivery measures for weight loss and self-care for preventing the onset/nmonitoring for possible Type 2 DM. Provided demonstration for use of the Accu-chek Aviva Plus meter. Check per M. Dru Laurel revealed a fasting at 8:30 am of 123 mg/dl.   Nursing to electronically order the meter at her local pharmacy. Instructed to monitor fasting and 2 hr post meal glucose levels. Instructed to record all glucose results and bring glucose log to all clinic appointments. Instructed to walk for 30 minutes daily in the coolest part of the day for glucose control.  If too hot/humid, try walking in a big box store for 30 minutes. To see nutrition.  Will plan to follow as needed.

## 2015-08-08 ENCOUNTER — Ambulatory Visit (HOSPITAL_COMMUNITY)
Admission: RE | Admit: 2015-08-08 | Discharge: 2015-08-08 | Disposition: A | Discharge status: Home / Self Care | Payer: BLUE CROSS/BLUE SHIELD | Source: Ambulatory Visit | Attending: Advanced Practice Midwife | Admitting: Advanced Practice Midwife

## 2015-08-08 ENCOUNTER — Encounter (HOSPITAL_COMMUNITY): Payer: Self-pay

## 2015-08-08 ENCOUNTER — Other Ambulatory Visit (HOSPITAL_COMMUNITY): Payer: Self-pay | Admitting: Maternal and Fetal Medicine

## 2015-08-08 VITALS — BP 145/92 | HR 82 | Wt 193.4 lb

## 2015-08-08 DIAGNOSIS — O09893 Supervision of other high risk pregnancies, third trimester: Secondary | ICD-10-CM

## 2015-08-08 DIAGNOSIS — O09523 Supervision of elderly multigravida, third trimester: Secondary | ICD-10-CM

## 2015-08-08 DIAGNOSIS — Z3A31 31 weeks gestation of pregnancy: Secondary | ICD-10-CM | POA: Diagnosis not present

## 2015-08-08 DIAGNOSIS — O09213 Supervision of pregnancy with history of pre-term labor, third trimester: Secondary | ICD-10-CM | POA: Diagnosis not present

## 2015-08-08 DIAGNOSIS — O10919 Unspecified pre-existing hypertension complicating pregnancy, unspecified trimester: Secondary | ICD-10-CM

## 2015-08-08 DIAGNOSIS — Z862 Personal history of diseases of the blood and blood-forming organs and certain disorders involving the immune mechanism: Secondary | ICD-10-CM

## 2015-08-08 DIAGNOSIS — Z3A35 35 weeks gestation of pregnancy: Secondary | ICD-10-CM

## 2015-08-08 DIAGNOSIS — O24419 Gestational diabetes mellitus in pregnancy, unspecified control: Secondary | ICD-10-CM

## 2015-08-08 DIAGNOSIS — O10013 Pre-existing essential hypertension complicating pregnancy, third trimester: Secondary | ICD-10-CM | POA: Diagnosis not present

## 2015-08-08 DIAGNOSIS — O285 Abnormal chromosomal and genetic finding on antenatal screening of mother: Secondary | ICD-10-CM

## 2015-08-09 ENCOUNTER — Ambulatory Visit (INDEPENDENT_AMBULATORY_CARE_PROVIDER_SITE_OTHER): Payer: BLUE CROSS/BLUE SHIELD | Admitting: Obstetrics & Gynecology

## 2015-08-09 VITALS — BP 147/88 | HR 91 | Wt 191.7 lb

## 2015-08-09 DIAGNOSIS — O09893 Supervision of other high risk pregnancies, third trimester: Secondary | ICD-10-CM

## 2015-08-09 DIAGNOSIS — O09523 Supervision of elderly multigravida, third trimester: Secondary | ICD-10-CM

## 2015-08-09 DIAGNOSIS — O24419 Gestational diabetes mellitus in pregnancy, unspecified control: Secondary | ICD-10-CM

## 2015-08-09 DIAGNOSIS — O10913 Unspecified pre-existing hypertension complicating pregnancy, third trimester: Secondary | ICD-10-CM

## 2015-08-09 DIAGNOSIS — O10919 Unspecified pre-existing hypertension complicating pregnancy, unspecified trimester: Secondary | ICD-10-CM

## 2015-08-09 LAB — POCT URINALYSIS DIP (DEVICE)
Bilirubin Urine: NEGATIVE
Glucose, UA: NEGATIVE mg/dL
HGB URINE DIPSTICK: NEGATIVE
Ketones, ur: NEGATIVE mg/dL
LEUKOCYTES UA: NEGATIVE
NITRITE: NEGATIVE
PH: 7.5 (ref 5.0–8.0)
PROTEIN: NEGATIVE mg/dL
SPECIFIC GRAVITY, URINE: 1.015 (ref 1.005–1.030)
UROBILINOGEN UA: 0.2 mg/dL (ref 0.0–1.0)

## 2015-08-09 MED ORDER — LABETALOL HCL 300 MG PO TABS: 300.0000 mg | ORAL_TABLET | Freq: Three times a day (TID) | ORAL | Status: DC

## 2015-08-09 NOTE — Progress Notes (Signed)
Pt states that her blood pressure is high because she is scared of sitting on exam table.

## 2015-08-09 NOTE — Progress Notes (Signed)
Subjective:  Tammy Byrd is a 35 y.o. [id number] at 1080w5d being seen today for ongoing prenatal care.  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  Patient reports no complaints.  Contractions: Not present. Vag. Bleeding: None.  Movement: Present. Denies leaking of fluid.   The following portions of the patient's history were reviewed and updated as appropriate: allergies, current medications, past family history, past medical history, past social history, past surgical history and problem list. Problem list updated.  Objective:   Filed Vitals:   08/09/15 1108 08/09/15 1112  BP: 154/90 147/88  Pulse: 91   Weight: 191 lb 11.2 oz (86.955 kg)     Fetal Status: Fetal Heart Rate (bpm): 152 Fundal Height: 33 cm Movement: Present     General:  Alert, oriented and cooperative. Patient is in no acute distress.  Skin: Skin is warm and dry. No rash noted.   Cardiovascular: Normal heart rate noted  Respiratory: Normal respiratory effort, no problems with respiration noted  Abdomen: Soft, gravid, appropriate for gestational age. Pain/Pressure: Absent     Pelvic: Cervical exam deferred        Extremities: Normal range of motion.  Edema: Trace  Mental Status: Normal mood and affect. Normal behavior. Normal judgment and thought content.   Urinalysis: Urine Protein: Negative Urine Glucose: Negative  Assessment and Plan:  Pregnancy: G3P1102 at 4880w5d  1. Chronic hypertension during pregnancy, antepartum 08/08/15 EFW 67%.  Will start 2x/week testing at 32 weeks. - labetalol (NORMODYNE) 300 MG tablet; Take 1 tablet (300 mg total) by  mouth 3 (three) times daily.  Dispense: 90 tablet; Refill: 3  2. Gestational diabetes mellitus (GDM) affecting pregnancy Has not received meter, will go to pharmacy today.  Will check BS next visit.  3. Supervision of other high risk pregnancy, antepartum, third trimester Preterm labor symptoms and general obstetric precautions including but not limited to vaginal bleeding, contractions, leaking of fluid and fetal movement were reviewed in detail with the patient. Please refer to After Visit Summary for other counseling recommendations.  Return in about 4 days (around 08/13/2015) for NST only.  08/16/15 OB visit, NST and AFI.   Tereso NewcomerUgonna A Anyanwu, MD

## 2015-08-09 NOTE — Patient Instructions (Signed)
Return to clinic for any scheduled appointments or obstetric concerns, or go to [facility] for evaluation  

## 2015-08-10 ENCOUNTER — Telehealth: Payer: Self-pay | Admitting: General Practice

## 2015-08-10 DIAGNOSIS — O24419 Gestational diabetes mellitus in pregnancy, unspecified control: Secondary | ICD-10-CM

## 2015-08-10 MED ORDER — ACCU-CHEK AVIVA PLUS W/DEVICE KIT: 1.0000 | PACK | Freq: Once | Status: DC

## 2015-08-10 MED ORDER — GLUCOSE BLOOD VI STRP: ORAL_STRIP | Status: DC

## 2015-08-10 NOTE — Telephone Encounter (Signed)
Patient called into front office stating her test strips aren't covered by insurance and she needs something else sent in. Told patient I will fix her prescription for her & she can pick it up today. Patient verbalized understanding & had no questions

## 2015-08-14 ENCOUNTER — Ambulatory Visit (INDEPENDENT_AMBULATORY_CARE_PROVIDER_SITE_OTHER): Payer: BLUE CROSS/BLUE SHIELD | Admitting: General Practice

## 2015-08-14 VITALS — BP 149/89 | HR 91

## 2015-08-14 DIAGNOSIS — O10913 Unspecified pre-existing hypertension complicating pregnancy, third trimester: Secondary | ICD-10-CM | POA: Diagnosis not present

## 2015-08-14 DIAGNOSIS — I10 Essential (primary) hypertension: Secondary | ICD-10-CM

## 2015-08-14 DIAGNOSIS — O24410 Gestational diabetes mellitus in pregnancy, diet controlled: Secondary | ICD-10-CM | POA: Diagnosis not present

## 2015-08-14 NOTE — Progress Notes (Signed)
NST reviewed and reactive.  

## 2015-08-16 ENCOUNTER — Ambulatory Visit (INDEPENDENT_AMBULATORY_CARE_PROVIDER_SITE_OTHER): Payer: BLUE CROSS/BLUE SHIELD | Admitting: Obstetrics and Gynecology

## 2015-08-16 ENCOUNTER — Encounter: Payer: Self-pay | Admitting: Obstetrics and Gynecology

## 2015-08-16 ENCOUNTER — Ambulatory Visit (HOSPITAL_COMMUNITY)
Admission: RE | Admit: 2015-08-16 | Discharge: 2015-08-16 | Disposition: A | Discharge status: Home / Self Care | Payer: BLUE CROSS/BLUE SHIELD | Source: Ambulatory Visit | Attending: Obstetrics and Gynecology | Admitting: Obstetrics and Gynecology

## 2015-08-16 VITALS — BP 134/81 | HR 80 | Wt 196.0 lb

## 2015-08-16 DIAGNOSIS — O24419 Gestational diabetes mellitus in pregnancy, unspecified control: Secondary | ICD-10-CM | POA: Diagnosis not present

## 2015-08-16 DIAGNOSIS — O09523 Supervision of elderly multigravida, third trimester: Secondary | ICD-10-CM | POA: Insufficient documentation

## 2015-08-16 DIAGNOSIS — Z3A32 32 weeks gestation of pregnancy: Secondary | ICD-10-CM | POA: Diagnosis not present

## 2015-08-16 DIAGNOSIS — O10913 Unspecified pre-existing hypertension complicating pregnancy, third trimester: Secondary | ICD-10-CM | POA: Diagnosis not present

## 2015-08-16 DIAGNOSIS — O09213 Supervision of pregnancy with history of pre-term labor, third trimester: Secondary | ICD-10-CM | POA: Insufficient documentation

## 2015-08-16 DIAGNOSIS — O10919 Unspecified pre-existing hypertension complicating pregnancy, unspecified trimester: Secondary | ICD-10-CM

## 2015-08-16 DIAGNOSIS — O283 Abnormal ultrasonic finding on antenatal screening of mother: Secondary | ICD-10-CM | POA: Insufficient documentation

## 2015-08-16 DIAGNOSIS — O10013 Pre-existing essential hypertension complicating pregnancy, third trimester: Secondary | ICD-10-CM | POA: Insufficient documentation

## 2015-08-16 DIAGNOSIS — Z8759 Personal history of other complications of pregnancy, childbirth and the puerperium: Secondary | ICD-10-CM

## 2015-08-16 DIAGNOSIS — O285 Abnormal chromosomal and genetic finding on antenatal screening of mother: Secondary | ICD-10-CM

## 2015-08-16 DIAGNOSIS — O09893 Supervision of other high risk pregnancies, third trimester: Secondary | ICD-10-CM

## 2015-08-16 LAB — POCT URINALYSIS DIP (DEVICE)
BILIRUBIN URINE: NEGATIVE
Glucose, UA: NEGATIVE mg/dL
HGB URINE DIPSTICK: NEGATIVE
KETONES UR: NEGATIVE mg/dL
Leukocytes, UA: NEGATIVE
Nitrite: NEGATIVE
PH: 5.5 (ref 5.0–8.0)
PROTEIN: NEGATIVE mg/dL
Urobilinogen, UA: 0.2 mg/dL (ref 0.0–1.0)

## 2015-08-16 NOTE — Progress Notes (Signed)
For ob visit today with nst, will have BPP tomorrow in MFM.

## 2015-08-16 NOTE — Progress Notes (Signed)
Subjective:  Tammy Byrd is a 35 y.o. [id number] at 3634w5d being seen today for ongoing prenatal care.  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  Patient reports no complaints.  Contractions: Not present. Vag. Bleeding: None.  Movement: Present. Denies leaking of fluid.   The following portions of the patient's history were reviewed and updated as appropriate: allergies, current medications, past family history, past medical history, past social history, past surgical history and problem list. Problem list updated.  Objective:   Filed Vitals:   08/16/15 0907  BP: 134/81  Pulse: 80  Weight: 196 lb (88.905 kg)    Fetal Status: Fetal Heart Rate (bpm): NST   Movement: Present     General:  Alert, oriented and cooperative. Patient is in no acute distress.  Skin: Skin is warm and dry. No rash noted.   Cardiovascular: Normal heart rate noted  Respiratory: Normal respiratory effort, no problems with respiration noted  Abdomen: Soft, gravid, appropriate for gestational age. Pain/Pressure: Present     Pelvic:  Cervical exam deferred        Extremities: Normal range of motion.  Edema: Trace  Mental Status: Normal mood and affect. Normal behavior. Normal judgment and thought content.   Urinalysis: Urine Protein: Negative Urine Glucose: Negative  Assessment and Plan:  Pregnancy: G3P1102 at 6234w5d  1. Gestational diabetes mellitus (GDM) affecting pregnancy Patient only started taking her BS this AM and it was 101. Reviewed with patient AM fasting and 2hr PP parameters. RTC 1wk to review BS  log Patient getting 2x/week testing anyways due to cHTN Normal 6/21 EFW, AC, AFI  2. Chronic hypertension during pregnancy, antepartum Pt taking labetalol 300 tid with no issues NR NST with variable seen (normal baseline, mod variability)-->pt sent for BPP Continue with 2x/wk testing Normal 6/21 growth EFW, AC, AFI. Rpt growth in late July  3. Supervision of other high risk pregnancy, antepartum, third trimester Routine care D/w pt re: BC nv  4. 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 History of severe pre-eclampsia With 33wk iatrogenic PTB. Continue to follow  Preterm labor symptoms and general obstetric precautions including but not limited to vaginal bleeding, contractions, leaking of fluid and fetal movement were reviewed in detail with the patient. Please refer to After Visit Summary for other counseling recommendations.  Return in about 7 days (around 08/23/2015) for 0730 nst/afi/obfu, needs nst 08/20/15.   [place] Bingharlie Emogene Muratalla, MD

## 2015-08-17 ENCOUNTER — Ambulatory Visit (HOSPITAL_COMMUNITY): Payer: BLUE CROSS/BLUE SHIELD

## 2015-08-23 ENCOUNTER — Encounter: Payer: Self-pay | Admitting: Obstetrics and Gynecology

## 2015-08-23 ENCOUNTER — Ambulatory Visit (INDEPENDENT_AMBULATORY_CARE_PROVIDER_SITE_OTHER): Payer: BLUE CROSS/BLUE SHIELD | Admitting: Obstetrics and Gynecology

## 2015-08-23 VITALS — BP 145/85 | HR 79 | Wt 194.5 lb

## 2015-08-23 DIAGNOSIS — O162 Unspecified maternal hypertension, second trimester: Secondary | ICD-10-CM

## 2015-08-23 DIAGNOSIS — O24419 Gestational diabetes mellitus in pregnancy, unspecified control: Secondary | ICD-10-CM | POA: Diagnosis not present

## 2015-08-23 DIAGNOSIS — O10912 Unspecified pre-existing hypertension complicating pregnancy, second trimester: Secondary | ICD-10-CM | POA: Diagnosis not present

## 2015-08-23 DIAGNOSIS — O10919 Unspecified pre-existing hypertension complicating pregnancy, unspecified trimester: Secondary | ICD-10-CM

## 2015-08-23 DIAGNOSIS — O09522 Supervision of elderly multigravida, second trimester: Secondary | ICD-10-CM

## 2015-08-23 LAB — POCT URINALYSIS DIP (DEVICE)
Bilirubin Urine: NEGATIVE
GLUCOSE, UA: NEGATIVE mg/dL
Hgb urine dipstick: NEGATIVE
KETONES UR: NEGATIVE mg/dL
Leukocytes, UA: NEGATIVE
NITRITE: NEGATIVE
PH: 7 (ref 5.0–8.0)
PROTEIN: NEGATIVE mg/dL
Specific Gravity, Urine: 1.015 (ref 1.005–1.030)
UROBILINOGEN UA: 0.2 mg/dL (ref 0.0–1.0)

## 2015-08-23 MED ORDER — GLYBURIDE 2.5 MG PO TABS: 2.5000 mg | ORAL_TABLET | Freq: Every day | ORAL | Status: DC

## 2015-08-23 NOTE — Patient Instructions (Signed)

## 2015-08-23 NOTE — Progress Notes (Signed)
Pt states she has not been eating 3 snacks daily. She declines glyburide at this time and wants to try diet control for a few more days. She agrees to eat snacks and to bring her CBG log book to next appt. She understands that if blood glucose readings are not within range, she will need to begin po glyburide.

## 2015-08-23 NOTE — Progress Notes (Signed)
Subjective:  Tammy Byrd is a 35 y.o. [id number] at 3763w5d being seen today for ongoing prenatal care.  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  Patient reports no complaints.  Contractions: Not present. Vag. Bleeding: None.  Movement: Present. Denies leaking of fluid.  Taking labetalol 300mg  tid as directed and takes BASA most days. Working on diet, does not have HS snack though.  The following portions of the patient's history were reviewed and updated as appropriate: allergies, current medications, past family history, past medical history, past social history, past surgical history and problem list. Problem list updated.  Objective:   Filed Vitals:   08/23/15 1608  BP: 164/91  Pulse: 79  Weight: 194 lb 8 oz (88.225 kg)    Fetal Status: Fetal Heart Rate (bpm): 150   Movement: Present     General:  Alert, oriented and cooperative. Patient is in no acute distress.  Skin: Skin is warm and dry. No rash noted.   Cardiovascular: Normal heart rate noted  Respiratory: Normal respiratory effort, no problems with respiration noted  Abdomen: Soft, gravid, appropriate for gestational age. Pain/Pressure: Present     Pelvic:  Cervical exam deferred        Extremities: Normal range of motion.  Edema: Mild pitting, slight indentation  Mental Status: Normal mood and affect. Normal behavior. Normal judgment and thought content.   Urinalysis:     N/N CBGs (started CBGs  08/16/15): F 101-107-144-106-106-102-111-108, 2 hr pc b --103-110-139-124-107-114-127, 2hr pcl 88-107-119-102-120-113-113, 2 hr pcd  92-113 US 08/08/15: 67th %ile growth and AFI 17; 08/16/15: BPP 8/8, AFI 15 NST today reactive Assessment and Plan:  Pregnancy: G3P1102 at 3063w5d  1. Gestational diabetes mellitus (GDM) affecting pregnancy Fasting hyperglycemia> advised to start Glyburide hs but refuses at this time. Will send Rx to pharmacy and see if she will agree to start when she returns here in 4 days   2. Hypertension affecting pregnancy in second trimester, antepartum Serial US for growth and 2x/wk fetal testing  3. AMA (advanced maternal age) multigravida 35+, second trimester   Preterm labor symptoms and general obstetric precautions including but not limited to vaginal bleeding, contractions, leaking of fluid and fetal movement were reviewed in detail with the patient. Please refer to After Visit Summary for other counseling recommendations.  Return in about 1 week (around 08/30/2015).   Danae Orleanseirdre C Meliza Kage, CNM

## 2015-08-28 ENCOUNTER — Ambulatory Visit (INDEPENDENT_AMBULATORY_CARE_PROVIDER_SITE_OTHER): Payer: BLUE CROSS/BLUE SHIELD | Admitting: Advanced Practice Midwife

## 2015-08-28 VITALS — BP 149/85 | HR 80

## 2015-08-28 DIAGNOSIS — O24419 Gestational diabetes mellitus in pregnancy, unspecified control: Secondary | ICD-10-CM | POA: Diagnosis not present

## 2015-08-28 DIAGNOSIS — O10912 Unspecified pre-existing hypertension complicating pregnancy, second trimester: Secondary | ICD-10-CM | POA: Diagnosis not present

## 2015-08-28 DIAGNOSIS — O10919 Unspecified pre-existing hypertension complicating pregnancy, unspecified trimester: Secondary | ICD-10-CM

## 2015-08-28 NOTE — Progress Notes (Signed)
NST performed today was reviewed and was found to be reactive.  Continue recommended antenatal testing and prenatal care.  

## 2015-08-31 ENCOUNTER — Ambulatory Visit (INDEPENDENT_AMBULATORY_CARE_PROVIDER_SITE_OTHER): Payer: BLUE CROSS/BLUE SHIELD | Admitting: Family Medicine

## 2015-08-31 VITALS — BP 137/92 | HR 80 | Wt 196.5 lb

## 2015-08-31 DIAGNOSIS — O09523 Supervision of elderly multigravida, third trimester: Secondary | ICD-10-CM

## 2015-08-31 DIAGNOSIS — O10912 Unspecified pre-existing hypertension complicating pregnancy, second trimester: Secondary | ICD-10-CM | POA: Diagnosis not present

## 2015-08-31 DIAGNOSIS — O09893 Supervision of other high risk pregnancies, third trimester: Secondary | ICD-10-CM

## 2015-08-31 DIAGNOSIS — Z36 Encounter for antenatal screening of mother: Secondary | ICD-10-CM | POA: Diagnosis not present

## 2015-08-31 DIAGNOSIS — O24419 Gestational diabetes mellitus in pregnancy, unspecified control: Secondary | ICD-10-CM | POA: Diagnosis not present

## 2015-08-31 DIAGNOSIS — O10919 Unspecified pre-existing hypertension complicating pregnancy, unspecified trimester: Secondary | ICD-10-CM

## 2015-08-31 LAB — POCT URINALYSIS DIP (DEVICE)
BILIRUBIN URINE: NEGATIVE
GLUCOSE, UA: NEGATIVE mg/dL
Hgb urine dipstick: NEGATIVE
KETONES UR: NEGATIVE mg/dL
LEUKOCYTES UA: NEGATIVE
Nitrite: NEGATIVE
PROTEIN: NEGATIVE mg/dL
Specific Gravity, Urine: >=1.03 (ref 1.005–1.030)
Urobilinogen, UA: 0.2 mg/dL (ref 0.0–1.0)
pH: 6.5 (ref 5.0–8.0)

## 2015-08-31 NOTE — Progress Notes (Signed)
US for growth scheduled 7/20

## 2015-08-31 NOTE — Progress Notes (Signed)
Subjective:  Tammy Byrd is a 35 y.o. [id number] at 2862w6d being seen today for ongoing prenatal care.  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  GDM: Patient taking glyburide 2.5mg  QHS.  Has been taking this for a couple of days.  Reports no hypoglycemic episodes.  Tolerating medication well Fasting: 60-80 on medications, 90s-100s off. 2hr PP: controlled  Patient reports no complaints.  Contractions: Not present. Vag. Bleeding: None.  Movement: Present. Denies leaking of fluid.   The following portions of the patient's history were reviewed and updated as appropriate: allergies, current medications, past family history, past medical history, past social history, past surgical history and problem list. Problem list updated.  Objective:   Filed Vitals:   08/31/15 0809  BP: 137/92  Pulse: 80  Weight: 196 lb 8 oz (89.132 kg)    Fetal Status: Fetal Heart Rate (bpm): NST   Movement: Present     General:  Alert, oriented and cooperative. Patient is in no acute distress.  Skin: Skin is warm and dry. No rash noted.   Cardiovascular: Normal heart rate noted  Respiratory: Normal respiratory effort, no problems with respiration noted  Abdomen: Soft, gravid, appropriate for gestational age. Pain/Pressure: Present     Pelvic: Vag. Bleeding: None Vag D/C Character: Mucous   Cervical exam deferred        Extremities: Normal range of motion.  Edema: Mild pitting, slight indentation  Mental Status: Normal mood and affect. Normal behavior. Normal judgment and thought content.   Urinalysis:       Assessment and Plan:  Pregnancy: G3P1102 at 3662w6d  1. Supervision of other high risk pregnancy, antepartum, third trimester FHT normal  2. Chronic hypertension during pregnancy, antepartum BP controlled.  NST reactive. Continue testing. - Amniotic fluid index with NST  3. Gestational diabetes mellitus (GDM) affecting pregnancy NST reactive.   Discussed hypoglycemic symptoms.    - Amniotic fluid index with NST  4. AMA (advanced maternal age) multigravida 35+, third trimester   Preterm labor symptoms and general obstetric precautions including but not limited to vaginal bleeding, contractions, leaking of fluid and fetal movement were reviewed in detail with the patient. Please refer to After Visit Summary for other counseling recommendations.  Return in about 4 days (around 09/04/2015) for 7/18 & 7/20 as scheduled.   Levie HeritageJacob J Stinson, DO

## 2015-09-04 ENCOUNTER — Encounter: Payer: Self-pay | Admitting: Obstetrics and Gynecology

## 2015-09-04 ENCOUNTER — Other Ambulatory Visit: Payer: BLUE CROSS/BLUE SHIELD | Admitting: Family

## 2015-09-04 ENCOUNTER — Encounter (HOSPITAL_COMMUNITY): Payer: Self-pay | Admitting: *Deleted

## 2015-09-04 ENCOUNTER — Inpatient Hospital Stay (HOSPITAL_COMMUNITY): Payer: BLUE CROSS/BLUE SHIELD | Admitting: Anesthesiology

## 2015-09-04 ENCOUNTER — Inpatient Hospital Stay (HOSPITAL_COMMUNITY)
Admission: AD | Admit: 2015-09-04 | Discharge: 2015-09-06 | DRG: 765 | Disposition: A | Discharge status: Home / Self Care | Payer: BLUE CROSS/BLUE SHIELD | Source: Ambulatory Visit | Attending: Obstetrics & Gynecology | Admitting: Obstetrics & Gynecology

## 2015-09-04 ENCOUNTER — Encounter (HOSPITAL_COMMUNITY)
Admission: AD | Disposition: A | Discharge status: Home / Self Care | Payer: Self-pay | Source: Ambulatory Visit | Attending: Obstetrics and Gynecology

## 2015-09-04 ENCOUNTER — Inpatient Hospital Stay (HOSPITAL_COMMUNITY): Payer: BLUE CROSS/BLUE SHIELD

## 2015-09-04 DIAGNOSIS — O24425 Gestational diabetes mellitus in childbirth, controlled by oral hypoglycemic drugs: Secondary | ICD-10-CM | POA: Diagnosis present

## 2015-09-04 DIAGNOSIS — Z3A35 35 weeks gestation of pregnancy: Secondary | ICD-10-CM | POA: Diagnosis not present

## 2015-09-04 DIAGNOSIS — O24419 Gestational diabetes mellitus in pregnancy, unspecified control: Secondary | ICD-10-CM

## 2015-09-04 DIAGNOSIS — O1092 Unspecified pre-existing hypertension complicating childbirth: Secondary | ICD-10-CM

## 2015-09-04 DIAGNOSIS — O285 Abnormal chromosomal and genetic finding on antenatal screening of mother: Secondary | ICD-10-CM

## 2015-09-04 DIAGNOSIS — O24429 Gestational diabetes mellitus in childbirth, unspecified control: Secondary | ICD-10-CM

## 2015-09-04 DIAGNOSIS — Z98891 History of uterine scar from previous surgery: Secondary | ICD-10-CM

## 2015-09-04 DIAGNOSIS — O10919 Unspecified pre-existing hypertension complicating pregnancy, unspecified trimester: Secondary | ICD-10-CM

## 2015-09-04 DIAGNOSIS — O114 Pre-existing hypertension with pre-eclampsia, complicating childbirth: Secondary | ICD-10-CM | POA: Diagnosis present

## 2015-09-04 DIAGNOSIS — O321XX0 Maternal care for breech presentation, not applicable or unspecified: Principal | ICD-10-CM | POA: Diagnosis present

## 2015-09-04 DIAGNOSIS — O4693 Antepartum hemorrhage, unspecified, third trimester: Secondary | ICD-10-CM | POA: Diagnosis present

## 2015-09-04 DIAGNOSIS — O4690 Antepartum hemorrhage, unspecified, unspecified trimester: Secondary | ICD-10-CM | POA: Diagnosis present

## 2015-09-04 DIAGNOSIS — Z8751 Personal history of pre-term labor: Secondary | ICD-10-CM

## 2015-09-04 DIAGNOSIS — O1002 Pre-existing essential hypertension complicating childbirth: Secondary | ICD-10-CM | POA: Diagnosis present

## 2015-09-04 DIAGNOSIS — O320XX0 Maternal care for unstable lie, not applicable or unspecified: Secondary | ICD-10-CM | POA: Insufficient documentation

## 2015-09-04 LAB — URINALYSIS, ROUTINE W REFLEX MICROSCOPIC
BILIRUBIN URINE: NEGATIVE
Glucose, UA: NEGATIVE mg/dL
Ketones, ur: NEGATIVE mg/dL
Leukocytes, UA: NEGATIVE
NITRITE: NEGATIVE
PROTEIN: NEGATIVE mg/dL
Specific Gravity, Urine: 1.01 (ref 1.005–1.030)
pH: 6.5 (ref 5.0–8.0)

## 2015-09-04 LAB — COMPREHENSIVE METABOLIC PANEL
ALBUMIN: 2.8 g/dL — AB (ref 3.5–5.0)
ALT: 12 U/L — ABNORMAL LOW (ref 14–54)
AST: 28 U/L (ref 15–41)
Alkaline Phosphatase: 84 U/L (ref 38–126)
Anion gap: 6 (ref 5–15)
BUN: 7 mg/dL (ref 6–20)
CHLORIDE: 109 mmol/L (ref 101–111)
CO2: 21 mmol/L — ABNORMAL LOW (ref 22–32)
Calcium: 9.2 mg/dL (ref 8.9–10.3)
Creatinine, Ser: 0.35 mg/dL — ABNORMAL LOW (ref 0.44–1.00)
GFR calc Af Amer: >60 mL/min (ref >60)
GFR calc non Af Amer: >60 mL/min (ref >60)
GLUCOSE: 76 mg/dL (ref 65–99)
POTASSIUM: 4.3 mmol/L (ref 3.5–5.1)
Sodium: 136 mmol/L (ref 135–145)
Total Bilirubin: 0.4 mg/dL (ref 0.3–1.2)
Total Protein: 5.6 g/dL — ABNORMAL LOW (ref 6.5–8.1)

## 2015-09-04 LAB — CBC
HCT: 31.8 % — ABNORMAL LOW (ref 36.0–46.0)
HEMATOCRIT: 30.3 % — AB (ref 36.0–46.0)
Hemoglobin: 10.1 g/dL — ABNORMAL LOW (ref 12.0–15.0)
Hemoglobin: 10.7 g/dL — ABNORMAL LOW (ref 12.0–15.0)
MCH: 21.8 pg — ABNORMAL LOW (ref 26.0–34.0)
MCH: 22.1 pg — AB (ref 26.0–34.0)
MCHC: 33.3 g/dL (ref 30.0–36.0)
MCHC: 33.6 g/dL (ref 30.0–36.0)
MCV: 65.3 fL — ABNORMAL LOW (ref 78.0–100.0)
MCV: 65.6 fL — ABNORMAL LOW (ref 78.0–100.0)
PLATELETS: 182 10*3/uL (ref 150–400)
Platelets: 185 10*3/uL (ref 150–400)
RBC: 4.64 MIL/uL (ref 3.87–5.11)
RBC: 4.85 MIL/uL (ref 3.87–5.11)
RDW: 17.5 % — AB (ref 11.5–15.5)
RDW: 17.5 % — AB (ref 11.5–15.5)
WBC: 10.3 10*3/uL (ref 4.0–10.5)
WBC: 18.2 10*3/uL — AB (ref 4.0–10.5)

## 2015-09-04 LAB — TYPE AND SCREEN
ABO/RH(D): A POS
Antibody Screen: NEGATIVE

## 2015-09-04 LAB — PROTEIN / CREATININE RATIO, URINE
Creatinine, Urine: 62 mg/dL
Protein Creatinine Ratio: 0.42 mg/mg{Cre} — ABNORMAL HIGH (ref 0.00–0.15)
Total Protein, Urine: 26 mg/dL

## 2015-09-04 LAB — WET PREP, GENITAL
CLUE CELLS WET PREP: NONE SEEN
SPERM: NONE SEEN
TRICH WET PREP: NONE SEEN
YEAST WET PREP: NONE SEEN

## 2015-09-04 LAB — ABO/RH: ABO/RH(D): A POS

## 2015-09-04 LAB — GLUCOSE, CAPILLARY
GLUCOSE-CAPILLARY: 123 mg/dL — AB (ref 65–99)
Glucose-Capillary: 80 mg/dL (ref 65–99)
Glucose-Capillary: 84 mg/dL (ref 65–99)
Glucose-Capillary: 89 mg/dL (ref 65–99)
Glucose-Capillary: 113 mg/dL — ABNORMAL HIGH (ref 65–99)

## 2015-09-04 LAB — URINE MICROSCOPIC-ADD ON

## 2015-09-04 SURGERY — Surgical Case
Anesthesia: Epidural | Site: Abdomen | Wound class: Clean Contaminated

## 2015-09-04 MED ORDER — ACETAMINOPHEN 500 MG PO TABS
1000.0000 mg | ORAL_TABLET | Freq: Four times a day (QID) | ORAL | Status: AC
  Administered 2015-09-04 – 2015-09-05 (×3): 1000 mg via ORAL
  Filled 2015-09-04 (×4): qty 2

## 2015-09-04 MED ORDER — MORPHINE SULFATE (PF) 0.5 MG/ML IJ SOLN
INTRAMUSCULAR | Status: AC
  Filled 2015-09-04: qty 10

## 2015-09-04 MED ORDER — LACTATED RINGERS IV SOLN
INTRAVENOUS | Status: DC
  Administered 2015-09-05: 03:00:00 via INTRAVENOUS

## 2015-09-04 MED ORDER — SENNOSIDES-DOCUSATE SODIUM 8.6-50 MG PO TABS
2.0000 | ORAL_TABLET | ORAL | Status: DC
  Administered 2015-09-04 – 2015-09-06 (×2): 2 via ORAL
  Filled 2015-09-04 (×2): qty 2

## 2015-09-04 MED ORDER — NALOXONE HCL 0.4 MG/ML IJ SOLN: 0.4000 mg | INTRAMUSCULAR | Status: DC | PRN

## 2015-09-04 MED ORDER — OXYTOCIN 10 UNIT/ML IJ SOLN
INTRAMUSCULAR | Status: AC
  Filled 2015-09-04: qty 8

## 2015-09-04 MED ORDER — IBUPROFEN 600 MG PO TABS
600.0000 mg | ORAL_TABLET | Freq: Four times a day (QID) | ORAL | Status: DC
  Administered 2015-09-05 – 2015-09-06 (×7): 600 mg via ORAL
  Filled 2015-09-04 (×7): qty 1

## 2015-09-04 MED ORDER — WITCH HAZEL-GLYCERIN EX PADS: 1.0000 "application " | MEDICATED_PAD | CUTANEOUS | Status: DC | PRN

## 2015-09-04 MED ORDER — HYDROMORPHONE HCL 1 MG/ML IJ SOLN: 0.2500 mg | INTRAMUSCULAR | Status: DC | PRN

## 2015-09-04 MED ORDER — NALBUPHINE HCL 10 MG/ML IJ SOLN: 5.0000 mg | INTRAMUSCULAR | Status: DC | PRN

## 2015-09-04 MED ORDER — SCOPOLAMINE 1 MG/3DAYS TD PT72: 1.0000 | MEDICATED_PATCH | Freq: Once | TRANSDERMAL | Status: DC

## 2015-09-04 MED ORDER — SIMETHICONE 80 MG PO CHEW
80.0000 mg | CHEWABLE_TABLET | ORAL | Status: DC
  Administered 2015-09-04 – 2015-09-06 (×2): 80 mg via ORAL
  Filled 2015-09-04 (×2): qty 1

## 2015-09-04 MED ORDER — SIMETHICONE 80 MG PO CHEW
80.0000 mg | CHEWABLE_TABLET | Freq: Three times a day (TID) | ORAL | Status: DC
  Administered 2015-09-05 – 2015-09-06 (×5): 80 mg via ORAL
  Filled 2015-09-04 (×5): qty 1

## 2015-09-04 MED ORDER — CEFAZOLIN SODIUM-DEXTROSE 2-4 GM/100ML-% IV SOLN
2.0000 g | INTRAVENOUS | Status: AC
  Administered 2015-09-04: 2 g via INTRAVENOUS
  Filled 2015-09-04: qty 100

## 2015-09-04 MED ORDER — PRENATAL MULTIVITAMIN CH
1.0000 | ORAL_TABLET | Freq: Every day | ORAL | Status: DC
  Administered 2015-09-04: 1 via ORAL
  Filled 2015-09-04: qty 1

## 2015-09-04 MED ORDER — DIBUCAINE 1 % RE OINT: 1.0000 "application " | TOPICAL_OINTMENT | RECTAL | Status: DC | PRN

## 2015-09-04 MED ORDER — COCONUT OIL OIL: 1.0000 "application " | TOPICAL_OIL | Status: DC | PRN

## 2015-09-04 MED ORDER — BUPIVACAINE IN DEXTROSE 0.75-8.25 % IT SOLN
INTRATHECAL | Status: AC
  Filled 2015-09-04: qty 4

## 2015-09-04 MED ORDER — LACTATED RINGERS IV SOLN
125.0000 mL/h | INTRAVENOUS | Status: DC
  Administered 2015-09-04 (×2): via INTRAVENOUS

## 2015-09-04 MED ORDER — PHENYLEPHRINE 8 MG IN D5W 100 ML (0.08MG/ML) PREMIX OPTIME
INJECTION | INTRAVENOUS | Status: DC | PRN
  Administered 2015-09-04: 40 ug/min via INTRAVENOUS

## 2015-09-04 MED ORDER — NALBUPHINE HCL 10 MG/ML IJ SOLN: 5.0000 mg | Freq: Once | INTRAMUSCULAR | Status: DC | PRN

## 2015-09-04 MED ORDER — DIPHENHYDRAMINE HCL 25 MG PO CAPS: 25.0000 mg | ORAL_CAPSULE | Freq: Four times a day (QID) | ORAL | Status: DC | PRN

## 2015-09-04 MED ORDER — GLYBURIDE 2.5 MG PO TABS
2.5000 mg | ORAL_TABLET | Freq: Every day | ORAL | Status: DC
  Administered 2015-09-04 – 2015-09-05 (×2): 2.5 mg via ORAL
  Filled 2015-09-04 (×4): qty 1

## 2015-09-04 MED ORDER — ASPIRIN EC 81 MG PO TBEC
81.0000 mg | DELAYED_RELEASE_TABLET | Freq: Every day | ORAL | Status: DC
  Administered 2015-09-04: 81 mg via ORAL
  Filled 2015-09-04 (×3): qty 1

## 2015-09-04 MED ORDER — PRENATAL MULTIVITAMIN CH
1.0000 | ORAL_TABLET | Freq: Every day | ORAL | Status: DC
  Administered 2015-09-05 – 2015-09-06 (×2): 1 via ORAL
  Filled 2015-09-04 (×2): qty 1

## 2015-09-04 MED ORDER — ONDANSETRON HCL 4 MG/2ML IJ SOLN
INTRAMUSCULAR | Status: AC
  Filled 2015-09-04: qty 2

## 2015-09-04 MED ORDER — OXYCODONE HCL 5 MG PO TABS: 5.0000 mg | ORAL_TABLET | ORAL | Status: DC | PRN

## 2015-09-04 MED ORDER — ONDANSETRON HCL 4 MG/2ML IJ SOLN
INTRAMUSCULAR | Status: DC | PRN
  Administered 2015-09-04: 4 mg via INTRAVENOUS

## 2015-09-04 MED ORDER — DIPHENHYDRAMINE HCL 50 MG/ML IJ SOLN: 12.5000 mg | INTRAMUSCULAR | Status: DC | PRN

## 2015-09-04 MED ORDER — ONDANSETRON HCL 4 MG/2ML IJ SOLN: 4.0000 mg | Freq: Three times a day (TID) | INTRAMUSCULAR | Status: DC | PRN

## 2015-09-04 MED ORDER — OXYCODONE HCL 5 MG PO TABS: 10.0000 mg | ORAL_TABLET | ORAL | Status: DC | PRN

## 2015-09-04 MED ORDER — TETANUS-DIPHTH-ACELL PERTUSSIS 5-2.5-18.5 LF-MCG/0.5 IM SUSP
0.5000 mL | Freq: Once | INTRAMUSCULAR | Status: DC
Start: 2015-09-05 — End: 2015-09-05

## 2015-09-04 MED ORDER — OXYTOCIN 40 UNITS IN LACTATED RINGERS INFUSION - SIMPLE MED: 2.5000 [IU]/h | INTRAVENOUS | Status: AC

## 2015-09-04 MED ORDER — MENTHOL 3 MG MT LOZG: 1.0000 | LOZENGE | OROMUCOSAL | Status: DC | PRN

## 2015-09-04 MED ORDER — ZOLPIDEM TARTRATE 5 MG PO TABS: 5.0000 mg | ORAL_TABLET | Freq: Every evening | ORAL | Status: DC | PRN

## 2015-09-04 MED ORDER — SIMETHICONE 80 MG PO CHEW: 80.0000 mg | CHEWABLE_TABLET | ORAL | Status: DC | PRN

## 2015-09-04 MED ORDER — NALOXONE HCL 2 MG/2ML IJ SOSY
1.0000 ug/kg/h | PREFILLED_SYRINGE | INTRAMUSCULAR | Status: DC | PRN
  Filled 2015-09-04: qty 2

## 2015-09-04 MED ORDER — HYDROCHLOROTHIAZIDE 25 MG PO TABS
25.0000 mg | ORAL_TABLET | Freq: Every day | ORAL | Status: DC
  Administered 2015-09-04 – 2015-09-05 (×2): 25 mg via ORAL
  Filled 2015-09-04 (×2): qty 1

## 2015-09-04 MED ORDER — SOD CITRATE-CITRIC ACID 500-334 MG/5ML PO SOLN
ORAL | Status: AC
  Administered 2015-09-04: 30 mL
  Filled 2015-09-04: qty 15

## 2015-09-04 MED ORDER — DIPHENHYDRAMINE HCL 25 MG PO CAPS: 25.0000 mg | ORAL_CAPSULE | ORAL | Status: DC | PRN

## 2015-09-04 MED ORDER — FENTANYL CITRATE (PF) 100 MCG/2ML IJ SOLN: 100.0000 ug | INTRAMUSCULAR | Status: DC | PRN

## 2015-09-04 MED ORDER — FENTANYL CITRATE (PF) 100 MCG/2ML IJ SOLN
INTRAMUSCULAR | Status: DC | PRN
  Administered 2015-09-04: 75 ug via INTRAVENOUS

## 2015-09-04 MED ORDER — CALCIUM CARBONATE ANTACID 500 MG PO CHEW: 2.0000 | CHEWABLE_TABLET | ORAL | Status: DC | PRN

## 2015-09-04 MED ORDER — HYDROCHLOROTHIAZIDE 25 MG PO TABS: 25.0000 mg | ORAL_TABLET | Freq: Every day | ORAL | Status: DC

## 2015-09-04 MED ORDER — LABETALOL HCL 300 MG PO TABS
300.0000 mg | ORAL_TABLET | Freq: Three times a day (TID) | ORAL | Status: DC
  Administered 2015-09-04: 300 mg via ORAL
  Filled 2015-09-04: qty 1

## 2015-09-04 MED ORDER — ACETAMINOPHEN 325 MG PO TABS: 650.0000 mg | ORAL_TABLET | ORAL | Status: DC | PRN

## 2015-09-04 MED ORDER — BETAMETHASONE SOD PHOS & ACET 6 (3-3) MG/ML IJ SUSP
12.0000 mg | INTRAMUSCULAR | Status: DC
  Administered 2015-09-04: 12 mg via INTRAMUSCULAR
  Filled 2015-09-04 (×2): qty 2

## 2015-09-04 MED ORDER — PHENYLEPHRINE 8 MG IN D5W 100 ML (0.08MG/ML) PREMIX OPTIME
INJECTION | INTRAVENOUS | Status: AC
  Filled 2015-09-04: qty 100

## 2015-09-04 MED ORDER — OXYTOCIN 10 UNIT/ML IJ SOLN
40.0000 [IU] | INTRAVENOUS | Status: DC | PRN
  Administered 2015-09-04: 40 [IU] via INTRAVENOUS

## 2015-09-04 MED ORDER — DOCUSATE SODIUM 100 MG PO CAPS
100.0000 mg | ORAL_CAPSULE | Freq: Every day | ORAL | Status: DC
  Administered 2015-09-04 – 2015-09-06 (×3): 100 mg via ORAL
  Filled 2015-09-04 (×3): qty 1

## 2015-09-04 MED ORDER — SCOPOLAMINE 1 MG/3DAYS TD PT72
MEDICATED_PATCH | TRANSDERMAL | Status: AC
  Filled 2015-09-04: qty 3

## 2015-09-04 MED ORDER — LACTATED RINGERS IV SOLN
INTRAVENOUS | Status: DC | PRN
  Administered 2015-09-04: 15:00:00 via INTRAVENOUS

## 2015-09-04 MED ORDER — SCOPOLAMINE 1 MG/3DAYS TD PT72
MEDICATED_PATCH | TRANSDERMAL | Status: DC | PRN
  Administered 2015-09-04: 1 via TRANSDERMAL

## 2015-09-04 MED ORDER — SCOPOLAMINE 1 MG/3DAYS TD PT72
MEDICATED_PATCH | TRANSDERMAL | Status: AC
  Filled 2015-09-04: qty 1

## 2015-09-04 MED ORDER — FENTANYL CITRATE (PF) 100 MCG/2ML IJ SOLN
INTRAMUSCULAR | Status: AC
  Filled 2015-09-04: qty 2

## 2015-09-04 MED ORDER — ACETAMINOPHEN 325 MG PO TABS
650.0000 mg | ORAL_TABLET | ORAL | Status: DC | PRN
Start: 2015-09-04 — End: 2015-09-06
  Administered 2015-09-04 – 2015-09-05 (×2): 650 mg via ORAL
  Filled 2015-09-04 (×2): qty 2

## 2015-09-04 MED ORDER — SODIUM CHLORIDE 0.9% FLUSH: 3.0000 mL | INTRAVENOUS | Status: DC | PRN

## 2015-09-04 MED ORDER — MEPERIDINE HCL 25 MG/ML IJ SOLN: 6.2500 mg | INTRAMUSCULAR | Status: DC | PRN

## 2015-09-04 SURGICAL SUPPLY — 41 items
APL SKNCLS STERI-STRIP NONHPOA (GAUZE/BANDAGES/DRESSINGS) ×1
BENZOIN TINCTURE PRP APPL 2/3 (GAUZE/BANDAGES/DRESSINGS) ×3 IMPLANT
CATH ROBINSON RED A/P 16FR (CATHETERS) IMPLANT
CHLORAPREP W/TINT 26ML (MISCELLANEOUS) ×3 IMPLANT
CLAMP CORD UMBIL (MISCELLANEOUS) IMPLANT
CLOSURE WOUND 1/2 X4 (GAUZE/BANDAGES/DRESSINGS) ×1
CLOTH BEACON ORANGE TIMEOUT ST (SAFETY) ×3 IMPLANT
DRSG OPSITE POSTOP 4X10 (GAUZE/BANDAGES/DRESSINGS) ×3 IMPLANT
ELECT REM PT RETURN 9FT ADLT (ELECTROSURGICAL) ×3
ELECTRODE REM PT RTRN 9FT ADLT (ELECTROSURGICAL) ×1 IMPLANT
EXTRACTOR VACUUM M CUP 4 TUBE (SUCTIONS) IMPLANT
EXTRACTOR VACUUM M CUP 4' TUBE (SUCTIONS)
GLOVE BIOGEL PI IND STRL 7.0 (GLOVE) ×1 IMPLANT
GLOVE BIOGEL PI IND STRL 7.5 (GLOVE) ×2 IMPLANT
GLOVE BIOGEL PI INDICATOR 7.0 (GLOVE) ×2
GLOVE BIOGEL PI INDICATOR 7.5 (GLOVE) ×4
GLOVE ECLIPSE 7.5 STRL STRAW (GLOVE) ×3 IMPLANT
GOWN STRL REUS W/TWL LRG LVL3 (GOWN DISPOSABLE) ×9 IMPLANT
KIT ABG SYR 3ML LUER SLIP (SYRINGE) IMPLANT
NDL HYPO 25X5/8 SAFETYGLIDE (NEEDLE) IMPLANT
NEEDLE HYPO 25X5/8 SAFETYGLIDE (NEEDLE) IMPLANT
NS IRRIG 1000ML POUR BTL (IV SOLUTION) ×3 IMPLANT
PACK C SECTION WH (CUSTOM PROCEDURE TRAY) ×3 IMPLANT
PAD ABD 8X10 STRL (GAUZE/BANDAGES/DRESSINGS) ×2 IMPLANT
PAD OB MATERNITY 4.3X12.25 (PERSONAL CARE ITEMS) ×3 IMPLANT
PENCIL SMOKE EVAC W/HOLSTER (ELECTROSURGICAL) ×3 IMPLANT
RETAINER VISCERAL (MISCELLANEOUS) ×2 IMPLANT
RTRCTR C-SECT PINK 25CM LRG (MISCELLANEOUS) ×3 IMPLANT
SPONGE GAUZE 4X4 12PLY (GAUZE/BANDAGES/DRESSINGS) ×2 IMPLANT
STRIP CLOSURE SKIN 1/2X4 (GAUZE/BANDAGES/DRESSINGS) ×2 IMPLANT
SUT MNCRL 0 VIOLET CTX 36 (SUTURE) IMPLANT
SUT MONOCRYL 0 CTX 36 (SUTURE)
SUT VIC AB 0 CTX 36 (SUTURE) ×9
SUT VIC AB 0 CTX36XBRD ANBCTRL (SUTURE) ×3 IMPLANT
SUT VIC AB 2-0 CT1 27 (SUTURE) ×3
SUT VIC AB 2-0 CT1 TAPERPNT 27 (SUTURE) ×1 IMPLANT
SUT VIC AB 4-0 KS 27 (SUTURE) ×3 IMPLANT
TAPE HYPAFIX 6 X30' (GAUZE/BANDAGES/DRESSINGS) ×1
TAPE HYPAFIX 6X30 (GAUZE/BANDAGES/DRESSINGS) ×1 IMPLANT
TOWEL OR 17X24 6PK STRL BLUE (TOWEL DISPOSABLE) ×3 IMPLANT
TRAY FOLEY CATH SILVER 14FR (SET/KITS/TRAYS/PACK) ×3 IMPLANT

## 2015-09-04 NOTE — Progress Notes (Signed)
Patient ID: Tammy Byrd, female   DOB: 06/24/1980, 35 y.o.   MRN: [id number]  Patient seen - contractions q2-3, cervix advancing.  Bedside US = breech.  Pt consented for cesarean section.  NPO since 9:30a.  The risks of cesarean section discussed with the patient included but were not limited to: bleeding which may require transfusion or reoperation; infection which may require antibiotics; injury to bowel, bladder, ureters or other surrounding organs; injury to the fetus; need for additional procedures including hysterectomy in the event of a life-threatening hemorrhage; placental abnormalities wth subsequent pregnancies, incisional problems, thromboembolic phenomenon and other postoperative/anesthesia complications. The patient concurred with the proposed plan, giving informed written consent for the procedure.  She will remain NPO for procedure. Anesthesia and OR aware.  Preoperative prophylactic Ancef ordered on call to the OR.  To OR when ready.  Levie HeritageJacob J Landy Dunnavant, DO 09/04/2015 1:44 PM

## 2015-09-04 NOTE — Anesthesia Postprocedure Evaluation (Signed)
Anesthesia Post Note  Patient: Tammy Byrd  Procedure(s) Performed: Procedure(s) (LRB): CESAREAN SECTION (N/A)  Patient location during evaluation: Mother Baby Anesthesia Type: Spinal Level of consciousness: awake and alert and oriented Pain management: satisfactory to patient Vital Signs Assessment: post-procedure vital signs reviewed and stable Respiratory status: spontaneous breathing and nonlabored ventilation Cardiovascular status: stable Postop Assessment: no headache, no backache, patient able to bend at knees, no signs of nausea or vomiting and adequate PO intake Anesthetic complications: no     Last Vitals:  Filed Vitals:   09/04/15 1708 09/04/15 1800  BP: 126/83 148/94  Pulse: 72 72  Temp: 36.6 C 36.7 C  Resp: 18 16    Last Pain:  Filed Vitals:   09/04/15 1835  PainSc: 0-No pain   Pain Goal:                 Madison HickmanGREGORY,Antonio Woodhams

## 2015-09-04 NOTE — H&P (Signed)
Tammy Byrd is a 35 y.o. female [phone] at 4783w3d who presents to maternity admissions reporting "lots of mucousy discharge with little red specks and streaks in it."  Over the past week, reports multiple episodes of brown and pink spotting. She did call the office and was reassured over the phone with precautions given/reasons to come to MAU.  Feels the bleeding is increasing so came to MAU.  Denies cramping or pain.  Does endorse BH ctxs frequently this past week.  She has not tried any treatments.  She reports good fetal movement, denies LOF, vaginal itching/burning, urinary symptoms, h/a, dizziness, n/v, or fever/chills.     [facility]  Dating LMP Blood type: A/POS/-- (02/22 1425)   Genetic Screen 1 Screen:Too late AFP/Quad: wnl NIPS: Declined Antibody:NEG (02/22 1425)  Anatomic US Normal female Rubella: 25.10 (02/22 1425)  GTT Early:               Third trimester: 194 RPR: NON REAC (02/22 1425)   Flu vaccine 04/11/15 HBsAg: NEGATIVE (02/22 1425)   TDaP vaccine                                               Rhogam:NA HIV: NONREACTIVE (02/22 1425)   Baby Food                                               GBS: (For PCN allergy, check sensitivities)  Contraception  Pap: Normal  Circumcision    Pediatrician    Support Person       Maternal Medical History:  Reason for admission: Vaginal bleeding.  Nausea. HTN   Contractions: Onset was yesterday.   Frequency: irregular.   Perceived severity is mild.    Fetal activity: Perceived fetal activity is normal.   Last perceived fetal movement was within the past hour.    Prenatal complications: Bleeding and PIH.   Prenatal Complications - Diabetes: gestational. Diabetes is managed by oral agent (monotherapy).      OB History    Gravida Para Term Preterm AB TAB SAB Ectopic Multiple Living   3 2 1 1      0 2     Past Medical History  Diagnosis Date  . 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 GDM (gestational diabetes  mellitus)   . History of severe pre-eclampsia    Past Surgical History  Procedure Laterality Date  . No past surgeries     Family History: family history is not on file. Social History:  reports that she has never smoked. She does not have any smokeless tobacco history on file. She reports that she does not drink alcohol or use illicit drugs.   Prenatal Transfer Tool  Maternal Diabetes: Yes:  Diabetes Type:  Insulin/Medication controlled Genetic Screening: Normal Maternal Ultrasounds/Referrals: Normal Fetal Ultrasounds or other Referrals:  None Maternal Substance Abuse:  No Significant Maternal Medications:  Meds include: Other:  Significant Maternal Lab Results:  Lab values include: Other:  Other Comments:  On glyburide and labetalol.  GBS unknown  Review of Systems  Constitutional: Negative for fever, chills and malaise/fatigue.  Eyes: Negative for blurred vision.  Respiratory: Negative for cough and shortness of breath.   Cardiovascular: Negative for chest  pain.  Gastrointestinal: Negative for heartburn, nausea, vomiting and abdominal pain.  Genitourinary: Negative for dysuria, urgency and frequency.  Musculoskeletal: Negative.   Neurological: Negative for dizziness and headaches.  Psychiatric/Behavioral: Negative for depression.    Dilation: 2 Effacement (%): 70 Exam by:: Misty Stanley Leftwich-Kirby CNM Blood pressure 140/78, pulse 77, temperature 97.7 F (36.5 C), temperature source Oral, resp. rate 19, height  (1.575 m), weight 200 lb (90.719 kg), last menstrual period 12/30/2014, SpO2 100 %, unknown if currently breastfeeding. Maternal Exam:  Uterine Assessment: Contraction strength is mild.  Contraction frequency is rare.   Abdomen: Patient reports no abdominal tenderness. Fetal presentation: breech  Cervix: Cervix evaluated by digital exam.     Fetal Exam Fetal Monitor Review: Mode: ultrasound.   Baseline rate: 135.  Variability: moderate (6-25 bpm).   Pattern:  accelerations present and no decelerations.    Fetal State Assessment: Category I - tracings are normal.     Physical Exam  Nursing note and vitals reviewed. Constitutional: She is oriented to person, place, and time. She appears well-developed and well-nourished.  Neck: Normal range of motion.  Cardiovascular: Normal rate, regular rhythm and normal heart sounds.   Respiratory: Effort normal and breath sounds normal.  GI: Soft.  Musculoskeletal: Normal range of motion.  Neurological: She is alert and oriented to person, place, and time. She has normal reflexes.  Skin: Skin is warm and dry.  Psychiatric: She has a normal mood and affect. Her behavior is normal. Judgment and thought content normal.    Imaging:  Limited OB with preliminary report of normal AFI, placenta, FHR, breech position noted  Prenatal labs: ABO, Rh: A/POS/-- (02/22 1425) Antibody: NEG (02/22 1425) Rubella: 25.10 (02/22 1425) RPR: NON REAC (06/08 1130)  HBsAg: NEGATIVE (02/22 1425)  HIV: NONREACTIVE (06/08 1130)  GBS:   unknown  Assessment/Plan: 1. Chronic hypertension during pregnancy, antepartum   2. Gestational diabetes mellitus (GDM) affecting pregnancy   3. 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 Vaginal bleeding in pregnancy, third trimester     Admit to antepartum 24 urine collection BMZ x 2 doses in 24 hours Pad count to evaluate bleeding Labetalol 300 TID as prescribed Fasting and 2 hour PP CBGs and glyburide 2.5 mg daily as prescribed Continuous EFM  LEFTWICH-KIRBY, Gregor Dershem, CNM 7:54 AM

## 2015-09-04 NOTE — Op Note (Signed)
Cesarean Section Operative Report  Tammy Byrd  09/04/2015  Indications: Breech Presentation , Pre-term labor, Pre-eclampsia, gestational diabetes  Pre-operative Diagnosis: breech, in labor.   Post-operative Diagnosis: Same   Surgeon: Surgeon(s) and Role:    * Levie HeritageJacob J Stinson, DO - Primary    * Kathrynn RunningNoah Bedford Wouk, MD - Resident - Assisting      * Ernestina PennaNicholas Schenk MD- assisting.  Attending Attestation: I was present and scrubbed for the entire procedure.   Assistants: none  Anesthesia: spinal    Estimated Blood Loss: 800 ml  Total IV Fluids: 1200 ml LR  Urine Output:: 500 ml yellow urine  Specimens: blood gas  Findings: Viable female infant in breech presentation; Apgars 6 at 1 minute and 9 at 5 minutes; weight 6lbs 7 oz g; arterial cord pH pending; clear amniotic fluid; intact placenta with three vessel cord; normal uterus, fallopian tubes and ovaries bilaterally.  Baby condition / location:  Couplet care / Skin to Skin   Complications: no complications  Indications: Tammy GinsKrim Arrighi is a 35 y.o. [id number] with an IUP 2133w3d presenting with breech presentation and pre-term labor.  The risks, benefits, complications, treatment options, and exected outcomes were discussed with the patient . The patient dwith the proposed plan, giving informed consent. identified as Tammy GinsKrim Baez and the procedure verified as C-Section Delivery.  Procedure Details:  The patient was taken back to the operative suite where spinal anesthesia was placed.  A time out was held and the above information confirmed.   After induction of anesthesia, the patient was draped and prepped in the usual sterile manner and placed in a dorsal supine position with a leftward tilt. A Pfannenstiel incision was made and carried down through the subcutaneous tissue to the fascia. Fascial incision was made and extended transversely with the bovie. The fascia was separated from the underlying rectus tissue superiorly and inferiorly. The  peritoneum was identified and bluntly entered and extended longitudinally. alexis retractor was placed. A low transverse uterine incision was made and extended bluntly. After intial attempts to delivery the breech  a a 1.5cm j incision was made a left margin of the hysterotomy site with bandage scissors. The infant was then Delivered from breech presentation was a viable infant with Apgars and weight as above.  After waiting 30 seconds for delayed cord cutting, the umbilical cord was clamped and cut cord blood was obtained for evaluation. Cord ph was sent. The placenta was removed Intact and appeared normal. The uterine outline, tubes and ovaries appeared normal. The uterine incision was closed with running locked sutures of 0Vicryl with an imbricating layer of the same.   Hemostasis was observed. The peritoneum was closed with 0 vicryl. The rectus muscles were examined and hemostasis observed. The fascia was then reapproximated with running sutures of 0Vicryl.. The skin was closed with 4-0Vicryl.   Instrument, sponge, and needle counts were correct prior the abdominal closure and were correct at the conclusion of the case.     Disposition: PACU - hemodynamically stable.   Maternal Condition: stable       Signed: Les Pouicholas SchenkMD 09/04/2015 4:22 PM

## 2015-09-04 NOTE — Lactation Note (Signed)
This note was copied from a baby's chart. Lactation Consultation Note  Patient Name: Tammy Byrd ZOXWR'UToday's Date: 09/04/2015 Reason for consult: Initial assessment Baby at 5 hr of life. Experienced bf mom reports when baby stays awake she feeds well. She does not want DEBP at this time. She does not feel like she can sit up to pump or has enough energy. She bf her other children for 1.5 yr each with no issues. Discussed LPT infant behavior, feeding frequency, pumping, supplementing, baby belly size, voids, wt loss, breast changes, and nipple care. She stated she can manually express and has spoon in room. Given lactation and LPT infant handouts. Aware of OP services and support group.    Maternal Data Has patient been taught Hand Expression?: Yes Does the patient have breastfeeding experience prior to this delivery?: Yes  Feeding Feeding Type: Breast Fed Length of feed: 2 min  LATCH Score/Interventions                      Lactation Tools Discussed/Used WIC Program: No   Consult Status Consult Status: Follow-up Date: 09/05/15 Follow-up type: In-patient    Tammy Byrd 09/04/2015, 8:47 PM

## 2015-09-04 NOTE — MAU Note (Signed)
Pt reports spotting since 2 am, some pressure in lower abd

## 2015-09-04 NOTE — Transfer of Care (Signed)
Immediate Anesthesia Transfer of Care Note  Patient: Tammy Byrd  Procedure(s) Performed: Procedure(s): CESAREAN SECTION (N/A)  Patient Location: PACU  Anesthesia Type:Spinal  Level of Consciousness: awake, alert  and oriented  Airway & Oxygen Therapy: Patient Spontanous Breathing  Post-op Assessment: Report given to RN and Post -op Vital signs reviewed and stable  Post vital signs: Reviewed and stable  Last Vitals:  Filed Vitals:   09/04/15 1129 09/04/15 1356  BP: 143/80 154/68  Pulse: 74 80  Temp: 36.9 C 36.9 C  Resp: 16 16    Last Pain:  Filed Vitals:   09/04/15 1424  PainSc: 8          Complications: No apparent anesthesia complications

## 2015-09-04 NOTE — Anesthesia Procedure Notes (Signed)
Spinal Patient location during procedure: OR Preanesthetic Checklist Completed: patient identified, site marked, surgical consent, pre-op evaluation, timeout performed, IV checked, risks and benefits discussed and monitors and equipment checked Spinal Block Patient position: sitting Prep: DuraPrep Patient monitoring: cardiac monitor, continuous pulse ox, blood pressure and heart rate Approach: midline Location: L3-4 Injection technique: catheter Needle Needle type: Tuohy and Sprotte  Needle gauge: 24 G Needle length: 12.7 cm Needle insertion depth: 6 cm Catheter type: closed end flexible Catheter size: 19 g Catheter at skin depth: 12 cm Additional Notes Spinal Dosage in OR  Bupivicaine ml       1.3 PFMS04   mcg        100 Fentanyl mcg            25

## 2015-09-04 NOTE — Anesthesia Preprocedure Evaluation (Signed)
Anesthesia Evaluation  Patient identified by MRN, date of birth, ID band Patient awake    Reviewed: Allergy & Precautions, H&P , Patient's Chart, lab work & pertinent test results  Airway Mallampati: II  TM Distance: >3 FB Neck ROM: full    Dental no notable dental hx.    Pulmonary    Pulmonary exam normal breath sounds clear to auscultation       Cardiovascular Exercise Tolerance: Good hypertension,  Rhythm:regular Rate:Normal     Neuro/Psych    GI/Hepatic   Endo/Other  diabetes, Gestational  Renal/GU      Musculoskeletal   Abdominal   Peds  Hematology  (+) anemia ,   Anesthesia Other Findings   Reproductive/Obstetrics                             Anesthesia Physical Anesthesia Plan  ASA: III and emergent  Anesthesia Plan: Spinal, Epidural and Combined Spinal and Epidural   Post-op Pain Management:    Induction:   Airway Management Planned:   Additional Equipment:   Intra-op Plan:   Post-operative Plan:   Informed Consent: I have reviewed the patients History and Physical, chart, labs and discussed the procedure including the risks, benefits and alternatives for the proposed anesthesia with the patient or authorized representative who has indicated his/her understanding and acceptance.   Dental Advisory Given  Plan Discussed with: CRNA  Anesthesia Plan Comments: (Lab work confirmed with CRNA in room. Platelets okay. Discussed spinal anesthetic, and patient consents to the procedure:  included risk of possible headache,backache, failed block, allergic reaction, and nerve injury. This patient was asked if she had any questions or concerns before the procedure started. )        Anesthesia Quick Evaluation

## 2015-09-05 DIAGNOSIS — Z98891 History of uterine scar from previous surgery: Secondary | ICD-10-CM

## 2015-09-05 LAB — CBC
HCT: 26.6 % — ABNORMAL LOW (ref 36.0–46.0)
HEMOGLOBIN: 8.9 g/dL — AB (ref 12.0–15.0)
MCH: 21.6 pg — AB (ref 26.0–34.0)
MCHC: 33.5 g/dL (ref 30.0–36.0)
MCV: 64.6 fL — ABNORMAL LOW (ref 78.0–100.0)
Platelets: 178 10*3/uL (ref 150–400)
RBC: 4.12 MIL/uL (ref 3.87–5.11)
RDW: 17.4 % — ABNORMAL HIGH (ref 11.5–15.5)
WBC: 17 10*3/uL — ABNORMAL HIGH (ref 4.0–10.5)

## 2015-09-05 LAB — GLUCOSE, CAPILLARY
GLUCOSE-CAPILLARY: 63 mg/dL — AB (ref 65–99)
GLUCOSE-CAPILLARY: 72 mg/dL (ref 65–99)
GLUCOSE-CAPILLARY: 116 mg/dL — AB (ref 65–99)
GLUCOSE-CAPILLARY: 124 mg/dL — AB (ref 65–99)

## 2015-09-05 LAB — BIRTH TISSUE RECOVERY COLLECTION (PLACENTA DONATION)

## 2015-09-05 MED ORDER — HYDROCHLOROTHIAZIDE 25 MG PO TABS
25.0000 mg | ORAL_TABLET | Freq: Every day | ORAL | Status: DC
  Administered 2015-09-06: 25 mg via ORAL
  Filled 2015-09-05 (×2): qty 1

## 2015-09-05 MED ORDER — HYDROCHLOROTHIAZIDE 12.5 MG PO CAPS
12.5000 mg | ORAL_CAPSULE | Freq: Every day | ORAL | Status: DC
  Filled 2015-09-05: qty 1

## 2015-09-05 NOTE — Lactation Note (Signed)
This note was copied from a baby's chart. Lactation Consultation Note  Patient Name: Tammy Byrd [id number]'UToday's Date: 09/05/2015 Reason for consult: Follow-up assessment;Late preterm infant  LPI 24 hours old. Mom reports that the baby just finished nursing for 15 minutes and had milk dripping out of her mouth. Mom states that she nursed her 2 older girls, 1 who was 4 lbs and spent the first 2 weeks in NICU. Enc mom to post-pump and give the baby the EBM. Mom has been using a bottle for supplementation, her choice. Reviewed LPI behavior and guidelines. Enc mom to put baby to breast with cues, and at least by 3 hours. Enc mom to supplement baby after each breastfeed with EBM/formula, and then post-pump. Enc mom to call out for assistance with latching/pumping/supplementing as needed.  Maternal Data    Feeding Feeding Type: Breast Fed Length of feed: 15 min  LATCH Score/Interventions Latch: Repeated attempts needed to sustain latch, nipple held in mouth throughout feeding, stimulation needed to elicit sucking reflex. Intervention(s): Adjust position;Assist with latch;Breast massage;Breast compression  Audible Swallowing: A few with stimulation Intervention(s): Skin to skin;Hand expression  Type of Nipple: Everted at rest and after stimulation (wide)  Comfort (Breast/Nipple): Soft / non-tender     Hold (Positioning): Assistance needed to correctly position infant at breast and maintain latch. Intervention(s): Breastfeeding basics reviewed;Position options;Support Pillows;Skin to skin  LATCH Score: 7  Lactation Tools Discussed/Used Tools: Pump Breast pump type: Double-Electric Breast Pump   Consult Status Consult Status: Follow-up Date: 09/06/15 Follow-up type: In-patient    Tammy Byrd, Seaborn Nakama 09/05/2015, 3:17 PM

## 2015-09-05 NOTE — Progress Notes (Signed)
Post Partum Day 1  Subjective:  Tammy Byrd is a 35 y.o. [id number] 2611w3d s/p PLTCS.  No acute events overnight.  Pt denies problems with ambulating, voiding or po intake. Foley catheter was just removed.  She denies nausea or vomiting.  Pain is well controlled.  She has not had flatus. She has not had bowel movement.  Lochia Minimal. Notes dizziness with standing.  Plan for birth control is no method. Would like Nexplanon. Method of Feeding: Breast  Objective: BP 123/77 mmHg  Pulse 82  Temp(Src) 98.3 F (36.8 C) (Oral)  Resp 16  Ht 5\' 2"  (1.575 m)  Wt 90.719 kg (200 lb)  BMI 36.57 kg/m2  SpO2 95%  LMP 12/30/2014 (Exact Date)  Breastfeeding? Unknown  Physical Exam:  General: alert, cooperative and no distress Lochia:normal flow Chest: normal work of breathing  Heart: normal rate  Abdomen: +BS, soft, nontender, fundus firm at/below umbilicus  DVT Evaluation: No evidence of DVT seen on physical exam. Extremities: moderate edema   Recent Labs  09/04/15 1347 09/05/15 0516  HGB 10.7* 8.9*  HCT 31.8* 26.6*    Assessment/Plan:  ASSESSMENT: Tammy GinsKrim Welshans is a 35 y.o. [id number] 10811w3d ppd #1 s/p C-section doing well.   Patient stable overnight. Continue to monitor BP. Discharge tomorrow.    LOS: 1 day   Bayard HuggerLauren J Winta Barcelo 09/05/2015, 7:33 AM

## 2015-09-05 NOTE — Lactation Note (Signed)
This note was copied from a baby's chart. Lactation Consultation Note Hand expressed 2 ml from Rt. Breast. Mom holding baby in Lt. Arm. Baby swaddled, BF at intervals. Encouraged to unswaddle baby during BF.  Patient Name: Tammy Byrd Reason for consult: Late preterm infant   Maternal Data    Feeding Feeding Type: Breast Milk Length of feed: 15 min  LATCH Score/Interventions Latch: Repeated attempts needed to sustain latch, nipple held in mouth throughout feeding, stimulation needed to elicit sucking reflex.  Audible Swallowing: A few with stimulation Intervention(s): Hand expression  Type of Nipple: Everted at rest and after stimulation  Comfort (Breast/Nipple): Soft / non-tender     Hold (Positioning): No assistance needed to correctly position infant at breast.  LATCH Score: 8  Lactation Tools Discussed/Used Tools: Pump Breast pump type: Double-Electric Breast Pump Pump Review: Setup, frequency, and cleaning;Milk Storage Initiated by:: Peri JeffersonL. Jacaden Forbush RN Date initiated:: 09/05/15   Consult Status Consult Status: Follow-up Date: 09/06/15 Follow-up type: In-patient    Lawyer Washabaugh, Diamond NickelLAURA G Byrd, 5:08 AM

## 2015-09-05 NOTE — Progress Notes (Signed)
Inpatient Diabetes Program Recommendations  AACE/ADA: New Consensus Statement on Inpatient Glycemic Control (2015)  Target Ranges:  Prepandial:   less than 140 mg/dL      Peak postprandial:   less than 180 mg/dL (1-2 hours)      Critically ill patients:  140 - 180 mg/dL   Results for Tammy Byrd, Tammy Byrd (MRN [id number]) as of 09/05/2015 08:58  Ref. Range 09/04/2015 12:02 09/04/2015 13:57 09/04/2015 16:31 09/04/2015 22:40 09/05/2015 06:25  Glucose-Capillary Latest Ref Range: 65-99 mg/dL 84 89 956113 (H) 213123 (H) 63 (L)   Review of Glycemic Control  Diabetes history: GDM Outpatient Diabetes medications: Glyburide 2.5 mg QHS Current orders for Inpatient glycemic control: Glyburide 2.5 mg QHS  Inpatient Diabetes Program Recommendations: Oral Agents: Fasting glucose 63 mg/dl this morning. Note patient had GDM and was taking Glyburide 2.5 mg QHS for glycemic control and patient received Glyburide 2.5 mg last night. Since patient has delivered would anticipate glucose to return to normal. Recommend discontinuing Glyburide, monitor glucose trends, and advise patient to follow up with PCP regarding glycemic control.  Thanks, Orlando PennerMarie Melville Engen, RN, MSN, CDE Diabetes Coordinator Inpatient Diabetes Program [phone] (Team Pager from 8am to 5pm) [phone] ([facility] office) [phone] [facility] office) [phone] [facility] office)

## 2015-09-05 NOTE — Lactation Note (Signed)
This note was copied from a baby's chart. Lactation Consultation Note Baby is 5535 253/147 weeks old. Mom states she is feels that she has enough to provide for baby. Encouraged to hand express after. Discussed w/mom about supplementing. Mom prefers to give her colostrum and not formula. Encouraged to give breast milk after feeding. Has had minmal weight loss. Mom shown how to use DEBP & how to disassemble, clean, & reassemble parts.Mom knows to pump q3h for 15-20 min.Mom encouraged to do skin-to-skin. Patient Name: Tammy Byrd [id number]'s Date: 09/05/2015 Reason for consult: Follow-up assessment;Late preterm infant   Maternal Data    Feeding Feeding Type: Breast Fed Length of feed: 15 min  LATCH Score/Interventions Latch: Repeated attempts needed to sustain latch, nipple held in mouth throughout feeding, stimulation needed to elicit sucking reflex.  Audible Swallowing: A few with stimulation Intervention(s): Hand expression  Type of Nipple: Everted at rest and after stimulation  Comfort (Breast/Nipple): Soft / non-tender     Hold (Positioning): No assistance needed to correctly position infant at breast.  LATCH Score: 8  Lactation Tools Discussed/Used     Consult Status Consult Status: Follow-up Date: 09/06/15 Follow-up type: In-patient    Charyl DancerCARVER, Corderius Saraceni G 09/05/2015, 4:31 AM

## 2015-09-06 ENCOUNTER — Other Ambulatory Visit: Payer: BLUE CROSS/BLUE SHIELD | Admitting: Obstetrics & Gynecology

## 2015-09-06 ENCOUNTER — Ambulatory Visit (HOSPITAL_COMMUNITY)
Admission: RE | Admit: 2015-09-06 | Discharge: 2015-09-06 | Disposition: A | Discharge status: Home / Self Care | Payer: BLUE CROSS/BLUE SHIELD | Source: Ambulatory Visit | Attending: Obstetrics & Gynecology | Admitting: Obstetrics & Gynecology

## 2015-09-06 MED ORDER — HYDROCHLOROTHIAZIDE 25 MG PO TABS: 25.0000 mg | ORAL_TABLET | Freq: Every day | ORAL | Status: DC

## 2015-09-06 MED ORDER — OXYCODONE HCL 5 MG PO TABS: 5.0000 mg | ORAL_TABLET | ORAL | Status: DC | PRN

## 2015-09-06 MED ORDER — ACETAMINOPHEN 325 MG PO TABS: 650.0000 mg | ORAL_TABLET | ORAL | Status: DC | PRN

## 2015-09-06 NOTE — Discharge Summary (Signed)
OB Discharge Summary     Patient Name: Tammy Byrd DOB: 12/13/1980 MRN: [id number]  Date of admission: 09/04/2015 Delivering MD: Laurey Arrow BEDFORD   Date of discharge: 09/06/2015  Admitting diagnosis: 6 WEEKS LIGHT SPOTTING Intrauterine pregnancy: [redacted]w[redacted]d    Secondary diagnosis:  Active Problems:   History of preterm delivery   Vaginal bleeding in pregnancy   Status post primary low transverse cesarean section  Additional problems: Pre-Eclampsia, GDMA2     Discharge diagnosis: Preterm Pregnancy Delivered, Preeclampsia (mild) and GDM A2                                                                                                Post partum procedures:none  Augmentation: N/a  Complications: None  Hospital course:  Onset of Labor With Unplanned C/S  35y.o. yo [id number] 361w3das admitted to antepartum unit for vaginal bleeding and pre-eclampsia on 09/04/2015. Patient began to have contraction and change her cervix. Baby was found to be breech and pt was taken for PLTCS. Membrane Rupture Time/Date: 2:54 PM ,09/04/2015   The patient went for cesarean section due to Malpresentation, and delivered a Viable infant,09/04/2015  Details of operation can be found in separate operative note. Patient had an uncomplicated postpartum course.  She is ambulating,tolerating a regular diet, passing flatus, and urinating well.  Patient is discharged home in stable condition 09/06/2015.   Pt was a GDMA2 she was maintained on GLyburide at bed time, however post partum she has sugars as low as 60's so was D/C on discharge. Pt was on labetolol during pregnancy, but was discharged on HCTZ after good control on HCTZ post partum.  Physical exam  Filed Vitals:   09/05/15 0930 09/05/15 1330 09/05/15 1834 09/06/15 0631  BP: 126/80 111/63 140/88 130/60  Pulse: 85 79 83 78  Temp: 98.2 F (36.8 C) 98.2 F (36.8 C) 97.8 F (36.6 C)   TempSrc: Oral Oral    Resp: 18 16 18 18   Height:      Weight:      SpO2:  98% 95%     General: alert, cooperative and no distress Lochia: appropriate Uterine Fundus: firm Incision: Dressing is clean, dry, and intact DVT Evaluation: No evidence of DVT seen on physical exam. No significant calf/ankle edema. Labs: Lab Results  Component Value Date   WBC 17.0* 09/05/2015   HGB 8.9* 09/05/2015   HCT 26.6* 09/05/2015   MCV 64.6* 09/05/2015   PLT 178 09/05/2015   CMP Latest Ref Rng 09/04/2015  Glucose 65 - 99 mg/dL 76  BUN 6 - 20 mg/dL 7  Creatinine 0.44 - 1.00 mg/dL 0.35(L)  Sodium 135 - 145 mmol/L 136  Potassium 3.5 - 5.1 mmol/L 4.3  Chloride 101 - 111 mmol/L 109  CO2 22 - 32 mmol/L 21(L)  Calcium 8.9 - 10.3 mg/dL 9.2  Total Protein 6.5 - 8.1 g/dL 5.6(L)  Total Bilirubin 0.3 - 1.2 mg/dL 0.4  Alkaline Phos 38 - 126 U/L 84  AST 15 - 41 U/L 28  ALT 14 - 54 U/L 12(L)    Discharge instruction: per  After Visit Summary and "Baby and Me Booklet".  After visit meds:    Medication List    STOP taking these medications        ACCU-CHEK AVIVA PLUS w/Device Kit     ACCU-CHEK FASTCLIX LANCETS Misc     glucose blood test strip  Commonly known as:  ACCU-CHEK AVIVA PLUS     glyBURIDE 2.5 MG tablet  Commonly known as:  DIABETA     labetalol 300 MG tablet  Commonly known as:  NORMODYNE     prenatal multivitamin Tabs tablet      TAKE these medications        acetaminophen 325 MG tablet  Commonly known as:  TYLENOL  Take 2 tablets (650 mg total) by mouth every 4 (four) hours as needed (for pain scale < 4  OR  temperature  >/=  100.5 F).     hydrochlorothiazide 25 MG tablet  Commonly known as:  HYDRODIURIL  Take 1 tablet (25 mg total) by mouth daily.     oxyCODONE 5 MG immediate release tablet  Commonly known as:  Oxy IR/ROXICODONE  Take 1 tablet (5 mg total) by mouth every 4 (four) hours as needed (pain scale 4-7).        Diet: routine diet  Activity: Advance as tolerated. Pelvic rest for 6 weeks.   Outpatient follow up:4 weeks Follow up  Appt:Future Appointments Date Time Provider [place]  09/06/2015 9:30 AM WH-MFC [place] 1 WH-MFCUS MFC-US   Follow up Visit:No Follow-up on file.  Postpartum contraception: Nexplanon  Newborn Data: Live born female  Birth Weight: 6 lb 7.2 oz (2925 g) APGAR: 6, 9  Baby Feeding: Bottle and Breast Disposition:home with mother   09/06/2015 Jacquiline Doe, MD

## 2015-09-06 NOTE — Discharge Instructions (Signed)
Cesarean Delivery, Care After  Refer to this sheet in the next few weeks. These instructions provide you with information on caring for yourself after your procedure. Your health care provider may also give you specific instructions. Your treatment has been planned according to current medical practices, but problems sometimes occur. Call your health care provider if you have any problems or questions after you go home.  HOME CARE INSTRUCTIONS   Only take over-the-counter or prescription medications as directed by your health care provider.   Do not drink alcohol, especially if you are breastfeeding or taking medication to relieve pain.   Do not chew or smoke tobacco.   Continue to use good perineal care. Good perineal care includes:    Wiping your perineum from front to back.    Keeping your perineum clean.   Check your surgical cut (incision) daily for increased redness, drainage, swelling, or separation of skin.   Clean your incision gently with soap and water every day, and then pat it dry. If your health care provider says it is okay, leave the incision uncovered. Use a bandage (dressing) if the incision is draining fluid or appears irritated. If the adhesive strips across the incision do not fall off within 7 days, carefully peel them off.   Hug a pillow when coughing or sneezing until your incision is healed. This helps to relieve pain.   Do not use tampons or douche until your health care provider says it is okay.   Shower, wash your hair, and take tub baths as directed by your health care provider.   Wear a well-fitting bra that provides breast support.   Limit wearing support panties or control-top hose.   Drink enough fluids to keep your urine clear or pale yellow.   Eat high-fiber foods such as whole grain cereals and breads, brown rice, beans, and fresh fruits and vegetables every day. These foods may help prevent or relieve constipation.   Resume activities such as climbing stairs,  driving, lifting, exercising, or traveling as directed by your health care provider.   Talk to your health care provider about resuming sexual activities. This is dependent upon your risk of infection, your rate of healing, and your comfort and desire to resume sexual activity.   Try to have someone help you with your household activities and your newborn for at least a few days after you leave the hospital.   Rest as much as possible. Try to rest or take a nap when your newborn is sleeping.   Increase your activities gradually.   Keep all of your scheduled postpartum appointments. It is very important to keep your scheduled follow-up appointments. At these appointments, your health care provider will be checking to make sure that you are healing physically and emotionally.  SEEK MEDICAL CARE IF:    You are passing large clots from your vagina. Save any clots to show your health care provider.   You have a foul smelling discharge from your vagina.   You have trouble urinating.   You are urinating frequently.   You have pain when you urinate.   You have a change in your bowel movements.   You have increasing redness, pain, or swelling near your incision.   You have pus draining from your incision.   Your incision is separating.   You have painful, hard, or reddened breasts.   You have a severe headache.   You have blurred vision or see spots.   You feel sad   or depressed.   You have thoughts of hurting yourself or your newborn.   You have questions about your care, the care of your newborn, or medications.   You are dizzy or light-headed.   You have a rash.   You have pain, redness, or swelling at the site of the removed intravenous access (IV) tube.   You have nausea or vomiting.   You stopped breastfeeding and have not had a menstrual period within 12 weeks of stopping.   You are not breastfeeding and have not had a menstrual period within 12 weeks of delivery.   You have a fever.  SEEK  IMMEDIATE MEDICAL CARE IF:   You have persistent pain.   You have chest pain.   You have shortness of breath.   You faint.   You have leg pain.   You have stomach pain.   Your vaginal bleeding saturates 2 or more sanitary pads in 1 hour.  MAKE SURE YOU:    Understand these instructions.   Will watch your condition.   Will get help right away if you are not doing well or get worse.     This information is not intended to replace advice given to you by your health care provider. Make sure you discuss any questions you have with your health care provider.     Document Released: 10/26/2001 Document Revised: 02/24/2014 Document Reviewed: 10/01/2011  Elsevier Interactive Patient Education 2016 [employer]

## 2015-09-07 ENCOUNTER — Encounter (HOSPITAL_COMMUNITY): Payer: Self-pay | Admitting: Family Medicine

## 2015-10-16 ENCOUNTER — Ambulatory Visit (INDEPENDENT_AMBULATORY_CARE_PROVIDER_SITE_OTHER): Payer: BLUE CROSS/BLUE SHIELD | Admitting: Family

## 2015-10-16 ENCOUNTER — Encounter: Payer: Self-pay | Admitting: Family

## 2015-10-16 DIAGNOSIS — Z30017 Encounter for initial prescription of implantable subdermal contraceptive: Secondary | ICD-10-CM

## 2015-10-16 DIAGNOSIS — O24419 Gestational diabetes mellitus in pregnancy, unspecified control: Secondary | ICD-10-CM | POA: Insufficient documentation

## 2015-10-16 DIAGNOSIS — Z3202 Encounter for pregnancy test, result negative: Secondary | ICD-10-CM | POA: Diagnosis not present

## 2015-10-16 DIAGNOSIS — Z8632 Personal history of gestational diabetes: Secondary | ICD-10-CM

## 2015-10-16 LAB — POCT PREGNANCY, URINE: PREG TEST UR: NEGATIVE

## 2015-10-16 NOTE — Progress Notes (Signed)
Subjective:     Tammy Byrd is a 35 y.o. female who presents for a postpartum visit. She is 6 weeks postpartum following a low cervical transverse Cesarean section for preterm labor and breech presentation. I have fully reviewed the prenatal and intrapartum course. The delivery was at 35 gestational weeks. Outcome: primary cesarean section, low transverse incision. Anesthesia: spinal. Postpartum course has been unremarkable, blood pressure and glucose normal at home. Baby's course has been unremarkable. Baby is feeding by breast. Bleeding no bleeding. Bleeding lasted 4 weeks.  Bowel function is normal. Bladder function is normal. Patient is not sexually active. Contraception method is none.  Desires Nexplanon.   Postpartum depression screening: negative.  The following portions of the patient's history were reviewed and updated as appropriate: allergies, current medications, past family history, past medical history, past social history, past surgical history and problem list.  Review of Systems Pertinent items are noted in HPI.   Objective:    BP (!) 142/88   Pulse 73   Wt 168 lb 3.2 oz (76.3 kg)   BMI 30.76 kg/m        General:  alert, cooperative and appears stated age   Breasts:  inspection negative, no nipple discharge or bleeding, no masses or nodularity palpable  Lungs: clear to auscultation bilaterally  Heart:  regular rate and rhythm, S1, S2 normal, no murmur, click, rub or gallop  Abdomen: soft, non-tender; bowel sounds normal; no masses,  no organomegaly; incision without signs of infection   Pelvic exam not indicated - not bleeding, no pain at vaginal area  NEXPLANON INSERTION: Appropriate time out taken. Nexlanon site (left arm) identified and thea area was prepped in usual sterile fashon. 2 cc of 1% lidocaine was used to anesthetize the area starting with the distal end.   Next, the area was cleansed with betadine and the Nexplanon was inserted without difficulty.  Pressure  bandage was applied.  Pt was instructed to remove pressure bandage in a few hours, and keep insertion site covered with a bandaid for 3 days.  Assessment:   Abnormal 1 hour GTT in Pregnancy Nexplanon Insertion   Normal postpartum exam. Pap smear not done at today's visit.   Plan:    1. Contraception: Nexplanon 2. Continue to monitor blood pressure and glucose 3. Follow up in: 4 weeks for 2 hr OGT.    Eino FarberWalidah Kennith GainN Karim, CNM

## 2015-11-13 ENCOUNTER — Other Ambulatory Visit: Payer: BLUE CROSS/BLUE SHIELD

## 2015-11-13 DIAGNOSIS — O99814 Abnormal glucose complicating childbirth: Secondary | ICD-10-CM

## 2015-11-14 ENCOUNTER — Telehealth: Payer: Self-pay | Admitting: Family

## 2015-11-14 LAB — GLUCOSE TOLERANCE, 2 HOURS
GLUCOSE, 2 HOUR: 119 mg/dL (ref <140)
Glucose, Fasting: 91 mg/dL (ref 65–99)

## 2015-11-14 NOTE — Telephone Encounter (Signed)
Notified that 2 hr normal

## 2015-12-29 ENCOUNTER — Ambulatory Visit (INDEPENDENT_AMBULATORY_CARE_PROVIDER_SITE_OTHER): Payer: BLUE CROSS/BLUE SHIELD | Admitting: Physician Assistant

## 2015-12-29 VITALS — BP 120/82 | HR 123 | Temp 99.1°F | Resp 17 | Ht 62.0 in | Wt 166.0 lb

## 2015-12-29 DIAGNOSIS — R509 Fever, unspecified: Secondary | ICD-10-CM | POA: Diagnosis not present

## 2015-12-29 DIAGNOSIS — R300 Dysuria: Secondary | ICD-10-CM

## 2015-12-29 DIAGNOSIS — D72829 Elevated white blood cell count, unspecified: Secondary | ICD-10-CM

## 2015-12-29 DIAGNOSIS — N390 Urinary tract infection, site not specified: Secondary | ICD-10-CM

## 2015-12-29 DIAGNOSIS — R109 Unspecified abdominal pain: Secondary | ICD-10-CM

## 2015-12-29 LAB — POCT CBC
Granulocyte percent: 87.2 % — AB (ref 37–80)
HCT, POC: 34.5 % — AB (ref 37.7–47.9)
Hemoglobin: 11.7 g/dL — AB (ref 12.2–16.2)
Lymph, poc: 1.4 (ref 0.6–3.4)
MCH, POC: 22.5 pg — AB (ref 27–31.2)
MCHC: 34 g/dL (ref 31.8–35.4)
MCV: 66.1 fL — AB (ref 80–97)
MID (cbc): 0.3 (ref 0–0.9)
MPV: 10.4 fL (ref 0–99.8)
POC Granulocyte: 11.6 — AB (ref 2–6.9)
POC LYMPH PERCENT: 10.3 % (ref 10–50)
POC MID %: 2.5 % (ref 0–12)
Platelet Count, POC: 280 K/uL (ref 142–424)
RBC: 5.22 M/uL (ref 4.04–5.48)
RDW, POC: 15.9 %
WBC: 13.3 K/uL — AB (ref 4.6–10.2)

## 2015-12-29 LAB — POC MICROSCOPIC URINALYSIS (UMFC): Mucus: ABSENT

## 2015-12-29 LAB — POCT URINALYSIS DIP (MANUAL ENTRY)
Bilirubin, UA: NEGATIVE
Glucose, UA: NEGATIVE
Nitrite, UA: POSITIVE — AB
Protein Ur, POC: 100 — AB
Spec Grav, UA: 1.015
Urobilinogen, UA: 0.2
pH, UA: 6

## 2015-12-29 MED ORDER — CEFTRIAXONE SODIUM 1 G IJ SOLR
1.0000 g | Freq: Once | INTRAMUSCULAR | Status: AC
  Administered 2015-12-29: 1 g via INTRAMUSCULAR

## 2015-12-29 MED ORDER — CEFIXIME 400 MG PO CAPS: 400.0000 mg | ORAL_CAPSULE | Freq: Every day | ORAL | 0 refills | Status: AC

## 2015-12-29 NOTE — Progress Notes (Signed)
Tammy Byrd  MRN: [id number] DOB: 11/04/1980  PCP: No primary care provider on file.  Subjective:  Pt is a pleasant 35 year old female who presents to clinic for urinary symptoms for two weeks.  Increased frequency two weeks ago, a few days later progressed to a burning sensation at the end of her stream, she notes decreased urination but feels like she has to urinate.  Last night she had sweats and cold chills, mild low back pain and left-sided back pain. States the worst symptom is the pain with urination.   Took OTC AZO, worked one day. Drank cranberry juice, did not help.  Currently breastfeeding 453 month old baby.  No history of UTIs.   Review of Systems  Constitutional: Positive for chills and fever. Negative for fatigue.  Respiratory: Negative for cough, shortness of breath and wheezing.   Cardiovascular: Negative for chest pain and palpitations.  Gastrointestinal: Negative for abdominal pain, diarrhea, nausea and vomiting.  Genitourinary: Positive for dysuria, frequency and urgency. Negative for decreased urine volume, difficulty urinating, enuresis, flank pain and hematuria.  Musculoskeletal: Negative for back pain.  Neurological: Negative for dizziness, weakness, light-headedness and headaches.    Patient Active Problem List   Diagnosis Date Noted  . History of gestational diabetes 10/16/2015  . Status post primary low transverse cesarean section 09/05/2015  . History of preterm delivery 09/04/2015  . History of severe pre-eclampsia 08/16/2015  . 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 Sickle cell trait (HCC) 04/14/2015    Current Outpatient Prescriptions on File Prior to Visit  Medication Sig Dispense Refill  . acetaminophen (TYLENOL) 325 MG tablet Take 2 tablets (650 mg total) by mouth every 4 (four) hours as needed (for pain scale < 4  OR  temperature  >/=  100.5 F). 30 tablet 1  . oxyCODONE (OXY  IR/ROXICODONE) 5 MG immediate release tablet Take 1 tablet (5 mg total) by mouth every 4 (four) hours as needed (pain scale 4-7). (Patient not taking: Reported on 12/29/2015) 20 tablet 0   No current facility-administered medications on file prior to visit.     No Known Allergies   Objective:  BP 120/82 (BP Location: Right Arm, Patient Position: Sitting, Cuff Size: Normal)   Pulse (!) 123   Temp 99.1 F (37.3 C) (Oral)   Resp 17   Ht 5\' 2"  (1.575 m)   Wt 166 lb (75.3 kg)   SpO2 99%   BMI 30.36 kg/m   Physical Exam  Constitutional: She is oriented to person, place, and time and well-developed, well-nourished, and in no distress. No distress.  Cardiovascular: Normal rate, regular rhythm and normal heart sounds.   Abdominal: There is tenderness in the suprapubic area. There is CVA tenderness (mild, left flank).    Well healed c-section scar lower abdomen.   Neurological: She is alert and oriented to person, place, and time. GCS score is 15.  Skin: Skin is warm and dry.  Psychiatric: Mood, memory, affect and judgment normal.  Vitals reviewed.  Results for orders placed or performed in visit on 12/29/15  POCT urinalysis dipstick  Result Value Ref Range   Color, UA yellow yellow   Clarity, UA cloudy (A) clear   Glucose, UA negative negative   Bilirubin, UA negative negative   Ketones, POC UA small (15) (A) negative   Spec Grav, UA 1.015    Blood, UA small (A) negative   pH, UA 6.0  Protein Ur, POC =100 (A) negative   Urobilinogen, UA 0.2    Nitrite, UA Positive (A) Negative   Leukocytes, UA moderate (2+) (A) Negative  POCT Microscopic Urinalysis ([facility])  Result Value Ref Range   WBC,UR,HPF,POC Too numerous to count  (A) None WBC/hpf   RBC,UR,HPF,POC Few (A) None RBC/hpf   Bacteria Few (A) None, Too numerous to count   Mucus Absent Absent   Epithelial Cells, UR Per Microscopy Few (A) None, Too numerous to count cells/hpf  POCT CBC  Result Value Ref Range   WBC 13.3  (A) 4.6 - 10.2 K/uL   Lymph, poc 1.4 0.6 - 3.4   POC LYMPH PERCENT 10.3 10 - 50 %L   MID (cbc) 0.3 0 - 0.9   POC MID % 2.5 0 - 12 %M   POC Granulocyte 11.6 (A) 2 - 6.9   Granulocyte percent 87.2 (A) 37 - 80 %G   RBC 5.22 4.04 - 5.48 M/uL   Hemoglobin 11.7 (A) 12.2 - 16.2 g/dL   HCT, POC 60.434.5 (A) 54.037.7 - 47.9 %   MCV 66.1 (A) 80 - 97 fL   MCH, POC 22.5 (A) 27 - 31.2 pg   MCHC 34.0 31.8 - 35.4 g/dL   RDW, POC 98.115.9 %   Platelet Count, POC 280 142 - 424 K/uL   MPV 10.4 0 - 99.8 fL    Assessment and Plan :  1. Dysuria 2. Fever, unspecified fever cause 3. Leukocytosis, unspecified type 4. Urinary tract infection without hematuria, site unspecified 5. Flank pain 6. Breast feeding status of mother - POCT urinalysis dipstick - POCT Microscopic Urinalysis ([facility]) - Urine culture - POCT CBC - cefTRIAXone (ROCEPHIN) injection 1 g; Inject 1 g into the muscle once. - Cefixime (SUPRAX) 400 MG CAPS capsule; Take 1 capsule (400 mg total) by mouth daily.  Dispense: 14 capsule; Refill: 0 - Concern for possible pyelonephritis. Will treat accordingly. Supportive care: Drink plenty of fluids.  Advised to take entire course of antibiotics even if she feels improvement. RTC in 3-5 days if symptoms persist/worsen.   Marco CollieWhitney Remmington Urieta, PA-C  [facility] 12/29/2015 10:13 AM

## 2015-12-29 NOTE — Patient Instructions (Addendum)
Both medications you receive today are commonly used in breast feeding with no reports of infant adverse effects. Please take the ENTIRE COURSE of your medication, even if you start to feel better. If you fail to finish the course, your infection may not be completely cleared and your symptoms can return.  Drink plenty of water (at least 64 oz per day). Take antibiotic as prescribed until finished. Cranberry juice/pills can help. You can buy AZO over the counter and take as needed for pain. Follow directions on the packaging. I will call you with results from your urine culture. If symptoms are not improving in 3-5 days, return to clinic.   Asymptomatic Bacteriuria, Female Asymptomatic bacteriuria is the presence of a large number of bacteria in your urine without the usual symptoms of burning or frequent urination. The following conditions increase the risk of asymptomatic bacteriuria:  Diabetes mellitus.  Advanced age.  Pregnancy in the first trimester.  Kidney stones.  Kidney transplants.  Leaky kidney tube valve in young children (reflux). Treatment for this condition is not needed in most people and can lead to other problems such as too much yeast and growth of resistant bacteria. However, some people, such as pregnant women, do need treatment to prevent kidney infection. Asymptomatic bacteriuria in pregnancy is also associated with fetal growth restriction, premature labor, and newborn death. HOME CARE INSTRUCTIONS Monitor your condition for any changes. The following actions may help to relieve any discomfort you are feeling:  Drink enough water and fluids to keep your urine clear or pale yellow. Go to the bathroom more often to keep your bladder empty.  Keep the area around your vagina and rectum clean. Wipe yourself from front to back after urinating. SEEK IMMEDIATE MEDICAL CARE IF:  You develop signs of an infection such as:  Burning with urination.  Frequency of  voiding.  Back pain.  Fever.  You have blood in the urine.  You develop a fever. MAKE SURE YOU:  Understand these instructions.  Will watch your condition.  Will get help right away if you are not doing well or get worse.   This information is not intended to replace advice given to you by your health care provider. Make sure you discuss any questions you have with your health care provider.   Document Released: 02/03/2005 Document Revised: 02/24/2014 Document Reviewed: 07/26/2012 Elsevier Interactive Patient Education 2016 [employer]  IF you received an x-ray today, you will receive an invoice from [facility]. Please contact [facility] at [phone] with questions or concerns regarding your invoice.   IF you received labwork today, you will receive an invoice from [facility]. Please contact [facility] at [phone] with questions or concerns regarding your invoice.   Our billing staff will not be able to assist you with questions regarding bills from these companies.  You will be contacted with the lab results as soon as they are available. The fastest way to get your results is to activate your My Chart account. Instructions are located on the last page of this paperwork. If you have not heard from us regarding the results in 2 weeks, please contact this office.

## 2015-12-31 LAB — URINE CULTURE

## 2016-05-15 ENCOUNTER — Ambulatory Visit (INDEPENDENT_AMBULATORY_CARE_PROVIDER_SITE_OTHER): Payer: BLUE CROSS/BLUE SHIELD | Admitting: Physician Assistant

## 2016-05-15 VITALS — BP 122/74 | HR 84 | Temp 99.2°F | Resp 16 | Ht 62.5 in | Wt 161.4 lb

## 2016-05-15 DIAGNOSIS — K649 Unspecified hemorrhoids: Secondary | ICD-10-CM

## 2016-05-15 DIAGNOSIS — K5900 Constipation, unspecified: Secondary | ICD-10-CM | POA: Diagnosis not present

## 2016-05-15 DIAGNOSIS — K921 Melena: Secondary | ICD-10-CM | POA: Diagnosis not present

## 2016-05-15 DIAGNOSIS — R109 Unspecified abdominal pain: Secondary | ICD-10-CM

## 2016-05-15 LAB — POCT CBC
Granulocyte percent: 67.8 %G (ref 37–80)
HCT, POC: 37.1 % — AB (ref 37.7–47.9)
Hemoglobin: 12.4 g/dL (ref 12.2–16.2)
LYMPH, POC: 2.3 (ref 0.6–3.4)
MCH: 22.9 pg — AB (ref 27–31.2)
MCHC: 33.5 g/dL (ref 31.8–35.4)
MCV: 68.3 fL — AB (ref 80–97)
MID (CBC): 0.1 (ref 0–0.9)
MPV: 10.7 fL (ref 0–99.8)
PLATELET COUNT, POC: 284 10*3/uL (ref 142–424)
POC Granulocyte: 5.2 (ref 2–6.9)
POC LYMPH %: 30.3 % (ref 10–50)
POC MID %: 1.9 %M (ref 0–12)
RBC: 5.43 M/uL (ref 4.04–5.48)
RDW, POC: 14.6 %
WBC: 7.6 10*3/uL (ref 4.6–10.2)

## 2016-05-15 LAB — POC HEMOCCULT BLD/STL (OFFICE/1-CARD/DIAGNOSTIC): FECAL OCCULT BLD: POSITIVE — AB

## 2016-05-15 MED ORDER — HYDROCORTISONE ACETATE 25 MG RE SUPP: 25.0000 mg | Freq: Two times a day (BID) | RECTAL | 0 refills | Status: DC

## 2016-05-15 NOTE — Progress Notes (Signed)
PRIMARY CARE AT [facility] [address], [place] [address] [phone]  Date:  05/15/2016   Name:  Tammy Byrd   DOB:  23-Dec-1980   MRN:  [id number]  PCP:  No PCP Per Patient    History of Present Illness:  Tammy Byrd is a 36 y.o. female patient who presents to PCP with  Chief Complaint  Patient presents with  . Constipation    per patient blood in stool - yesterday     Patient is here for cc of blood in stool which started yesterday. It was enough to redden the water.    There is cramping in her lower abdomen.  No fevers.  No hx of diverticulitis.  She has not had an abdominal pain today.  No hx of inflammatory bowel disease.  She has never had this sxs before.  She was constipated the night before her symptoms, and then this morning she had the bloody stool/water.  This was then followed by diarrhea.   She takes nothing for constipation.  She hydrates well.  She rarely eats fiber.   She had a hemorrhoid with her pregnancy about 8 months ago.  Patient Active Problem List   Diagnosis Date Noted  . History of gestational diabetes 10/16/2015  . Status post primary low transverse cesarean section 09/05/2015  . History of preterm delivery 09/04/2015  . History of severe pre-eclampsia 08/16/2015  . 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 Sickle cell trait (HCC) 04/14/2015    Past Medical History:  Diagnosis Date  . 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 GDM (gestational diabetes mellitus)   . History of severe pre-eclampsia     Past Surgical History:  Procedure Laterality Date  . CESAREAN SECTION N/A 09/04/2015   Procedure: CESAREAN SECTION;  Surgeon: Levie Heritage, DO;  Location: [facility] BIRTHING SUITES;  Service: Obstetrics;  Laterality: N/A;  . NO PAST SURGERIES      Social History  Substance Use Topics  . Smoking status: Never Smoker  . Smokeless tobacco: Never Used  . Alcohol use No    No family  history on file.  No Known Allergies  Medication list has been reviewed and updated.  Current Outpatient Prescriptions on File Prior to Visit  Medication Sig Dispense Refill  . acetaminophen (TYLENOL) 325 MG tablet Take 2 tablets (650 mg total) by mouth every 4 (four) hours as needed (for pain scale < 4  OR  temperature  >/=  100.5 F). 30 tablet 1  . oxyCODONE (OXY IR/ROXICODONE) 5 MG immediate release tablet Take 1 tablet (5 mg total) by mouth every 4 (four) hours as needed (pain scale 4-7). (Patient not taking: Reported on 12/29/2015) 20 tablet 0   No current facility-administered medications on file prior to visit.     ROS ROS otherwise unremarkable unless listed above.  Physical Examination: BP 122/74 (BP Location: Right Arm, Patient Position: Sitting, Cuff Size: Normal)   Pulse 84   Temp 99.2 F (37.3 C) (Oral)   Resp 16   Ht 5' 2.5" (1.588 m)   Wt 161 lb 6.4 oz (73.2 kg)   SpO2 97%   BMI 29.05 kg/m  Ideal Body Weight: Weight in (lb) to have BMI = 25: 138.6  Physical Exam  Constitutional: She is oriented to person, place, and time. She appears well-developed and well-nourished. No distress.  HENT:  Head: Normocephalic and atraumatic.  Right Ear: External ear normal.  Left Ear: External ear normal.  Eyes: Conjunctivae and EOM are normal. Pupils are equal, round, and reactive to light.  Cardiovascular: Normal rate and regular rhythm.   Murmur (mild systolic murmur appreciated at the rsb) heard. Pulmonary/Chest: Effort normal. No respiratory distress.  Abdominal: Soft. Normal appearance. There is tenderness in the left lower quadrant.  Genitourinary: Pelvic exam was performed with patient in the knee-chest position.  Genitourinary Comments: 2 small non-thrombosed or bleeding hemorrhoids.  No frank blood with rectal exam.  No tenderness.    Neurological: She is alert and oriented to person, place, and time.  Skin: She is not diaphoretic.  Psychiatric: She has a normal  mood and affect. Her behavior is normal.   Results for orders placed or performed in visit on 05/15/16  POCT CBC  Result Value Ref Range   WBC 7.6 4.6 - 10.2 K/uL   Lymph, poc 2.3 0.6 - 3.4   POC LYMPH PERCENT 30.3 10 - 50 %L   MID (cbc) 0.1 0 - 0.9   POC MID % 1.9 0 - 12 %M   POC Granulocyte 5.2 2 - 6.9   Granulocyte percent 67.8 37 - 80 %G   RBC 5.43 4.04 - 5.48 M/uL   Hemoglobin 12.4 12.2 - 16.2 g/dL   HCT, POC 60.437.1 (A) 54.037.7 - 47.9 %   MCV 68.3 (A) 80 - 97 fL   MCH, POC 22.9 (A) 27 - 31.2 pg   MCHC 33.5 31.8 - 35.4 g/dL   RDW, POC 98.114.6 %   Platelet Count, POC 284 142 - 424 K/uL   MPV 10.7 0 - 99.8 fL  POC Hemoccult Bld/Stl (1-Cd Office Dx)  Result Value Ref Range   Card #1 Date 05-15-16    Fecal Occult Blood, POC Positive (A) Negative     Assessment and Plan: Tammy Byrd is a 36 y.o. female who is here today for cc of blood in stool. Diff: hemorrhoid (internal), diverticulitis. Cbc is normal without elevated wbc and hgb.  Low likelihood of diverticulitis, in young woman. I have advised that she go on a clear liquid diet in the meantime.  Then we will progress to soft foods, to bland.  Discussed alarming sxs to warrant returning immediately She is given anusol for treatment today.   rtc as needed. Blood in stool - Plan: POCT CBC, POC Hemoccult Bld/Stl (1-Cd Office Dx)  Hemorrhoids, unspecified hemorrhoid type - Plan: hydrocortisone (ANUSOL-HC) 25 MG suppository  Constipation, unspecified constipation type  Abdominal cramping - Plan: POCT CBC, POC Hemoccult Bld/Stl (1-Cd Office Dx)  Trena PlattStephanie Leanthony Rhett, PA-C [facility] 3/29/20183:08 PM

## 2016-05-15 NOTE — Patient Instructions (Addendum)
I would like you to have a liquid diet over the next 72hours.  Then soft foods only for 4 days.  Then you can reintroduce foods that are more bland, then increase. This is likely hemorrhoids, but we can make sure and ward off diverticular disease, by our diet.   If the abdominal pain worsens, nausea, fever--you need to return. You are able to use the suppositories at this time.  After 2 weeks, and/or symptoms are at bay, I want you to include a high fiber diet.   If you continue to have constipation after the fiber diet, you can add over-the-counter miralax.  Clear Liquid Diet A clear liquid diet means that you only have liquids that you can see through. You do not eat any food on this diet. Most people need to follow this diet for only a short time. What do I need to know about this diet?  A clear liquid is a liquid that you can see through when you hold it up to a light.  This diet does not give you all the nutrients that you need. Choose a variety of the liquids that your doctor says you can drink on this diet. That way, you will get as many nutrients as possible.  If you are not sure whether you can have certain items, ask your doctor. What can I have?  Water and flavored water.  Fruit juices that do not have pulp, such as cranberry juice and apple juice.  Tea and coffee without milk or cream.  Clear bouillon or broth.  Broth-based soups that have been strained.  Flavored gelatins.  Honey.  Sugar water.  Frozen ice or frozen ice pops that do not have any milk, yogurt, fruit pieces, or fruit pulp in them.  Clear sodas.  Clear sports drinks. The items listed above may not be a complete list of recommended liquids. Contact your food and nutrition expert (dietitian) for more options.  What can I not have?  Juices that have pulp.  Milk.  Cream or cream-based soups.  Yogurt. The items listed above may not be a complete list of liquids to avoid. Contact your food and  nutrition expert for more information.  Summary  A clear liquid diet is a diet that includes only liquids that you can see through.  The goal of this diet is to help you recover.  Make sure to avoid liquids with milk, cream, or pulp while you are on this diet. This information is not intended to replace advice given to you by your health care provider. Make sure you discuss any questions you have with your health care provider. Document Released: 01/17/2008 Document Revised: 09/17/2015 Document Reviewed: 12/31/2012 Elsevier Interactive Patient Education  2017 Elsevier Inc.   High-Fiber Diet Fiber, also called dietary fiber, is a type of carbohydrate found in fruits, vegetables, whole grains, and beans. A high-fiber diet can have many health benefits. Your health care provider may recommend a high-fiber diet to help:  Prevent constipation. Fiber can make your bowel movements more regular.  Lower your cholesterol.  Relieve hemorrhoids, uncomplicated diverticulosis, or irritable bowel syndrome.  Prevent overeating as part of a weight-loss plan.  Prevent heart disease, type 2 diabetes, and certain cancers. What is my plan? The recommended daily intake of fiber includes:  38 grams for men under age 36.  30 grams for men over age 36.  25 grams for women under age 36.  21 grams for women over age 36. You can get  the recommended daily intake of dietary fiber by eating a variety of fruits, vegetables, grains, and beans. Your health care provider may also recommend a fiber supplement if it is not possible to get enough fiber through your diet. What do I need to know about a high-fiber diet?  Fiber supplements have not been widely studied for their effectiveness, so it is better to get fiber through food sources.  Always check the fiber content on thenutrition facts label of any prepackaged food. Look for foods that contain at least 5 grams of fiber per serving.  Ask your dietitian  if you have questions about specific foods that are related to your condition, especially if those foods are not listed in the following section.  Increase your daily fiber consumption gradually. Increasing your intake of dietary fiber too quickly may cause bloating, cramping, or gas.  Drink plenty of water. Water helps you to digest fiber. What foods can I eat? Grains  Whole-grain breads. Multigrain cereal. Oats and oatmeal. Brown rice. Barley. Bulgur wheat. Millet. Bran muffins. Popcorn. Rye wafer crackers. Vegetables  Sweet potatoes. Spinach. Kale. Artichokes. Cabbage. Broccoli. Green peas. Carrots. Squash. Fruits  Berries. Pears. Apples. Oranges. Avocados. Prunes and raisins. Dried figs. Meats and Other Protein Sources  Navy, kidney, pinto, and soy beans. Split peas. Lentils. Nuts and seeds. Dairy  Fiber-fortified yogurt. Beverages  Fiber-fortified soy milk. Fiber-fortified orange juice. Other  Fiber bars. The items listed above may not be a complete list of recommended foods or beverages. Contact your dietitian for more options.  What foods are not recommended? Grains  White bread. Pasta made with refined flour. White rice. Vegetables  Fried potatoes. Canned vegetables. Well-cooked vegetables. Fruits  Fruit juice. Cooked, strained fruit. Meats and Other Protein Sources  Fatty cuts of meat. Fried Environmental education officer or fried fish. Dairy  Milk. Yogurt. Cream cheese. Sour cream. Beverages  Soft drinks. Other  Cakes and pastries. Butter and oils. The items listed above may not be a complete list of foods and beverages to avoid. Contact your dietitian for more information.  What are some tips for including high-fiber foods in my diet?  Eat a wide variety of high-fiber foods.  Make sure that half of all grains consumed each day are whole grains.  Replace breads and cereals made from refined flour or white flour with whole-grain breads and cereals.  Replace white rice with brown rice,  bulgur wheat, or millet.  Start the day with a breakfast that is high in fiber, such as a cereal that contains at least 5 grams of fiber per serving.  Use beans in place of meat in soups, salads, or pasta.  Eat high-fiber snacks, such as berries, raw vegetables, nuts, or popcorn. This information is not intended to replace advice given to you by your health care provider. Make sure you discuss any questions you have with your health care provider. Document Released: 02/03/2005 Document Revised: 07/12/2015 Document Reviewed: 07/19/2013 Elsevier Interactive Patient Education  2017 [employer].   IF you received an x-ray today, you will receive an invoice from [facility]. Please contact [facility] at [phone] with questions or concerns regarding your invoice.   IF you received labwork today, you will receive an invoice from [place]. Please contact [facility] at [phone] with questions or concerns regarding your invoice.   Our billing staff will not be able to assist you with questions regarding bills from these companies.  You will be contacted with the lab results as soon as they are  available. The fastest way to get your results is to activate your My Chart account. Instructions are located on the last page of this paperwork. If you have not heard from [place] regarding the results in 2 weeks, please contact this office.

## 2016-06-14 ENCOUNTER — Encounter: Payer: Self-pay | Admitting: Physician Assistant

## 2016-06-14 ENCOUNTER — Ambulatory Visit (INDEPENDENT_AMBULATORY_CARE_PROVIDER_SITE_OTHER): Payer: BLUE CROSS/BLUE SHIELD | Admitting: Physician Assistant

## 2016-06-14 VITALS — BP 144/93 | HR 86 | Temp 99.2°F | Resp 17 | Ht 62.5 in | Wt 163.0 lb

## 2016-06-14 DIAGNOSIS — R0981 Nasal congestion: Secondary | ICD-10-CM

## 2016-06-14 DIAGNOSIS — R05 Cough: Secondary | ICD-10-CM | POA: Diagnosis not present

## 2016-06-14 DIAGNOSIS — R059 Cough, unspecified: Secondary | ICD-10-CM

## 2016-06-14 DIAGNOSIS — R0982 Postnasal drip: Secondary | ICD-10-CM

## 2016-06-14 MED ORDER — FLUTICASONE PROPIONATE 50 MCG/ACT NA SUSP: 2.0000 | Freq: Every day | NASAL | 6 refills | Status: DC

## 2016-06-14 MED ORDER — PSEUDOEPHEDRINE-GUAIFENESIN ER 60-600 MG PO TB12: 1.0000 | ORAL_TABLET | Freq: Three times a day (TID) | ORAL | 0 refills | Status: DC

## 2016-06-14 MED ORDER — GUAIFENESIN ER 600 MG PO TB12: 600.0000 mg | ORAL_TABLET | Freq: Two times a day (BID) | ORAL | 0 refills | Status: AC

## 2016-06-14 NOTE — Progress Notes (Signed)
Tammy Byrd  MRN: [id number] DOB: 1981/01/24  PCP: No PCP Per Patient  Subjective:  Pt is a 36 year old female PMH sickle cell trait who presents to clinic for problem with her throat x 1 week  Endorses lots of mucus recently - runny nose "clear drainage. Feels like it is stuck in her throat. Tried to cough and "hack out the mucus".  Endorses some cough. Cough is productive.  Denies h/o seasonal allergies.   She has tried tylenol and Vick's rub - not helping.  Denies sore throat, pain in throat, pain with swallowing, difficulty breathing, difficulty swallowing, shob, fever, chills, nausea, vomiting, sneezing, watery eyes.  She is currently breast feeding her 56 month old baby.   Review of Systems  Constitutional: Negative for chills, diaphoresis and fever.  HENT: Positive for congestion, postnasal drip and rhinorrhea. Negative for sinus pressure, sneezing and sore throat.   Respiratory: Positive for cough. Negative for chest tightness, shortness of breath and wheezing.   Gastrointestinal: Negative for abdominal pain, nausea and vomiting.  Allergic/Immunologic: Negative for environmental allergies.    Patient Active Problem List   Diagnosis Date Noted  . History of gestational diabetes 10/16/2015  . Status post primary low transverse cesarean section 09/05/2015  . History of preterm delivery 09/04/2015  . History of severe pre-eclampsia 08/16/2015  . 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 Sickle cell trait (HCC) 04/14/2015    Current Outpatient Prescriptions on File Prior to Visit  Medication Sig Dispense Refill  . acetaminophen (TYLENOL) 325 MG tablet Take 2 tablets (650 mg total) by mouth every 4 (four) hours as needed (for pain scale < 4  OR  temperature  >/=  100.5 F). (Patient not taking: Reported on 06/14/2016) 30 tablet 1  . hydrocortisone (ANUSOL-HC) 25 MG suppository Place 1 suppository (25 mg total) rectally  2 (two) times daily. (Patient not taking: Reported on 06/14/2016) 12 suppository 0  . oxyCODONE (OXY IR/ROXICODONE) 5 MG immediate release tablet Take 1 tablet (5 mg total) by mouth every 4 (four) hours as needed (pain scale 4-7). (Patient not taking: Reported on 12/29/2015) 20 tablet 0   No current facility-administered medications on file prior to visit.     No Known Allergies   Objective:  BP (!) 144/93   Pulse 86   Temp 99.2 F (37.3 C) (Oral)   Resp 17   Ht 5' 2.5" (1.588 m)   Wt 163 lb (73.9 kg)   SpO2 98%   BMI 29.34 kg/m   Physical Exam  Constitutional: She is oriented to person, place, and time and well-developed, well-nourished, and in no distress. No distress.  HENT:  Right Ear: Tympanic membrane normal.  Left Ear: Tympanic membrane normal.  Nose: Mucosal edema present. No rhinorrhea. Right sinus exhibits no maxillary sinus tenderness and no frontal sinus tenderness. Left sinus exhibits no maxillary sinus tenderness and no frontal sinus tenderness.  Mouth/Throat: Oropharynx is clear and moist and mucous membranes are normal.  Neck: Normal range of motion and full passive range of motion without pain. Neck supple. No muscular tenderness present. No neck rigidity.  Cardiovascular: Normal rate, regular rhythm and normal heart sounds.   Neurological: She is alert and oriented to person, place, and time. GCS score is 15.  Skin: Skin is warm and dry.  Psychiatric: Mood, memory, affect and judgment normal.  Vitals reviewed.   Assessment and Plan :  1. Post-nasal drip 2.  Nasal congestion 3. Cough - fluticasone (FLONASE) 50 MCG/ACT nasal spray; Place 2 sprays into both nostrils daily.  Dispense: 16 g; Refill: 6 - guaiFENesin (MUCINEX) 600 MG 12 hr tablet; Take 1 tablet (600 mg total) by mouth 2 (two) times daily. Take with a full glass of water  Dispense: 10 tablet; Refill: 0 - No concerning findings on PE or HPI for dysphagia. Suspect PND due to seasonal allergies. Pt  denies h/o seasonal allergies. Advised her to try OTC loratadine. Encouraged pt to push fluids. RTC in 5-7 days if no improvement.   Marco Collie, PA-C  Primary Care at [facility] 06/14/2016 9:31 AM

## 2016-06-14 NOTE — Patient Instructions (Addendum)
Please stay well hydrated - drink 2-3 liters of water daily. Warm tea with honey and lemon will help soothe your throat.  Come back if you are not better in 7-10 days.  You may take over the counter Loratadine (allergy medication) as this may be due to seasonal allergies.   Thank you for coming in today. I hope you feel we met your needs.  Feel free to call [facility] if you have any questions or further requests.  Please consider signing up for MyChart if you do not already have it, as this is a great way to communicate with me.  Best,  Whitney McVey, PA-C   IF you received an x-ray today, you will receive an invoice from [facility]. Please contact [facility] at [phone] with questions or concerns regarding your invoice.   IF you received labwork today, you will receive an invoice from [facility]. Please contact [facility] at [phone] with questions or concerns regarding your invoice.   Our billing staff will not be able to assist you with questions regarding bills from these companies.  You will be contacted with the lab results as soon as they are available. The fastest way to get your results is to activate your My Chart account. Instructions are located on the last page of this paperwork. If you have not heard from [place] regarding the results in 2 weeks, please contact this office.

## 2016-08-27 ENCOUNTER — Encounter: Payer: Self-pay | Admitting: Physician Assistant

## 2016-08-27 ENCOUNTER — Ambulatory Visit (INDEPENDENT_AMBULATORY_CARE_PROVIDER_SITE_OTHER): Payer: BLUE CROSS/BLUE SHIELD | Admitting: Physician Assistant

## 2016-08-27 ENCOUNTER — Ambulatory Visit: Payer: BLUE CROSS/BLUE SHIELD | Admitting: Family Medicine

## 2016-08-27 VITALS — BP 142/90 | HR 95 | Temp 98.6°F | Resp 16 | Ht 62.5 in | Wt 153.6 lb

## 2016-08-27 DIAGNOSIS — N390 Urinary tract infection, site not specified: Secondary | ICD-10-CM

## 2016-08-27 DIAGNOSIS — R350 Frequency of micturition: Secondary | ICD-10-CM

## 2016-08-27 DIAGNOSIS — R6883 Chills (without fever): Secondary | ICD-10-CM | POA: Diagnosis not present

## 2016-08-27 LAB — POCT URINALYSIS DIP (MANUAL ENTRY)
BILIRUBIN UA: NEGATIVE mg/dL
Bilirubin, UA: NEGATIVE
Glucose, UA: NEGATIVE mg/dL
Nitrite, UA: NEGATIVE
PH UA: 7 (ref 5.0–8.0)
PROTEIN UA: NEGATIVE mg/dL
RBC UA: NEGATIVE
SPEC GRAV UA: 1.02 (ref 1.010–1.025)
UROBILINOGEN UA: 0.2 U/dL

## 2016-08-27 LAB — POCT CBC
Granulocyte percent: 54 %G (ref 37–80)
HCT, POC: 38 % (ref 37.7–47.9)
Hemoglobin: 12.2 g/dL (ref 12.2–16.2)
Lymph, poc: 2.2 (ref 0.6–3.4)
MCH: 21.9 pg — AB (ref 27–31.2)
MCHC: 32.1 g/dL (ref 31.8–35.4)
MCV: 68 fL — AB (ref 80–97)
MID (CBC): 0.4 (ref 0–0.9)
MPV: 10.8 fL (ref 0–99.8)
PLATELET COUNT, POC: 288 10*3/uL (ref 142–424)
POC Granulocyte: 3 (ref 2–6.9)
POC LYMPH %: 39.6 % (ref 10–50)
POC MID %: 6.4 % (ref 0–12)
RBC: 5.59 M/uL — AB (ref 4.04–5.48)
RDW, POC: 13.4 %
WBC: 5.5 10*3/uL (ref 4.6–10.2)

## 2016-08-27 LAB — POC MICROSCOPIC URINALYSIS (UMFC): Mucus: ABSENT

## 2016-08-27 LAB — GLUCOSE, POCT (MANUAL RESULT ENTRY): POC GLUCOSE: 109 mg/dL — AB (ref 70–99)

## 2016-08-27 MED ORDER — CEPHALEXIN 500 MG PO CAPS: 500.0000 mg | ORAL_CAPSULE | Freq: Two times a day (BID) | ORAL | 0 refills | Status: AC

## 2016-08-27 NOTE — Patient Instructions (Addendum)
Your results indicate you have a UTI. I have given you a prescription for an antibiotic.  Please take with food. This antibiotic is safe to use during breastfeeding, but may cause some diarrhea for your infant. I have sent off a urine culture and we should have those results in 48 hours. If your symptoms worsen while you are awaiting these results or you develop fever, chills, flank pian, nausea and vomiting, please seek care immediately.      Urinary Tract Infection, Adult A urinary tract infection (UTI) is an infection of any part of the urinary tract. The urinary tract includes the:  Kidneys.  Ureters.  Bladder.  Urethra.  These organs make, store, and get rid of pee (urine) in the body. Follow these instructions at home:  Take over-the-counter and prescription medicines only as told by your doctor.  If you were prescribed an antibiotic medicine, take it as told by your doctor. Do not stop taking the antibiotic even if you start to feel better.  Avoid the following drinks: ? Alcohol. ? Caffeine. ? Tea. ? Carbonated drinks.  Drink enough fluid to keep your pee clear or pale yellow.  Keep all follow-up visits as told by your doctor. This is important.  Make sure to: ? Empty your bladder often and completely. Do not to hold pee for long periods of time. ? Empty your bladder before and after sex. ? Wipe from front to back after a bowel movement if you are female. Use each tissue one time when you wipe. Contact a doctor if:  You have back pain.  You have a fever.  You feel sick to your stomach (nauseous).  You throw up (vomit).  Your symptoms do not get better after 3 days.  Your symptoms go away and then come back. Get help right away if:  You have very bad back pain.  You have very bad lower belly (abdominal) pain.  You are throwing up and cannot keep down any medicines or water. This information is not intended to replace advice given to you by your health  care provider. Make sure you discuss any questions you have with your health care provider. Document Released: 07/23/2007 Document Revised: 07/12/2015 Document Reviewed: 12/25/2014 Elsevier Interactive Patient Education  2018 [employer]     IF you received an x-ray today, you will receive an invoice from [facility]. Please contact [facility] at [phone] with questions or concerns regarding your invoice.   IF you received labwork today, you will receive an invoice from [facility]. Please contact [facility] at [phone] with questions or concerns regarding your invoice.   Our billing staff will not be able to assist you with questions regarding bills from these companies.  You will be contacted with the lab results as soon as they are available. The fastest way to get your results is to activate your My Chart account. Instructions are located on the last page of this paperwork. If you have not heard from us regarding the results in 2 weeks, please contact this office.

## 2016-08-27 NOTE — Progress Notes (Signed)
Tammy Byrd  MRN: [id number] DOB: 09/10/1980  Subjective:  Tammy Byrd is a 36 y.o. female seen in office today for a chief complaint of chills and fatigue x 1 week. Has associated urinary frequency and suprapubic pain. Has also had a few episodes of lightheadedness. Denies fever, sore throat, syncope, nausea, and vomiting. Has not been around any sick contacts. Denies alcohol or drug use. Has tired some asian remedy to release heat which did help. LMP > 1 year ago. Has nexplanon placed. No recent travel or new food exposure.  Has been eating normally. Drinking about 2 bottles of water a day. Last UTI was six months ago. Of note, patient is currently breastfeeding.   Review of Systems  Constitutional: Negative for appetite change.  HENT: Positive for rhinorrhea and sneezing. Negative for congestion, ear pain, sinus pain and sinus pressure.   Eyes: Negative for visual disturbance.  Respiratory: Positive for cough (mild cough this am, nonproductive). Negative for shortness of breath and wheezing.   Cardiovascular: Negative for chest pain and leg swelling.  Genitourinary: Negative for dysuria, hematuria and urgency.  Musculoskeletal: Positive for myalgias. Negative for neck pain and neck stiffness.  Skin: Negative for rash.  Neurological: Negative for tremors, syncope, facial asymmetry, speech difficulty, numbness and headaches.    Patient Active Problem List   Diagnosis Date Noted  . History of gestational diabetes 10/16/2015  . Status post primary low transverse cesarean section 09/05/2015  . History of preterm delivery 09/04/2015  . History of severe pre-eclampsia 08/16/2015  . 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 Sickle cell trait (HCC) 04/14/2015    Current Outpatient Prescriptions on File Prior to Visit  Medication Sig Dispense Refill  . fluticasone (FLONASE) 50 MCG/ACT nasal spray Place 2 sprays into both nostrils  daily. (Patient not taking: Reported on 08/27/2016) 16 g 6   No current facility-administered medications on file prior to visit.     No Known Allergies   Objective:  BP (!) 142/90   Pulse 95   Temp 98.6 F (37 C) (Oral)   Resp 16   Ht 5' 2.5" (1.588 m)   Wt 153 lb 9.6 oz (69.7 kg)   SpO2 98%   BMI 27.65 kg/m   Physical Exam  Constitutional: She is oriented to person, place, and time and well-developed, well-nourished, and in no distress.  HENT:  Head: Normocephalic and atraumatic.  Right Ear: Tympanic membrane, external ear and ear canal normal.  Left Ear: Tympanic membrane, external ear and ear canal normal.  Nose: Mucosal edema (mild) present. Right sinus exhibits no maxillary sinus tenderness and no frontal sinus tenderness. Left sinus exhibits no maxillary sinus tenderness and no frontal sinus tenderness.  Mouth/Throat: Uvula is midline, oropharynx is clear and moist and mucous membranes are normal.  Eyes: Conjunctivae are normal.  Neck: Normal range of motion.  Cardiovascular: Normal rate, regular rhythm, normal heart sounds and intact distal pulses.   Pulmonary/Chest: Effort normal and breath sounds normal.  Abdominal: Soft. Normal appearance and bowel sounds are normal. She exhibits no distension. There is tenderness (milld) in the suprapubic area. There is no rigidity, no guarding, no CVA tenderness, no tenderness at McBurney's point and negative Murphy's sign.  Lymphadenopathy:       Head (right side): No submental, no submandibular, no tonsillar, no preauricular, no posterior auricular and no occipital adenopathy present.       Head (left side): No submental,  no submandibular, no tonsillar, no preauricular, no posterior auricular and no occipital adenopathy present.    She has no cervical adenopathy.       Right: No supraclavicular adenopathy present.       Left: No supraclavicular adenopathy present.  Neurological: She is alert and oriented to person, place, and time.  Gait normal.  Skin: Skin is warm. She is diaphoretic (mild).  Psychiatric: Affect normal.  Vitals reviewed.  Results for orders placed or performed in visit on 08/27/16 (from the past 24 hour(s))  POCT CBC     Status: Abnormal   Collection Time: 08/27/16  9:08 AM  Result Value Ref Range   WBC 5.5 4.6 - 10.2 K/uL   Lymph, poc 2.2 0.6 - 3.4   POC LYMPH PERCENT 39.6 10 - 50 %L   MID (cbc) 0.4 0 - 0.9   POC MID % 6.4 0 - 12 %M   POC Granulocyte 3.0 2 - 6.9   Granulocyte percent 54.0 37 - 80 %G   RBC 5.59 (A) 4.04 - 5.48 M/uL   Hemoglobin 12.2 12.2 - 16.2 g/dL   HCT, POC 16.1 09.6 - 47.9 %   MCV 68.0 (A) 80 - 97 fL   MCH, POC 21.9 (A) 27 - 31.2 pg   MCHC 32.1 31.8 - 35.4 g/dL   RDW, POC 04.5 %   Platelet Count, POC 288 142 - 424 K/uL   MPV 10.8 0 - 99.8 fL  POCT glucose (manual entry)     Status: Abnormal   Collection Time: 08/27/16  9:16 AM  Result Value Ref Range   POC Glucose 109 (A) 70 - 99 mg/dl  POCT urinalysis dipstick     Status: Abnormal   Collection Time: 08/27/16  9:52 AM  Result Value Ref Range   Color, UA yellow yellow   Clarity, UA clear clear   Glucose, UA negative negative mg/dL   Bilirubin, UA negative negative   Ketones, POC UA negative negative mg/dL   Spec Grav, UA 4.098 1.191 - 1.025   Blood, UA negative negative   pH, UA 7.0 5.0 - 8.0   Protein Ur, POC negative negative mg/dL   Urobilinogen, UA 0.2 0.2 or 1.0 E.U./dL   Nitrite, UA Negative Negative   Leukocytes, UA Trace (A) Negative  POCT Microscopic Urinalysis ([facility])     Status: Abnormal   Collection Time: 08/27/16  9:58 AM  Result Value Ref Range   WBC,UR,HPF,POC Moderate (A) None WBC/hpf   RBC,UR,HPF,POC None None RBC/hpf   Bacteria Few (A) None, Too numerous to count   Mucus Absent Absent   Epithelial Cells, UR Per Microscopy Moderate (A) None, Too numerous to count cells/hpf     Assessment and Plan :  1. Chills - POCT glucose (manual entry) - TSH 2. Urinary frequency - POCT CBC -  POCT Microscopic Urinalysis ([facility]) - POCT urinalysis dipstick - Urine Culture 3. Urinary tract infection without hematuria, site unspecified Urine culture pending. Pt instructed to return if symptoms worsen or do not improve with treatment.  - cephALEXin (KEFLEX) 500 MG capsule; Take 1 capsule (500 mg total) by mouth 2 (two) times daily.  Dispense: 14 capsule; Refill: 0   Benjiman Core PA-C  [facility] 08/27/2016 12:49 PM

## 2016-08-28 LAB — TSH

## 2016-08-31 LAB — URINE CULTURE

## 2016-09-02 ENCOUNTER — Other Ambulatory Visit: Payer: Self-pay | Admitting: Physician Assistant

## 2016-09-02 DIAGNOSIS — R7989 Other specified abnormal findings of blood chemistry: Secondary | ICD-10-CM

## 2016-09-10 ENCOUNTER — Encounter: Payer: Self-pay | Admitting: Physician Assistant

## 2016-09-10 ENCOUNTER — Ambulatory Visit (INDEPENDENT_AMBULATORY_CARE_PROVIDER_SITE_OTHER): Payer: BLUE CROSS/BLUE SHIELD | Admitting: Physician Assistant

## 2016-09-10 VITALS — BP 135/81 | HR 99 | Temp 98.3°F | Resp 18 | Ht 63.15 in | Wt 150.8 lb

## 2016-09-10 DIAGNOSIS — R946 Abnormal results of thyroid function studies: Secondary | ICD-10-CM

## 2016-09-10 DIAGNOSIS — R7989 Other specified abnormal findings of blood chemistry: Secondary | ICD-10-CM

## 2016-09-10 NOTE — Patient Instructions (Addendum)
I have rechecked your TSH today and drawn additional thyroid labs. I should have these results within the week. I have also placed a referral to endocrinology and they will contact you within 1-2 weeks with an appointment. It is important to follow up with them because they will need to furhter evaluate your thyroid in their office and potentially start you on medication. Please let me know if you have any questions or any of your symptoms worsen. Thank you for letting me participate in your health and well being.     IF you received an x-ray today, you will receive an invoice from [facility]. Please contact [facility] at [phone] with questions or concerns regarding your invoice.   IF you received labwork today, you will receive an invoice from [facility]. Please contact [facility] at [phone] with questions or concerns regarding your invoice.   Our billing staff will not be able to assist you with questions regarding bills from these companies.  You will be contacted with the lab results as soon as they are available. The fastest way to get your results is to activate your My Chart account. Instructions are located on the last page of this paperwork. If you have not heard from us regarding the results in 2 weeks, please contact this office.

## 2016-09-10 NOTE — Progress Notes (Signed)
    MRN: [id number] DOB: 01/24/1981  Subjective:   Tammy Byrd is a 36 y.o. female presenting for follow up on low TSH. Pt originally seen for UTI on 08/27/16. TSH from that visit was <0.006. She was instructed to return to office for repeat labs. Today, pt endorses that for the past few weeks she has felt some increased heart rate when walking around, fatigue, sweating, and some weight loss despite eating normally. Denies heat intolerance, visual problems, hyperactivity, irritability, restlessness, nervousness, insomnia, diarrhea, and skin changes.  Denies smoking, alcohol use, and illicit drug use. Has no personal hx or FH of thyroid disorders that she is aware of. Of note, pt is currently breastfeeding.   ROS  Per HPI [place] has a current medication list which includes the following prescription(s): fluticasone. Also has No Known Allergies.  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 Also  has a past surgical history that includes No past surgeries and Cesarean section (N/A, 09/04/2015).   Objective:   Vitals: BP 135/81 (BP Location: Right Arm, Patient Position: Sitting, Cuff Size: Normal)   Pulse 99   Temp 98.3 F (36.8 C) (Oral)   Resp 18   Ht 5' 3.15" (1.604 m)   Wt 150 lb 12.8 oz (68.4 kg)   SpO2 98%   Breastfeeding? Yes   BMI 26.59 kg/m   Physical Exam  Constitutional: She is oriented to person, place, and time. She appears well-developed and well-nourished.  HENT:  Head: Normocephalic and atraumatic.  Eyes: Pupils are equal, round, and reactive to light. Conjunctivae are normal.  Neck: Normal range of motion. No thyroid mass and no thyromegaly present.  Cardiovascular: Normal rate, regular rhythm, normal heart sounds and intact distal pulses.   Pulmonary/Chest: Effort normal and breath sounds normal.  Neurological: She is alert and oriented to person, place, and time.    Reflex Scores:      Tricep reflexes are 2+ on the right side and 2+ on the left side.      Bicep reflexes are 2+ on the right side and 2+ on the left side.      Brachioradialis reflexes are 2+ on the right side and 2+ on the left side.      Patellar reflexes are 2+ on the right side and 2+ on the left side.      Achilles reflexes are 2+ on the right side and 2+ on the left side. Skin: Skin is warm and dry.  Psychiatric: She has a normal mood and affect.  Vitals reviewed.   No results found for this or any previous visit (from the past 24 hour(s)).  Assessment and Plan :  1. Low TSH level Labs pending. Will refer to endocrinology for further evaluation and management. Return as needed.  - Ambulatory referral to Endocrinology - T3, Free - T4, Free - TSH  Benjiman CoreBrittany Juliana Boling, PA-C  Primary Care at [facility] 09/14/2016 12:09 AM

## 2016-09-11 LAB — T4, FREE: Free T4: 4.5 ng/dL — ABNORMAL HIGH (ref 0.82–1.77)

## 2016-09-11 LAB — T3, FREE: T3, Free: 18 pg/mL — ABNORMAL HIGH (ref 2.0–4.4)

## 2016-09-11 LAB — TSH: TSH: <0.006 u[IU]/mL — ABNORMAL LOW (ref 0.450–4.500)

## 2016-11-19 ENCOUNTER — Encounter: Payer: Self-pay | Admitting: Internal Medicine

## 2016-11-19 ENCOUNTER — Ambulatory Visit (INDEPENDENT_AMBULATORY_CARE_PROVIDER_SITE_OTHER): Payer: BLUE CROSS/BLUE SHIELD | Admitting: Internal Medicine

## 2016-11-19 VITALS — BP 148/106 | HR 104 | Temp 98.2°F | Ht 62.75 in | Wt 143.0 lb

## 2016-11-19 DIAGNOSIS — E0590 Thyrotoxicosis, unspecified without thyrotoxic crisis or storm: Secondary | ICD-10-CM

## 2016-11-19 LAB — T4, FREE: Free T4: 3.74 ng/dL — ABNORMAL HIGH (ref 0.60–1.60)

## 2016-11-19 LAB — T3, FREE: T3, Free: 11.5 pg/mL — ABNORMAL HIGH (ref 2.3–4.2)

## 2016-11-19 LAB — TSH: TSH: <0.01 u[IU]/mL — ABNORMAL LOW (ref 0.35–4.50)

## 2016-11-19 MED ORDER — ATENOLOL 25 MG PO TABS: 25.0000 mg | ORAL_TABLET | Freq: Two times a day (BID) | ORAL | 5 refills | Status: DC

## 2016-11-19 NOTE — Patient Instructions (Addendum)
Please stop at the lab.  Please stop the Methimazole (Tapazole) and call us or your primary care doctor if you develop: - sore throat - fever - yellow skin - dark urine - light colored stools As we will then need to check your blood counts and liver tests.  Please start Atenolol 25 mg daily and may need to increase to 2x a day.  Take both Methimazole and Atenolol right after a breastfeed and repeat breastfeeding after at least 4h.  Please return in 3 months.    Hyperthyroidism Hyperthyroidism is when the thyroid is too active (overactive). Your thyroid is a large gland that is located in your neck. The thyroid helps to control how your body uses food (metabolism). When your thyroid is overactive, it produces too much of a hormone called thyroxine. What are the causes? Causes of hyperthyroidism may include:  Graves disease. This is when your immune system attacks the thyroid gland. This is the most common cause.  Inflammation of the thyroid gland.  Tumor in the thyroid gland or somewhere else.  Excessive use of thyroid medicines, including: ? Prescription thyroid supplement. ? Herbal supplements that mimic thyroid hormones.  Solid or fluid-filled lumps within your thyroid gland (thyroid nodules).  Excessive ingestion of iodine.  What increases the risk?  Being female.  Having a family history of thyroid conditions. What are the signs or symptoms? Signs and symptoms of hyperthyroidism may include:  Nervousness.  Inability to tolerate heat.  Unexplained weight loss.  Diarrhea.  Change in the texture of hair or skin.  Heart skipping beats or making extra beats.  Rapid heart rate.  Loss of menstruation.  Shaky hands.  Fatigue.  Restlessness.  Increased appetite.  Sleep problems.  Enlarged thyroid gland or nodules.  How is this diagnosed? Diagnosis of hyperthyroidism may include:  Medical history and physical exam.  Blood tests.  Ultrasound  tests.  How is this treated? Treatment may include:  Medicines to control your thyroid.  Surgery to remove your thyroid.  Radiation therapy.  Follow these instructions at home:  Take medicines only as directed by your health care provider.  Do not use any tobacco products, including cigarettes, chewing tobacco, or electronic cigarettes. If you need help quitting, ask your health care provider.  Do not exercise or do physical activity until your health care provider approves.  Keep all follow-up appointments as directed by your health care provider. This is important. Contact a health care provider if:  Your symptoms do not get better with treatment.  You have fever.  You are taking thyroid replacement medicine and you: ? Have depression. ? Feel mentally and physically slow. ? Have weight gain. Get help right away if:  You have decreased alertness or a change in your awareness.  You have abdominal pain.  You feel dizzy.  You have a rapid heartbeat.  You have an irregular heartbeat. This information is not intended to replace advice given to you by your health care provider. Make sure you discuss any questions you have with your health care provider. Document Released: 02/03/2005 Document Revised: 07/05/2015 Document Reviewed: 06/21/2013 Elsevier Interactive Patient Education  2017 [employer].

## 2016-11-19 NOTE — Progress Notes (Signed)
Patient ID: Tammy Byrd, female   DOB: Oct 17, 1980, 36 y.o.   MRN: [id number]    HPI  Tammy Byrd is a 36 y.o.-year-old female, referred by her PCP, Magdalene River, PA-C, for evaluation for thyrotoxicosis.  She initially had UTI 2x in 07/2016 >> started to feel poorly after the second one. Pt presented to see PCP in 08/2016 for: palpitations (increased HR), fatigue, sweating. TFTs were in thyrotoxic range.  I reviewed pt's thyroid tests: Lab Results  Component Value Date   TSH <0.006 (L) 09/10/2016   TSH <0.006 (L) 08/27/2016   TSH 1.040 11/22/2009   FREET4 4.50 (H) 09/10/2016    Pt denies feeling nodules in neck, hoarseness, + occasional dysphagia (dry mouth) /no odynophagia, no SOB with lying down; she c/o: - + fatigue - + excessive sweating - no tremors - no anxiety - + palpitations - no hyperdefecation - no weight loss - + hair loss after the pregnancy  Pt does not have a FH of thyroid ds. + FH of thyroid cancer in mother. No h/o radiation tx to head or neck.  No seaweed or kelp, no recent contrast studies. No steroid use. No herbal supplements. No Biotin use.  Pt. also has a history of HTN in pregnancy.   ROS: Constitutional: + see hpi Eyes: no blurry vision, no xerophthalmia ENT: no sore throat, + see hpi Cardiovascular: no CP/SOB/+ palpitations/no leg swelling Respiratory: no cough/SOB Gastrointestinal: no N/V/D/C Musculoskeletal: no muscle/joint aches Skin: no rashes Neurological: no tremors/numbness/tingling/dizziness Psychiatric: no depression/anxiety  Past Medical History:  Diagnosis Date  . 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 GDM (gestational diabetes mellitus)   . History of severe pre-eclampsia    Past Surgical History:  Procedure Laterality Date  . CESAREAN SECTION N/A 09/04/2015   Procedure: CESAREAN SECTION;  Surgeon: Levie Heritage, DO;  Location: [facility] BIRTHING SUITES;  Service: Obstetrics;  Laterality: N/A;  . NO PAST SURGERIES     Social  History   Social History  . Marital status: Single,    Spouse name: N/A  . Number of children: 3   Occupational History  . Dental asstnt   Social History Main Topics  . Smoking status: Never Smoker  . Smokeless tobacco: Never Used  . Alcohol use No  . Drug use: No   Current Outpatient Prescriptions  Medication Sig Dispense Refill  . acetaminophen (TYLENOL) 500 MG tablet Take 500 mg by mouth 3 times/day as needed-between meals & bedtime.     No Known Allergies Family History  Problem Relation Age of Onset  . Kidney disease Mother    PE: BP (!) 148/106   Pulse (!) 104   Temp 98.2 F (36.8 C) (Oral)   Ht 5' 2.75" (1.594 m)   Wt 143 lb (64.9 kg)   SpO2 98%   BMI 25.53 kg/m   Wt Readings from Last 3 Encounters:  11/19/16 143 lb (64.9 kg)  09/10/16 150 lb 12.8 oz (68.4 kg)  08/27/16 153 lb 9.6 oz (69.7 kg)   Constitutional: normal weight, in NAD Eyes: PERRLA, EOMI, no exophthalmos, no lid lag, no stare ENT: moist mucous membranes, + symmetric mild  thyromegaly, no thyroid bruits, no cervical lymphadenopathy Cardiovascular: tachycardia, RR, No MRG Respiratory: CTA B Gastrointestinal: abdomen soft, NT, ND, BS+ Musculoskeletal: no deformities, strength intact in all 4 Skin: moist, warm, no rashes Neurological: no tremor with outstretched hands, DTR normal in all 4  ASSESSMENT: 1. Thyrotoxicosis  PLAN:  1. Patient with a recently  found low TSH, with thyrotoxic sxs: fatigue, weakness, heat intolerance, palpitations. - she does not appear to have exogenous causes for the low TSH.  - We discussed that possible causes of thyrotoxicosis are:  Graves ds   Thyroiditis toxic multinodular goiter/ toxic adenoma (I cannot feel nodules at palpation of her thyroid). - I suggested that we check the TSH, fT3 and fT4 and also add thyroid stimulating antibodies to screen for Graves' disease.  - If the tests remain abnormal, we may need an uptake and scan to differentiate between the  3 above possible etiologies  - we discussed about possible modalities of treatment for the above conditions, to include methimazole use, radioactive iodine ablation or (last resort) surgery. She is breast-feeding her youngest daughter, which is now 25 years old so I advised her that if we need to start methimazole, to take it right after a breast-feed and repeat a breast-feed at least 4 hours after taking the methimazole dose. We did discuss about the fact that the methimazole is crossing in breast milk, however, studies have repeatedly shown no impact on the baby at methimazole dose is lowered in 20 mg daily. However, if we need to start at the higher dose, I will advise her to check the TFTs of her baby 3-4 weeks after she starts the medication. Of note, PTU has a shorter half life and does not cross into breast milk is easily, however, it is more dangerous and not recommended in breast-feeding moms. Patient does want to stop breast-feeding so she plans to taper off the feedings. - we might need to do thyroid ultrasound depending on the results of the uptake and scan (if a cold nodule is present) - will add beta blocker (Atenolol) at this time, since she is tachycardic and hypertensive. I advised her to take the atenolol as advised for methimazole (see above) - RTC in 3 months, but likely sooner for repeat labs  Patient Instructions  Please stop at the lab.  Please stop the Methimazole (Tapazole) and call us or your primary care doctor if you develop: - sore throat - fever - yellow skin - dark urine - light colored stools As we will then need to check your blood counts and liver tests.  Please start Atenolol 25 mg daily and may need to increase to 2x a day.  Take both Methimazole and Atenolol right after a breastfeed and repeat breastfeeding after at least 4h.  Please return in 3 months.   Component     Latest Ref Rng & Units 11/19/2016  TSH     0.35 - 4.50 uIU/mL <0.01 Repeated and  verified X2. (L)  Triiodothyronine,Free,Serum     2.3 - 4.2 pg/mL 11.5 (H)  T4,Free(Direct)     0.60 - 1.60 ng/dL 1.61 (H)  TSI     <096 % baseline 263 (H)   New dx of Graves ds >> will start Methimazole 10 mg in am and 5 mg in pm and have her back for labs in 5 weeks.  Carlus Pavlov, MD PhD [facility]

## 2016-11-23 LAB — THYROID STIMULATING IMMUNOGLOBULIN: TSI: 263 % baseline — ABNORMAL HIGH (ref <140)

## 2016-11-25 DIAGNOSIS — E0500 Thyrotoxicosis with diffuse goiter without thyrotoxic crisis or storm: Secondary | ICD-10-CM | POA: Insufficient documentation

## 2016-11-25 MED ORDER — METHIMAZOLE 5 MG PO TABS: ORAL_TABLET | ORAL | 2 refills | Status: DC

## 2017-02-20 ENCOUNTER — Ambulatory Visit: Payer: BLUE CROSS/BLUE SHIELD | Admitting: Internal Medicine

## 2017-04-24 ENCOUNTER — Ambulatory Visit (INDEPENDENT_AMBULATORY_CARE_PROVIDER_SITE_OTHER): Payer: Self-pay | Admitting: Internal Medicine

## 2017-04-24 ENCOUNTER — Encounter: Payer: Self-pay | Admitting: Internal Medicine

## 2017-04-24 VITALS — BP 132/86 | HR 73 | Ht 62.75 in | Wt 150.0 lb

## 2017-04-24 DIAGNOSIS — E0500 Thyrotoxicosis with diffuse goiter without thyrotoxic crisis or storm: Secondary | ICD-10-CM

## 2017-04-24 DIAGNOSIS — E0590 Thyrotoxicosis, unspecified without thyrotoxic crisis or storm: Secondary | ICD-10-CM

## 2017-04-24 LAB — TSH: TSH: <0.01 u[IU]/mL — ABNORMAL LOW (ref 0.35–4.50)

## 2017-04-24 LAB — T4, FREE: FREE T4: 0.94 ng/dL (ref 0.60–1.60)

## 2017-04-24 LAB — T3, FREE: T3 FREE: 2.9 pg/mL (ref 2.3–4.2)

## 2017-04-24 MED ORDER — ATENOLOL 25 MG PO TABS: 25.0000 mg | ORAL_TABLET | Freq: Every day | ORAL | 5 refills | Status: DC

## 2017-04-24 MED ORDER — METHIMAZOLE 5 MG PO TABS: ORAL_TABLET | ORAL | 2 refills | Status: DC

## 2017-04-24 NOTE — Patient Instructions (Addendum)
Please continue Methimazole 10 mg in am and 5 mg in pm.  Please decrease the Atenolol to 25 mg at bedtime.  Please stop at the lab.  Please come back for a follow-up appointment in 6 months.

## 2017-04-24 NOTE — Progress Notes (Signed)
Patient ID: Tammy Byrd, female   DOB: [birth year], 37 y.o.   MRN: [id number]    HPI  Tammy Byrd is a 37 y.o.-year-old female female, returning for follow-up for Graves' disease, which we diagnosed at last visit, 4 mo ago.  Reviewed and addended history: She had 2 UTIs in 07/2016 and started to feel poorly afterwards.  She saw her PCP in 08/2016 for palpitations, fatigue, increased sweating.  TFTs were in the thyrotoxic range.  She was referred to endocrinology.  We confirmed thyrotoxicosis and also TSI antibodies returned elevated, confirming Graves' disease:  Lab Results  Component Value Date   TSI 263 (H) 11/19/2016   We started methimazole 10 mg in a.m. and 5 mg in p.m. in 11/2016.  At that time, I also advised her to start atenolol 25 mg daily and increase to twice a day. She is doing this now >> dizziness when she takes it.  She did not come back for labs in 1.5 months, as advised...  I reviewed pt's thyroid tests: Lab Results  Component Value Date   TSH <0.01 Repeated and verified X2. (L) 11/19/2016   TSH <0.006 (L) 09/10/2016   TSH <0.006 (L) 08/27/2016   TSH 1.040 11/22/2009   FREET4 3.74 (H) 11/19/2016   FREET4 4.50 (H) 09/10/2016    Pt denies: - feeling nodules in neck - hoarseness - dysphagia - choking - SOB with lying down  Pt does not have a FH of thyroid ds. + FH of thyroid cancer in mother. No h/o radiation tx to head or neck.  No seaweed or kelp. No recent contrast studies. No herbal supplements. No Biotin use. No recent steroids use.   Pt. also has a history of HTN in pregnancy.   ROS: Constitutional: no weight gain/no weight loss, no fatigue, no subjective hyperthermia, no subjective hypothermia Eyes: no blurry vision, no xerophthalmia ENT: no sore throat, + see HPI Cardiovascular: no CP/no SOB/no palpitations/no leg swelling Respiratory: no cough/no SOB/no wheezing Gastrointestinal: no N/no V/no D/no C/no acid reflux Musculoskeletal: no muscle aches/no joint  aches Skin: no rashes, no hair loss Neurological: no tremors/no numbness/no tingling/no dizziness  I reviewed pt's medications, allergies, PMH, social hx, family hx, and changes were documented in the history of present illness. Otherwise, unchanged from my initial visit note.  Past Medical History:  Diagnosis Date  . 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 GDM (gestational diabetes mellitus)   . History of severe pre-eclampsia    Past Surgical History:  Procedure Laterality Date  . CESAREAN SECTION N/A 09/04/2015   Procedure: CESAREAN SECTION;  Surgeon: Levie Heritage, DO;  Location: [facility] BIRTH[facility] SUIT[facility];  Service: Obstetrics;  Laterality: N/A;  . NO PAST SURGERIES     Social History   Social History  . Marital status: Single,    Spouse name: N/A  . Number of children: 3   Occupational History  . Dental asstnt   Social History Main Topics  . Smoking status: Never Smoker  . Smokeless tobacco: Never Used  . Alcohol use No  . Drug use: No   Current Outpatient Prescriptions  Medication Sig Dispense Refill  . acetaminophen (TYLENOL) 500 MG tablet Take 500 mg by mouth 3 times/day as needed-between meals & bedtime.     No Known Allergies Family History  Problem Relation Age of Onset  . Kidney disease Mother    PE: BP 132/86 (BP Location: Right Arm, Patient Position: Sitting, Cuff Size: Normal)   Pulse 73   Ht  5' 2.75" (1.594 m)   Wt 150 lb (68 kg)   SpO2 98%   BMI 26.78 kg/m  Wt Readings from Last 3 Encounters:  04/24/17 150 lb (68 kg)  11/19/16 143 lb (64.9 kg)  09/10/16 150 lb 12.8 oz (68.4 kg)   Constitutional: Normal weight, in NAD Eyes: PERRLA, EOMI, no exophthalmos ENT: moist mucous membranes, + slight symmetric thyromegaly, no cervical lymphadenopathy Cardiovascular: RRR, No MRG Respiratory: CTA B Gastrointestinal: abdomen soft, NT, ND, BS+ Musculoskeletal: no deformities, strength intact in all 4 Skin: moist, warm, no rashes Neurological: no  tremor with outstretched hands, DTR normal in all 4  ASSESSMENT: 1.  Graves' disease  PLAN:  1. Patient with biochemical thyrotoxicosis and clinical thyrotoxic symptoms: Fatigue, heat intolerance, palpitations, weakness.  We diagnosed Graves' disease at last visit on the basis of elevated TSI antibodies.  Therefore, we bypassed the thyroid uptake and scan.  We started methimazole 10 mg in a.m. and 5 mg in p.m. but unfortunately she did not return for labs afterwards.  Since she was tachycardic at last visit, I also suggested to start atenolol 25 mg once a day and increase to twice a day.  Of note, she was breast-feeding at last visit and continues to do so today.  We discussed at last visit, and again today with pt and her husband about the fact that methimazole can cross into breast milk, however, studies have repeatedly shown no impact on the baby at methimazole dose is lower than 20 mg daily.  We also discussed that PTU has a shorter half-life and does not cross into breast milk easily, however, it is more dangerous and not recommended in breast-feeding moms. - She is feeling better after starting methimazole and atenolol, however, she has some dizziness after taking Atenolol >> as her pulse is normal >> will decrease the dose to 25 mg daily - At this visit, we will check her TSH, free T4, free T3 and adjust the methimazole dose accordingly. - We did discuss about the other methods of treatment for Graves' disease: RAI ablation and surgery, but I do not feel these are indicated for now, and she agrees. - RTC in 6 months, but likely sooner for repeat labs  Needs refills MMI and Atenolol.  - time spent with the patient: 20 min, of which >50% was spent in obtaining information about her symptoms, reviewing her previous labs, evaluations, and treatments, counseling her about her condition (please see the discussed topics above), and developing a plan to further investigate it.  Component     Latest  Ref Rng & Units 04/24/2017  TSH     0.35 - 4.50 uIU/mL <0.01 Repeated and verified X2. (L)  Triiodothyronine,Free,Serum     2.3 - 4.2 pg/mL 2.9  T4,Free(Direct)     0.60 - 1.60 ng/dL 4.090.94  TFTs much better. TSH still low, but this lags behind the rest of the tests >> We can continue the same MMI dose and repeat tests in 1.5 mo. We may be able to decrease the dose then. Carlus Pavlovristina Tranisha Tissue, MD PhD [facility]

## 2017-07-10 ENCOUNTER — Ambulatory Visit (INDEPENDENT_AMBULATORY_CARE_PROVIDER_SITE_OTHER): Payer: Self-pay | Admitting: Obstetrics and Gynecology

## 2017-07-10 ENCOUNTER — Encounter: Payer: Self-pay | Admitting: Obstetrics and Gynecology

## 2017-07-10 DIAGNOSIS — Z978 Presence of other specified devices: Secondary | ICD-10-CM

## 2017-07-10 DIAGNOSIS — N921 Excessive and frequent menstruation with irregular cycle: Secondary | ICD-10-CM

## 2017-07-10 DIAGNOSIS — Z975 Presence of (intrauterine) contraceptive device: Secondary | ICD-10-CM

## 2017-07-10 MED ORDER — ESTROGENS CONJUGATED 0.625 MG PO TABS: 0.6250 mg | ORAL_TABLET | Freq: Every day | ORAL | 0 refills | Status: DC

## 2017-07-10 NOTE — Progress Notes (Signed)
Ms Calk presents for consideration of removal of her Implanon. Implanon inserted in 2017. Had been doing well with no bleeding until this past March. She has had some degree of bleeding daily since March. H/O Graves Dz but reports stable and no medication changes  PE AF VSS Lungs clear Heart RRR Abd soft + BS  A/P Break through bleeding with Implanon  Discussed medical treatment options for bleeding reviewed with pt. She desires to try before having Implanon removed. Will start Premarin 0.625 mg qd x 30 days. U/R/B reviewed with pt. She was also provided information on other contraception options. Will follow up with me in 6 weeks, if bleeding continues will remove Implanon at pt's request.

## 2017-07-10 NOTE — Patient Instructions (Signed)
Etonogestrel implant What is this medicine? ETONOGESTREL (et oh noe JES trel) is a contraceptive (birth control) device. It is used to prevent pregnancy. It can be used for up to 3 years. This medicine may be used for other purposes; ask your health care provider or pharmacist if you have questions. COMMON BRAND NAME(S): Implanon, Nexplanon What should I tell my health care provider before I take this medicine? They need to know if you have any of these conditions: -abnormal vaginal bleeding -blood vessel disease or blood clots -cancer of the breast, cervix, or liver -depression -diabetes -gallbladder disease -headaches -heart disease or recent heart attack -high blood pressure -high cholesterol -kidney disease -liver disease -renal disease -seizures -tobacco smoker -an unusual or allergic reaction to etonogestrel, other hormones, anesthetics or antiseptics, medicines, foods, dyes, or preservatives -pregnant or trying to get pregnant -breast-feeding How should I use this medicine? This device is inserted just under the skin on the inner side of your upper arm by a health care professional. Talk to your pediatrician regarding the use of this medicine in children. Special care may be needed. Overdosage: If you think you have taken too much of this medicine contact a poison control center or emergency room at once. NOTE: This medicine is only for you. Do not share this medicine with others. What if I miss a dose? This does not apply. What may interact with this medicine? Do not take this medicine with any of the following medications: -amprenavir -bosentan -fosamprenavir This medicine may also interact with the following medications: -barbiturate medicines for inducing sleep or treating seizures -certain medicines for fungal infections like ketoconazole and itraconazole -grapefruit juice -griseofulvin -medicines to treat seizures like carbamazepine, felbamate, oxcarbazepine,  phenytoin, topiramate -modafinil -phenylbutazone -rifampin -rufinamide -some medicines to treat HIV infection like atazanavir, indinavir, lopinavir, nelfinavir, tipranavir, ritonavir -St. John's wort This list may not describe all possible interactions. Give your health care provider a list of all the medicines, herbs, non-prescription drugs, or dietary supplements you use. Also tell them if you smoke, drink alcohol, or use illegal drugs. Some items may interact with your medicine. What should I watch for while using this medicine? This product does not protect you against HIV infection (AIDS) or other sexually transmitted diseases. You should be able to feel the implant by pressing your fingertips over the skin where it was inserted. Contact your doctor if you cannot feel the implant, and use a non-hormonal birth control method (such as condoms) until your doctor confirms that the implant is in place. If you feel that the implant may have broken or become bent while in your arm, contact your healthcare provider. What side effects may I notice from receiving this medicine? Side effects that you should report to your doctor or health care professional as soon as possible: -allergic reactions like skin rash, itching or hives, swelling of the face, lips, or tongue -breast lumps -changes in emotions or moods -depressed mood -heavy or prolonged menstrual bleeding -pain, irritation, swelling, or bruising at the insertion site -scar at site of insertion -signs of infection at the insertion site such as fever, and skin redness, pain or discharge -signs of pregnancy -signs and symptoms of a blood clot such as breathing problems; changes in vision; chest pain; severe, sudden headache; pain, swelling, warmth in the leg; trouble speaking; sudden numbness or weakness of the face, arm or leg -signs and symptoms of liver injury like dark yellow or brown urine; general ill feeling or flu-like symptoms;  light-colored   stools; loss of appetite; nausea; right upper belly pain; unusually weak or tired; yellowing of the eyes or skin -unusual vaginal bleeding, discharge -signs and symptoms of a stroke like changes in vision; confusion; trouble speaking or understanding; severe headaches; sudden numbness or weakness of the face, arm or leg; trouble walking; dizziness; loss of balance or coordination Side effects that usually do not require medical attention (report to your doctor or health care professional if they continue or are bothersome): -acne -back pain -breast pain -changes in weight -dizziness -general ill feeling or flu-like symptoms -headache -irregular menstrual bleeding -nausea -sore throat -vaginal irritation or inflammation This list may not describe all possible side effects. Call your doctor for medical advice about side effects. You may report side effects to FDA at 1-800-FDA-1088. Where should I keep my medicine? This drug is given in a hospital or clinic and will not be stored at home. NOTE: This sheet is a summary. It may not cover all possible information. If you have questions about this medicine, talk to your doctor, pharmacist, or health care provider.  2018 Elsevier/Gold Standard (2015-08-23 11:19:22)  

## 2017-10-30 ENCOUNTER — Ambulatory Visit (INDEPENDENT_AMBULATORY_CARE_PROVIDER_SITE_OTHER): Payer: No Typology Code available for payment source | Admitting: Internal Medicine

## 2017-10-30 ENCOUNTER — Other Ambulatory Visit: Payer: Self-pay | Admitting: Internal Medicine

## 2017-10-30 ENCOUNTER — Encounter: Payer: Self-pay | Admitting: Internal Medicine

## 2017-10-30 VITALS — BP 140/80 | HR 73 | Ht 62.75 in | Wt 162.8 lb

## 2017-10-30 DIAGNOSIS — E0500 Thyrotoxicosis with diffuse goiter without thyrotoxic crisis or storm: Secondary | ICD-10-CM | POA: Diagnosis not present

## 2017-10-30 LAB — TSH: TSH: 1.82 u[IU]/mL (ref 0.35–4.50)

## 2017-10-30 LAB — T3, FREE: T3, Free: 3 pg/mL (ref 2.3–4.2)

## 2017-10-30 LAB — T4, FREE: Free T4: 0.83 ng/dL (ref 0.60–1.60)

## 2017-10-30 MED ORDER — METHIMAZOLE 5 MG PO TABS: ORAL_TABLET | ORAL | 2 refills | Status: DC

## 2017-10-30 NOTE — Patient Instructions (Signed)
Please continue: - Methimazole 10 mg in am and 5 mg in pm. - Atenolol 25 mg at bedtime.  Please stop at the lab.  Please come back for a follow-up appointment in 6 months.

## 2017-10-30 NOTE — Progress Notes (Signed)
Patient ID: Tammy Byrd, female   DOB: 03/30/1980, 37 y.o.   MRN: [id number]    HPI  Tammy Byrd is a 37 y.o.-year-old female, returning for follow-up for Graves' disease.  Last visit 6 months ago.  Reviewed and addended history: She had 2 UTIs in 07/2016 and started to feel poorly afterwards.  She saw her PCP in 08/2016 for palpitations, fatigue, increased sweating.  TFTs were in the thyrotoxic range.  She was referred to endocrinology.  We confirmed thyrotoxicosis and also her TSI antibodies returned elevated, confirming Graves' disease:  Lab Results  Component Value Date   TSI 263 (H) 11/19/2016   We initially started methimazole 10 mg in a.m. and 5 mg in p.m. in 11/2016.  We also started atenolol, dose decreased to 25 mg daily at last visit due to some dizziness.  At last visit, TSH was still suppressed with normal free hormones.  I advised her to return for repeat labs in 1.5 months but she again, did not return for these.  Reviewed patient's TFTs: Lab Results  Component Value Date   TSH <0.01 Repeated and verified X2. (L) 04/24/2017   TSH <0.01 Repeated and verified X2. (L) 11/19/2016   TSH <0.006 (L) 09/10/2016   TSH <0.006 (L) 08/27/2016   TSH 1.040 11/22/2009   FREET4 0.94 04/24/2017   FREET4 3.74 (H) 11/19/2016   FREET4 4.50 (H) 09/10/2016    Pt denies: - feeling nodules in neck - hoarseness - dysphagia - choking - SOB with lying down  Pt does not have a FH of thyroid ds.  Except + FH of thyroid cancer in mother.  No h/o radiation tx to head or neck.  No seaweed or kelp. No recent contrast studies. No herbal supplements. No Biotin use. No recent steroids use.   She has a history of hypertension in pregnancy  ROS: Constitutional: no weight gain/no weight loss, no fatigue, no subjective hyperthermia, no subjective hypothermia Eyes: no blurry vision, no xerophthalmia ENT: no sore throat, + see HPI Cardiovascular: no CP/no SOB/no palpitations/no leg swelling Respiratory:  no cough/no SOB/no wheezing Gastrointestinal: no N/no V/no D/no C/no acid reflux Musculoskeletal: no muscle aches/no joint aches Skin: no rashes, no hair loss Neurological: no tremors/no numbness/no tingling/no dizziness  I reviewed pt's medications, allergies, PMH, social hx, family hx, and changes were documented in the history of present illness. Otherwise, unchanged from my initial visit note.  Past Medical History:  Diagnosis Date  . 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 GDM (gestational diabetes mellitus)   . History of severe pre-eclampsia    Past Surgical History:  Procedure Laterality Date  . CESAREAN SECTION N/A 09/04/2015   Procedure: CESAREAN SECTION;  Surgeon: Levie HeritageJacob J Stinson, DO;  [facility];  Service: Obstetrics;  Laterality: N/A;  . NO PAST SURGERIES     Social History   Social History  . Marital status: Single,    Spouse name: N/A  . Number of children: 3   Occupational History  . Dental asstnt   Social History Main Topics  . Smoking status: Never Smoker  . Smokeless tobacco: Never Used  . Alcohol use No  . Drug use: No   Current Outpatient Medications  Medication Sig Dispense Refill  . acetaminophen (TYLENOL) 500 MG tablet Take 500 mg by mouth 3 times/day as needed-between meals & bedtime.    Marland Kitchen. atenolol (TENORMIN) 25 MG tablet Take 1 tablet (25 mg total) by mouth daily. 60 tablet 5  . estrogens,  conjugated, (PREMARIN) 0.625 MG tablet Take 1 tablet (0.625 mg total) by mouth daily. Take daily for 21 days then do not take for 7 days. 30 tablet 0  . methimazole (TAPAZOLE) 5 MG tablet Take by mouth10 mg in am and 5 mg in pm with meals 135 tablet 2   No current facility-administered medications for this visit.    No Known Allergies Family History  Problem Relation Age of Onset  . Kidney disease Mother    PE: BP 140/80   Pulse 73   Ht 5' 2.75" (1.594 m)   Wt 162 lb 12.8 oz (73.8 kg)   SpO2 98%   Breastfeeding? Yes   BMI  29.07 kg/m  Wt Readings from Last 3 Encounters:  10/30/17 162 lb 12.8 oz (73.8 kg)  07/10/17 154 lb 9.6 oz (70.1 kg)  04/24/17 150 lb (68 kg)   Constitutional: Normal weight, in NAD Eyes: PERRLA, EOMI, no exophthalmos ENT: moist mucous membranes, + slight, symmetric, thyromegaly, no cervical lymphadenopathy Cardiovascular: RRR, No MRG Respiratory: CTA B Gastrointestinal: abdomen soft, NT, ND, BS+ Musculoskeletal: no deformities, strength intact in all 4 Skin: moist, warm, no rashes Neurological: no tremor with outstretched hands, DTR normal in all 4  ASSESSMENT: 1.  Graves' disease  PLAN:  1. Patient with history of thyrotoxicosis, diagnosed as Graves' disease based on elevated TSI antibodies.  Therefore, we bypassed the thyroid uptake and scan.  We initially started methimazole 10 mg in a.m. and 5 mg in p.m., however, unfortunately, she did not return for labs afterwards.  We also started atenolol 25 mg initially once a day and then increase to twice a day since she was tachycardic.  She did complain about dizziness at last visit after taking atenolol so we decreased the dose to 25 mg daily at last visit.  At last visit, we check her TFTs and her TSH was still suppressed while her free hormones were normal.  We continued the same methimazole dose but I strongly advised her to be 31.5 months to repeat the tests.  She did not return for these... - At this visit, I again emphasized the importance of getting labs as recommended to be able to control her disease - We will check her TFTs today and adjust the methimazole dose accordingly - We again discussed about other methods of treatment for Graves' disease including RAI ablation and surgery, but I do not feel that these are indicated for now and she agrees - At last visit, she was still breast-feeding and we discussed that methimazole can cross into breast milk, but usually the quantities very small if the dose of methimazole is Lower than 30  mg daily. Now b'feeding only once a day >> tries to stop. - I will see her back in 6 months, but hopefully sooner for repeat labs.  - time spent with the patient: 15 minutes, of which >50% was spent in obtaining information about her symptoms, reviewing her previous labs, evaluations, and treatments, counseling her about her condition (please see the discussed topics above), and developing a plan to further investigate and treat it; she had a number of questions which I addressed.  Office Visit on 10/30/2017  Component Date Value Ref Range Status  . TSH 10/30/2017 1.82  0.35 - 4.50 uIU/mL Final  . Free T4 10/30/2017 0.83  0.60 - 1.60 ng/dL Final   Comment: Specimens from patients who are undergoing biotin therapy and /or ingesting biotin supplements may contain high levels of biotin.  The  higher biotin concentration in these specimens interferes with this Free T4 assay.  Specimens that contain high levels  of biotin may cause false high results for this Free T4 assay.  Please interpret results in light of the total clinical presentation of the patient.    . T3, Free 10/30/2017 3.0  2.3 - 4.2 pg/mL Final   Normal TFTs.  Advised to decrease the methimazole dose to 5 mg twice a day and return for labs in 1.5 months.  Carlus Pavlov, MD PhD [facility] Endocrinology

## 2017-12-11 ENCOUNTER — Other Ambulatory Visit (INDEPENDENT_AMBULATORY_CARE_PROVIDER_SITE_OTHER): Payer: No Typology Code available for payment source

## 2017-12-11 DIAGNOSIS — E0500 Thyrotoxicosis with diffuse goiter without thyrotoxic crisis or storm: Secondary | ICD-10-CM | POA: Diagnosis not present

## 2017-12-11 LAB — T4, FREE: Free T4: 0.85 ng/dL (ref 0.60–1.60)

## 2017-12-11 LAB — TSH: TSH: 1.6 u[IU]/mL (ref 0.35–4.50)

## 2017-12-11 LAB — T3, FREE: T3, Free: 2.9 pg/mL (ref 2.3–4.2)

## 2017-12-11 MED ORDER — METHIMAZOLE 5 MG PO TABS: ORAL_TABLET | ORAL | 2 refills | Status: DC

## 2017-12-11 NOTE — Addendum Note (Signed)
Addended by: Carlus Pavlov on: 12/11/2017 04:48 PM   Modules accepted: Orders

## 2018-01-18 ENCOUNTER — Ambulatory Visit: Payer: Self-pay

## 2018-01-18 NOTE — Telephone Encounter (Signed)
Pt. Reports she has seen bright red blood in toilet and when she wipes the past two times she has had a stool. Had "very loose stool." No constipation. Has slight abdominal pain - midline of abd. No fever. No availability with a female provider. Instructed if symptoms worsen, will go to ED. Will call tomorrow to see if anything has opened up.  Reason for Disposition . MODERATE rectal bleeding (small blood clots, passing blood without stool, or toilet water turns red)  Answer Assessment - Initial Assessment Questions 1. APPEARANCE of BLOOD: "What color is it?" "Is it passed separately, on the surface of the stool, or mixed in with the stool?"      Bright red  2. AMOUNT: "How much blood was passed?"      Unsure 3. FREQUENCY: "How many times has blood been passed with the stools?"       X 2  4. ONSET: "When was the blood first seen in the stools?" (Days or weeks)      Yesterday 5. DIARRHEA: "Is there also some diarrhea?" If so, ask: "How many diarrhea stools were passed in past 24 hours?"      Diarrhea - lose stool 6. CONSTIPATION: "Do you have constipation?" If so, "How bad is it?"     No 7. RECURRENT SYMPTOMS: "Have you had blood in your stools before?" If so, ask: "When was the last time?" and "What happened that time?"      No 8. BLOOD THINNERS: "Do you take any blood thinners?" (e.g., Coumadin/warfarin, Pradaxa/dabigatran, aspirin)     No 9. OTHER SYMPTOMS: "Do you have any other symptoms?"  (e.g., abdominal pain, vomiting, dizziness, fever)     Low abdominal pain - middle. Chills 10. PREGNANCY: "Is there any chance you are pregnant?" "When was your last menstrual period?"       No  Protocols used: RECTAL BLEEDING-A-AH

## 2018-01-19 ENCOUNTER — Ambulatory Visit: Payer: Self-pay

## 2018-01-19 NOTE — Telephone Encounter (Signed)
Patient called in with c/o "blood in stool." She says "it happened 2 days ago twice. The water was red and my stool is loose. I didn't see blood clots." I asked about diarrhea, she says "my stools are loose all the time, so this is not new." I asked about other symptoms, she says "I have cold chills and tingling when I urinate." According to protocol, see PCP within 2 weeks. She says she would like to be seen on Friday around 1330-1400. I advised there is a 1340 appointment slot, but I am not able to schedule. I asked her to hold while I call to the office to ask for the block to be opened, I called and spoke to [place], [facility] who says to send the message over and she will get it to someone who can unblock that time and call the patient back with the appointment. I advised the patient, she verbalized understanding.   Reason for Disposition . [1] Rectal bleeding is minimal (e.g., blood just on toilet paper, few drops, streaks on surface of normal formed BM) AND [2] bleeding recurs 3 or more times on treatment  Answer Assessment - Initial Assessment Questions 1. APPEARANCE of BLOOD: "What color is it?" "Is it passed separately, on the surface of the stool, or mixed in with the stool?"      Red, watery stool 2. AMOUNT: "How much blood was passed?"      No idea 3. FREQUENCY: "How many times has blood been passed with the stools?"      Twice (2 different days) 4. ONSET: "When was the blood first seen in the stools?" (Days or weeks)      2 days ago 5. DIARRHEA: "Is there also some diarrhea?" If so, ask: "How many diarrhea stools were passed in past 24 hours?"      No 6. CONSTIPATION: "Do you have constipation?" If so, "How bad is it?"     No 7. RECURRENT SYMPTOMS: "Have you had blood in your stools before?" If so, ask: "When was the last time?" and "What happened that time?"      Once 2-3 years ago 8. BLOOD THINNERS: "Do you take any blood thinners?" (e.g., Coumadin/warfarin, Pradaxa/dabigatran,  aspirin)     No 9. OTHER SYMPTOMS: "Do you have any other symptoms?"  (e.g., abdominal pain, vomiting, dizziness, fever)     Cold chills, tingling when urinate 10. PREGNANCY: "Is there any chance you are pregnant?" "When was your last menstrual period?"       No; LMP last month  Protocols used: RECTAL BLEEDING-A-AH

## 2018-01-19 NOTE — Telephone Encounter (Signed)
Pt has appt scheduled for tomorrow 01/20/18 with Whitney,PA.

## 2018-01-20 ENCOUNTER — Encounter: Payer: Self-pay | Admitting: Physician Assistant

## 2018-01-20 ENCOUNTER — Other Ambulatory Visit: Payer: Self-pay

## 2018-01-20 ENCOUNTER — Ambulatory Visit (INDEPENDENT_AMBULATORY_CARE_PROVIDER_SITE_OTHER): Payer: No Typology Code available for payment source | Admitting: Physician Assistant

## 2018-01-20 VITALS — BP 145/82 | HR 69 | Temp 98.7°F | Resp 18 | Ht 62.75 in | Wt 168.2 lb

## 2018-01-20 DIAGNOSIS — K625 Hemorrhage of anus and rectum: Secondary | ICD-10-CM | POA: Diagnosis not present

## 2018-01-20 DIAGNOSIS — K649 Unspecified hemorrhoids: Secondary | ICD-10-CM | POA: Diagnosis not present

## 2018-01-20 MED ORDER — HYDROCORTISONE 2.5 % RE CREA: 1.0000 "application " | TOPICAL_CREAM | Freq: Two times a day (BID) | RECTAL | 0 refills | Status: DC

## 2018-01-20 NOTE — Patient Instructions (Addendum)
  Rectal cream: Hydrocortisone 2.5%: Apply sparingly, up to twice daily. Use this only as needed.  Be sure to have good posture with lifting and be sure to have strong stomach muscles when lifting - don't strain.    Follow these instructions at home: Eating and drinking  Eat foods that have a lot of fiber in them, such as whole grains, beans, nuts, fruits, and vegetables. Ask your health care provider about taking products that have added fiber (fiber supplements).  Drink enough fluid to keep your urine clear or pale yellow. Managing pain and swelling  Take warm sitz baths for 20 minutes, 3-4 times a day to ease pain and discomfort.  If directed, apply ice to the affected area. Using ice packs between sitz baths may be helpful. ? Put ice in a plastic bag. ? Place a towel between your skin and the bag. ? Leave the ice on for 20 minutes, 2-3 times a day. General instructions  Take over-the-counter and prescription medicines only as told by your health care provider.  Use medicated creams or suppositories as told.  Exercise regularly.  Go to the bathroom when you have the urge to have a bowel movement. Do not wait.  Avoid straining to have bowel movements.  Keep the anal area dry and clean. Use wet toilet paper or moist towelettes after a bowel movement.  Do not sit on the toilet for long periods of time. This increases blood pooling and pain.  Come back if you are not getting better.    Thank you for coming in today. I hope you feel we met your needs.  Feel free to call PCP if you have any questions or further requests.  Please consider signing up for MyChart if you do not already have it, as this is a great way to communicate with me.  Best,  [employer], PA-C

## 2018-01-20 NOTE — Progress Notes (Signed)
   Tammy Byrd  MRN: [id number] DOB: 10/29/1980  PCP: Magdalene RiverWiseman, Brittany D, PA-C  Subjective:  Pt is a 37 year old female who presents to clinic for rectal bleeding. One episode happened three days ago after one episode of diarrhea. She noticed bright red blood on the tissue after wiping. No blood since then. No blood in the toilet bowel. Denies rectal pain, blood in stool, constipation.   Has BM daily. Denies straining with BM.   she lifts heavy load at work. Does not have great lifting posture.  She has h/o hemorrhoids   Review of Systems  Gastrointestinal: Positive for anal bleeding. Negative for abdominal pain, blood in stool, constipation and rectal pain.  Neurological: Negative for dizziness and light-headedness.    Patient Active Problem List   Diagnosis Date Noted  . Breakthrough bleeding on Implanon 07/10/2017  . Graves disease 11/25/2016  . History of gestational diabetes 10/16/2015  . History of preterm delivery 09/04/2015  . History of severe pre-eclampsia 08/16/2015  . 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 Sickle cell trait (HCC) 04/14/2015    Current Outpatient Medications on File Prior to Visit  Medication Sig Dispense Refill  . acetaminophen (TYLENOL) 500 MG tablet Take 350 mg by mouth 3 times/day as needed-between meals & bedtime.     Marland Kitchen. atenolol (TENORMIN) 25 MG tablet Take 1 tablet (25 mg total) by mouth daily. 60 tablet 5  . methimazole (TAPAZOLE) 5 MG tablet Take by mouth 5 mg in am and 2.5 mg in pm with meals 180 tablet 2   No current facility-administered medications on file prior to visit.     No Known Allergies   Objective:  BP (!) 145/82   Pulse 69   Temp 98.7 F (37.1 C) (Oral)   Resp 18   Ht 5' 2.75" (1.594 m)   Wt 168 lb 3.2 oz (76.3 kg)   SpO2 99%   BMI 30.03 kg/m   Physical Exam  Constitutional: She appears well-developed and well-nourished.  Genitourinary:    Pelvic  exam was performed with patient supine.  Skin: Skin is warm and dry.  Psychiatric: She has a normal mood and affect. Thought content normal.  Vitals reviewed.   Assessment and Plan :  1. Hemorrhoids, unspecified hemorrhoid type 2. BRBPR (bright red blood per rectum) - pt presents c/o one episode of BRBPR after diarrhea 3 days ago. She is asymptomatic now. HPI and PE suggests mild external, reducible and nonthrombosed hemorrhoid. Advised fiber, sitz baths and topical anusol as needed. RTC if no resolution.  - hydrocortisone (ANUSOL-HC) 2.5 % rectal cream; Place 1 application rectally 2 (two) times daily.  Dispense: 30 g; Refill: 0   Whitney Clara Herbison, PA-C  [facility] 01/20/2018 5:05 PM  Please note: Portions of this report may have been transcribed using dragon voice recognition software. Every effort was made to ensure accuracy; however, inadvertent computerized transcription errors may be present.

## 2018-03-24 ENCOUNTER — Ambulatory Visit: Payer: No Typology Code available for payment source | Admitting: Emergency Medicine

## 2018-04-13 ENCOUNTER — Ambulatory Visit: Payer: No Typology Code available for payment source | Admitting: Family Medicine

## 2018-04-30 ENCOUNTER — Ambulatory Visit: Payer: No Typology Code available for payment source | Admitting: Internal Medicine

## 2018-04-30 ENCOUNTER — Other Ambulatory Visit: Payer: Self-pay

## 2018-04-30 ENCOUNTER — Encounter: Payer: Self-pay | Admitting: Internal Medicine

## 2018-04-30 VITALS — BP 120/70 | HR 86 | Ht 62.75 in | Wt 169.0 lb

## 2018-04-30 DIAGNOSIS — E0500 Thyrotoxicosis with diffuse goiter without thyrotoxic crisis or storm: Secondary | ICD-10-CM | POA: Diagnosis not present

## 2018-04-30 LAB — T4, FREE: Free T4: 0.82 ng/dL (ref 0.60–1.60)

## 2018-04-30 LAB — T3, FREE: T3, Free: 3 pg/mL (ref 2.3–4.2)

## 2018-04-30 LAB — TSH: TSH: 2.87 u[IU]/mL (ref 0.35–4.50)

## 2018-04-30 MED ORDER — METHIMAZOLE 5 MG PO TABS: ORAL_TABLET | ORAL | 2 refills | Status: DC

## 2018-04-30 NOTE — Progress Notes (Signed)
Patient ID: Tammy Byrd, female   DOB: 1980-02-27, 38 y.o.   MRN: [id number]    HPI  Tammy Byrd is a 38 y.o.-year-old female, returning for follow-up for Graves' disease.  Last visit 6 months ago.  Reviewed and addended history: She had 2 UTIs in 07/2016 and started to feel poorly afterwards.  She saw her PCP in 08/2016 for palpitations, fatigue, increased sweating.  TFTs were in the thyrotoxic range.  She was referred to endocrinology.  We confirmed thyrotoxicosis and also her TSI antibodies returned elevated, confirming Graves' disease:  Lab Results  Component Value Date   TSI 263 (H) 11/19/2016   We initially started methimazole 10 mg in a.m. and 5 mg in p.m. in 11/2016.  We also started atenolol, dose decreased to 25 mg daily in the past due to dizziness.  At last visit, thyroid test improved so we decrease the methimazole dose to 5 mg twice a day.  We continued atenolol  Reviewed patient's TFTs: Lab Results  Component Value Date   TSH 1.60 12/11/2017   TSH 1.82 10/30/2017   TSH <0.01 Repeated and verified X2. (L) 04/24/2017   TSH <0.01 Repeated and verified X2. (L) 11/19/2016   TSH <0.006 (L) 09/10/2016   TSH <0.006 (L) 08/27/2016   TSH 1.040 11/22/2009   FREET4 0.85 12/11/2017   FREET4 0.83 10/30/2017   FREET4 0.94 04/24/2017   FREET4 3.74 (H) 11/19/2016   FREET4 4.50 (H) 09/10/2016    Pt denies: - feeling nodules in neck - hoarseness - dysphagia - choking - SOB with lying down  Pt does not have a FH of thyroid ds.  She has a family history of of thyroid cancer in mother.  No h/o radiation tx to head or neck.  No seaweed or kelp. No recent contrast studies. No herbal supplements. No Biotin use. No recent steroids use.  She has a history of HTN and GDM during pregnancy  ROS: Constitutional: no weight gain/no weight loss, no fatigue, no subjective hyperthermia, no subjective hypothermia Eyes: no blurry vision, no xerophthalmia ENT: no sore throat, + see  HPI Cardiovascular: no CP/no SOB/no palpitations/no leg swelling Respiratory: no cough/no SOB/no wheezing Gastrointestinal: no N/no V/no D/no C/no acid reflux Musculoskeletal: no muscle aches/no joint aches Skin: no rashes, no hair loss Neurological: no tremors/no numbness/no tingling/no dizziness  I reviewed pt's medications, allergies, PMH, social hx, family hx, and changes were documented in the history of present illness. Otherwise, unchanged from my initial visit note.  Past Medical History:  Diagnosis Date  . 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 GDM (gestational diabetes mellitus)   . History of severe pre-eclampsia    Past Surgical History:  Procedure Laterality Date  . CESAREAN SECTION N/A 09/04/2015   Procedure: CESAREAN SECTION;  Surgeon: Levie Heritage, DO;  Location: [facility];  Service: Obstetrics;  Laterality: N/A;  . NO PAST SURGERIES     Social History   Social History  . Marital status: Single,    Spouse name: N/A  . Number of children: 3   Occupational History  . Dental asstnt   Social History Main Topics  . Smoking status: Never Smoker  . Smokeless tobacco: Never Used  . Alcohol use No  . Drug use: No   Current Outpatient Medications  Medication Sig Dispense Refill  . acetaminophen (TYLENOL) 500 MG tablet Take 350 mg by mouth 3 times/day as needed-between meals & bedtime.     Marland Kitchen atenolol (TENORMIN) 25 MG tablet  Take 1 tablet (25 mg total) by mouth daily. 60 tablet 5  . hydrocortisone (ANUSOL-HC) 2.5 % rectal cream Place 1 application rectally 2 (two) times daily. 30 g 0  . methimazole (TAPAZOLE) 5 MG tablet Take by mouth 5 mg in am and 2.5 mg in pm with meals 180 tablet 2   No current facility-administered medications for this visit.    No Known Allergies Family History  Problem Relation Age of Onset  . Kidney disease Mother    PE: BP 120/70   Pulse 86   Ht 5' 2.75" (1.594 m)   Wt 169 lb (76.7 kg)   SpO2 99%   BMI 30.18  kg/m  Wt Readings from Last 3 Encounters:  04/30/18 169 lb (76.7 kg)  01/20/18 168 lb 3.2 oz (76.3 kg)  10/30/17 162 lb 12.8 oz (73.8 kg)   Constitutional: Slightly overweight, in NAD Eyes: PERRLA, EOMI, no exophthalmos ENT: moist mucous membranes, + mild symmetric thyromegaly, no cervical lymphadenopathy Cardiovascular: RRR, No MRG Respiratory: CTA B Gastrointestinal: abdomen soft, NT, ND, BS+ Musculoskeletal: no deformities, strength intact in all 4 Skin: moist, warm, no rashes Neurological: no tremor with outstretched hands, DTR normal in all 4  ASSESSMENT: 1.  Graves' disease  PLAN:  1. Patient with history of thyrotoxicosis, diagnosed as Graves' disease based on elevated TSI antibodies.  Therefore, we did not perform a thyroid uptake and scan.  We initially started methimazole 10 mg a.m. and 5 mg in p.m. and atenolol 25 mg twice a day.  As she complains of dizziness after starting atenolol, we decreased the dose to 25 mg daily, which he tolerates well and continues today. Her pulse at home is in the 60s >> will start tapering the Atenolol to off. -At last visit, her thyroid tests were improved so we decrease the methimazole to 5 mg twice a day.  Subsequent repeat TFTs were still normal in 11/2017. -At this visit, we will recheck her TFTs and adjust the methimazole dose accordingly.  -We again discussed about other methods of treatment for Graves' disease including RAI ablation and surgery.  However, since she is responding well to methimazole, I do not feel that these are necessary for now and patient concurs. -I will see her back in 6 months but likely sooner for repeat labs  - time spent with the patient: 15 min, of which >50% was spent in obtaining information about her symptoms, reviewing her previous labs, evaluations, and treatments, counseling her about her condition (please see the discussed topics above), and developing a plan to further investigate and treat it; she had a  number of questions which I addressed.  Needs refills for MMI.  Office Visit on 04/30/2018  Component Date Value Ref Range Status  . TSH 04/30/2018 2.87  0.35 - 4.50 uIU/mL Final  . Free T4 04/30/2018 0.82  0.60 - 1.60 ng/dL Final   Comment: Specimens from patients who are undergoing biotin therapy and /or ingesting biotin supplements may contain high levels of biotin.  The higher biotin concentration in these specimens interferes with this Free T4 assay.  Specimens that contain high levels  of biotin may cause false high results for this Free T4 assay.  Please interpret results in light of the total clinical presentation of the patient.    . T3, Free 04/30/2018 3.0  2.3 - 4.2 pg/mL Final  TSI's pending.  TFTs normal >> we will decrease the methimazole to 5 mg daily and have her back for recheck TFTs  in 1.5 months.  Carlus Pavlov, MD PhD [facility] Endocrinology

## 2018-04-30 NOTE — Patient Instructions (Addendum)
Please continue: - Methimazole 5 mg 2x a day   Please decrease: - Atenolol to 12.5 mg for the next week, then stop if your pulse is normal  Please stop at the lab.  Please return in 6 months.

## 2018-05-03 ENCOUNTER — Other Ambulatory Visit: Payer: Self-pay | Admitting: Internal Medicine

## 2018-05-03 DIAGNOSIS — E0500 Thyrotoxicosis with diffuse goiter without thyrotoxic crisis or storm: Secondary | ICD-10-CM

## 2018-05-03 MED ORDER — METHIMAZOLE 5 MG PO TABS: ORAL_TABLET | ORAL | 3 refills | Status: DC

## 2018-05-04 LAB — THYROID STIMULATING IMMUNOGLOBULIN

## 2018-05-16 ENCOUNTER — Other Ambulatory Visit: Payer: Self-pay | Admitting: Internal Medicine

## 2018-07-28 ENCOUNTER — Encounter: Payer: Self-pay | Admitting: Family Medicine

## 2018-07-28 ENCOUNTER — Ambulatory Visit: Payer: No Typology Code available for payment source | Admitting: Family Medicine

## 2018-07-28 ENCOUNTER — Other Ambulatory Visit: Payer: Self-pay

## 2018-07-28 VITALS — BP 140/82 | HR 86 | Temp 98.9°F | Ht 62.0 in | Wt 171.8 lb

## 2018-07-28 DIAGNOSIS — N939 Abnormal uterine and vaginal bleeding, unspecified: Secondary | ICD-10-CM | POA: Diagnosis not present

## 2018-07-28 DIAGNOSIS — D649 Anemia, unspecified: Secondary | ICD-10-CM | POA: Diagnosis not present

## 2018-07-28 NOTE — Progress Notes (Signed)
Acute Office Visit  Subjective:    Patient ID: Tammy Byrd, female    DOB: 01/15/1981, 38 y.o.   MRN: [id number]  Chief Complaint  Patient presents with  . lymph note enlarge    menstrations may cause calf numbness on both  legs    HPI Patient is here today for concerns about her menstrual periods being irregular -pt states she has continued to have spotting in between extended cycles that can last as long as 2 weeks - she did not get up at night to change pads and changes a pad 2x daily.   Pt had Implanon placed 3 years ago.  Pt would like Implanon removed due to the irregular period-worsening.  Pt is concerned due to her weakness and leg pain about possible anemia. Pt with h/o of anemia with pregnancies. Pt takes thyroid medication and has been tested for levels but no additional bloodwork completed.  Pt with concern for DM-h/o gestational diabetes.   Endocrinologist  following-thyroid levels/medication  Past Medical History:  Diagnosis Date  . 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 GDM (gestational diabetes mellitus)   . History of severe pre-eclampsia     Past Surgical History:  Procedure Laterality Date  . CESAREAN SECTION N/A 09/04/2015   Procedure: CESAREAN SECTION;  Surgeon: Truett Mainland, DO;  Location: [facility];  Service: Obstetrics;  Laterality: N/A;  . NO PAST SURGERIES      Family History  Problem Relation Age of Onset  . Kidney disease Mother     Social History   Socioeconomic History  . Marital status: Single    Spouse name: Not on file  . Number of children: Not on file  . Years of education: Not on file  . Highest education level: Not on file  Occupational History  . Not on file  Social Needs  . Financial resource strain: Not on file  . Food insecurity:    Worry: Not on file    Inability: Not on file  . Transportation needs:    Medical: Not on file    Non-medical: Not on file  Tobacco Use  . Smoking status: Never Smoker  .  Smokeless tobacco: Never Used  Substance and Sexual Activity  . Alcohol use: No  . Drug use: No  . Sexual activity: Yes    Birth control/protection: Implant  Lifestyle  . Physical activity:    Days per week: Not on file    Minutes per session: Not on file  . Stress: Not on file  Relationships  . Social connections:    Talks on phone: Not on file    Gets together: Not on file    Attends religious service: Not on file    Active member of club or organization: Not on file    Attends meetings of clubs or organizations: Not on file    Relationship status: Not on file  . Intimate partner violence:    Fear of current or ex partner: Not on file    Emotionally abused: Not on file    Physically abused: Not on file    Forced sexual activity: Not on file  Other Topics Concern  . Not on file  Social History Narrative  . Not on file    Outpatient Medications Prior to Visit  Medication Sig Dispense Refill  . atenolol (TENORMIN) 25 MG tablet TAKE 1 TABLET BY MOUTH ONCE DAILY 90 tablet 2  . hydrocortisone (ANUSOL-HC) 2.5 % rectal cream Place 1  application rectally 2 (two) times daily. 30 g 0  . methimazole (TAPAZOLE) 5 MG tablet Take by mouth 5 mg once a day with a meal 60 tablet 3  . acetaminophen (TYLENOL) 500 MG tablet Take 350 mg by mouth 3 times/day as needed-between meals & bedtime.      No facility-administered medications prior to visit.     No Known Allergies  Review of Systems  Constitutional: Positive for malaise/fatigue. Negative for fever.  Neurological: Negative for dizziness and headaches.  Endo/Heme/Allergies: Does not bruise/bleed easily.  leg cramping bilat calves     Objective:    Physical Exam  Constitutional: She appears well-developed and well-nourished.  Eyes: Conjunctivae are normal.  Cardiovascular: Normal rate and regular rhythm.  Pulmonary/Chest: Effort normal and breath sounds normal.  Abdominal: Soft. Bowel sounds are normal.    BP 140/82 (BP  Location: Right Arm, Patient Position: Sitting, Cuff Size: Normal)   Pulse 86   Temp 98.9 F (37.2 C) (Oral)   Ht 5' 2"  (1.575 m)   Wt 171 lb 12.8 oz (77.9 kg)   LMP 07/07/2018   SpO2 97%   BMI 31.42 kg/m  Wt Readings from Last 3 Encounters:  07/28/18 171 lb 12.8 oz (77.9 kg)  04/30/18 169 lb (76.7 kg)  01/20/18 168 lb 3.2 oz (76.3 kg)    Health Maintenance Due  Topic Date Due  . HEMOGLOBIN A1C  07/16/80  . PNEUMOCOCCAL POLYSACCHARIDE VACCINE AGE 42-64 HIGH RISK  02/24/1982  . FOOT EXAM  02/24/1990  . OPHTHALMOLOGY EXAM  02/24/1990  . URINE MICROALBUMIN  02/24/1990  . PAP SMEAR-Modifier  04/10/2018     Lab Results  Component Value Date   TSH 2.87 04/30/2018   Lab Results  Component Value Date   WBC 5.5 08/27/2016   HGB 12.2 08/27/2016   HCT 38.0 08/27/2016   MCV 68.0 (A) 08/27/2016   PLT 178 09/05/2015   Lab Results  Component Value Date   NA 136 09/04/2015   K 4.3 09/04/2015   CO2 21 (L) 09/04/2015   GLUCOSE 76 09/04/2015   BUN 7 09/04/2015   CREATININE 0.35 (L) 09/04/2015   BILITOT 0.4 09/04/2015   ALKPHOS 84 09/04/2015   AST 28 09/04/2015   ALT 12 (L) 09/04/2015   PROT 5.6 (L) 09/04/2015   ALBUMIN 2.8 (L) 09/04/2015   CALCIUM 9.2 09/04/2015   ANIONGAP 6 09/04/2015   No results found for: CHOL No results found for: HDL No results found for: LDLCALC No results found for: TRIG No results found for: CHOLHDL No results found for: HGBA1C     Assessment & Plan:   Problem List Items Addressed This Visit    None     1. Abnormal uterine bleeding (AUB) - CBC with Differential - CMP14+EGFR - Anemia panel  2. Anemia, unspecified type - CBC with Differential - CMP14+EGFR - Anemia panel Concern for leg pain and fatigue  Hannah Beat, MD

## 2018-07-28 NOTE — Patient Instructions (Addendum)
     If you have lab work done today you will be contacted with your lab results within the next 2 weeks.  If you have not heard from [place] then please contact us. The fastest way to get your results is to register for My Chart. Will f/u for labs results  IF you received an x-ray today, you will receive an invoice from [facility]. Please contact [facility] at [phone] with questions or concerns regarding your invoice.   IF you received labwork today, you will receive an invoice from [facility]. Please contact [facility] at [phone] with questions or concerns regarding your invoice.   Our billing staff will not be able to assist you with questions regarding bills from these companies.  You will be contacted with the lab results as soon as they are available. The fastest way to get your results is to activate your My Chart account. Instructions are located on the last page of this paperwork. If you have not heard from [place] regarding the results in 2 weeks, please contact this office.     Please schedule pt for removal of Implanon on a Friday-Dr. Nolon Rod Denice [place] Pt needs a primary care provider

## 2018-07-29 LAB — ANEMIA PANEL
Ferritin: 121 ng/mL (ref 15–150)
Folate, Hemolysate: 257 ng/mL
Folate, RBC: 646 ng/mL (ref >498)
Hematocrit: 39.8 % (ref 34.0–46.6)
Iron Saturation: 23 % (ref 15–55)
Iron: 71 ug/dL (ref 27–159)
Retic Ct Pct: 0.8 % (ref 0.6–2.6)
Total Iron Binding Capacity: 308 ug/dL (ref 250–450)
UIBC: 237 ug/dL (ref 131–425)
Vitamin B-12: 538 pg/mL (ref 232–1245)

## 2018-07-29 LAB — CMP14+EGFR
ALT: 15 IU/L (ref 0–32)
AST: 21 IU/L (ref 0–40)
Albumin/Globulin Ratio: 1.9 (ref 1.2–2.2)
Albumin: 4.8 g/dL (ref 3.8–4.8)
Alkaline Phosphatase: 38 IU/L — ABNORMAL LOW (ref 39–117)
BUN/Creatinine Ratio: 20 (ref 9–23)
BUN: 12 mg/dL (ref 6–20)
Bilirubin Total: 0.7 mg/dL (ref 0.0–1.2)
CO2: 22 mmol/L (ref 20–29)
Calcium: 9.3 mg/dL (ref 8.7–10.2)
Chloride: 102 mmol/L (ref 96–106)
Creatinine, Ser: 0.59 mg/dL (ref 0.57–1.00)
GFR calc Af Amer: 134 mL/min/{1.73_m2} (ref >59)
GFR calc non Af Amer: 117 mL/min/{1.73_m2} (ref >59)
Globulin, Total: 2.5 g/dL (ref 1.5–4.5)
Glucose: 90 mg/dL (ref 65–99)
Potassium: 4.2 mmol/L (ref 3.5–5.2)
Sodium: 138 mmol/L (ref 134–144)
Total Protein: 7.3 g/dL (ref 6.0–8.5)

## 2018-07-29 LAB — CBC WITH DIFFERENTIAL/PLATELET
Basophils Absolute: 0 10*3/uL (ref 0.0–0.2)
Basos: 0 %
EOS (ABSOLUTE): 0.1 10*3/uL (ref 0.0–0.4)
Eos: 1 %
Hemoglobin: 12.5 g/dL (ref 11.1–15.9)
Immature Grans (Abs): 0 10*3/uL (ref 0.0–0.1)
Immature Granulocytes: 0 %
Lymphocytes Absolute: 2.8 10*3/uL (ref 0.7–3.1)
Lymphs: 40 %
MCH: 22.4 pg — ABNORMAL LOW (ref 26.6–33.0)
MCHC: 31.4 g/dL — ABNORMAL LOW (ref 31.5–35.7)
MCV: 71 fL — ABNORMAL LOW (ref 79–97)
Monocytes Absolute: 0.3 10*3/uL (ref 0.1–0.9)
Monocytes: 5 %
Neutrophils Absolute: 3.7 10*3/uL (ref 1.4–7.0)
Neutrophils: 54 %
Platelets: 358 10*3/uL (ref 150–450)
RBC: 5.58 x10E6/uL — ABNORMAL HIGH (ref 3.77–5.28)
RDW: 15.7 % — ABNORMAL HIGH (ref 11.7–15.4)
WBC: 6.8 10*3/uL (ref 3.4–10.8)

## 2018-08-17 ENCOUNTER — Other Ambulatory Visit: Payer: Self-pay

## 2018-08-17 ENCOUNTER — Encounter: Payer: Self-pay | Admitting: Family Medicine

## 2018-08-17 ENCOUNTER — Ambulatory Visit (INDEPENDENT_AMBULATORY_CARE_PROVIDER_SITE_OTHER): Payer: No Typology Code available for payment source | Admitting: Family Medicine

## 2018-08-17 VITALS — BP 149/93 | HR 86 | Temp 98.2°F | Resp 17 | Ht 62.0 in | Wt 173.6 lb

## 2018-08-17 DIAGNOSIS — I10 Essential (primary) hypertension: Secondary | ICD-10-CM | POA: Diagnosis not present

## 2018-08-17 DIAGNOSIS — Z30013 Encounter for initial prescription of injectable contraceptive: Secondary | ICD-10-CM | POA: Diagnosis not present

## 2018-08-17 DIAGNOSIS — Z3046 Encounter for surveillance of implantable subdermal contraceptive: Secondary | ICD-10-CM

## 2018-08-17 MED ORDER — MEDROXYPROGESTERONE ACETATE 150 MG/ML IM SUSP
150.0000 mg | Freq: Once | INTRAMUSCULAR | Status: AC
  Administered 2018-08-17: 150 mg via INTRAMUSCULAR

## 2018-08-17 NOTE — Patient Instructions (Addendum)
Please take once a day iron called SLOW FE  Next depo due Sept. 15-29, 2020.  Pt aware and given dates to return.     If you have lab work done today you will be contacted with your lab results within the next 2 weeks.  If you have not heard from us then please contact us. The fastest way to get your results is to register for My Chart.   IF you received an x-ray today, you will receive an invoice from [facility]. Please contact [facility] at [phone] with questions or concerns regarding your invoice.   IF you received labwork today, you will receive an invoice from [facility]. Please contact [facility] at [phone] with questions or concerns regarding your invoice.   Our billing staff will not be able to assist you with questions regarding bills from these companies.  You will be contacted with the lab results as soon as they are available. The fastest way to get your results is to activate your My Chart account. Instructions are located on the last page of this paperwork. If you have not heard from us regarding the results in 2 weeks, please contact this office.     Contraception Choices Contraception, also called birth control, means things to use or ways to try not to get pregnant. Hormonal birth control This kind of birth control uses hormones. Here are some types of hormonal birth control:  A tube that is put under skin of the arm (implant). The tube can stay in for as long as 3 years.  Shots to get every 3 months (injections).  Pills to take every day (birth control pills).  A patch to change 1 time each week for 3 weeks (birth control patch). After that, the patch is taken off for 1 week.  A ring to put in the vagina. The ring is left in for 3 weeks. Then it is taken out of the vagina for 1 week. Then a new ring is put in.  Pills to take after unprotected sex (emergency birth control pills). Barrier birth control Here are some types of barrier birth  control:  A thin covering that is put on the penis before sex (female condom). The covering is thrown away after sex.  A soft, loose covering that is put in the vagina before sex (female condom). The covering is thrown away after sex.  A rubber bowl that sits over the cervix (diaphragm). The bowl must be made for you. The bowl is put into the vagina before sex. The bowl is left in for 6-8 hours after sex. It is taken out within 24 hours.  A small, soft cup that fits over the cervix (cervical cap). The cup must be made for you. The cup can be left in for 6-8 hours after sex. It is taken out within 48 hours.  A sponge that is put into the vagina before sex. It must be left in for at least 6 hours after sex. It must be taken out within 30 hours. Then it is thrown away.  A chemical that kills or stops sperm from getting into the uterus (spermicide). It may be a pill, cream, jelly, or foam to put in the vagina. The chemical should be used at least 10-15 minutes before sex. IUD (intrauterine) birth control An IUD is a small, T-shaped piece of plastic. It is put inside the uterus. There are two kinds:  Hormone IUD. This kind can stay in for 3-5 years.  Copper  IUD. This kind can stay in for 10 years. Permanent birth control Here are some types of permanent birth control:  Surgery to block the fallopian tubes.  Having an insert put into each fallopian tube.  Surgery to tie off the tubes that carry sperm (vasectomy). Natural planning birth control Here are some types of natural planning birth control:  Not having sex on the days the woman could get pregnant.  Using a calendar: ? To keep track of the length of each period. ? To find out what days pregnancy can happen. ? To plan to not have sex on days when pregnancy can happen.  Watching for symptoms of ovulation and not having sex during ovulation. One way the woman can check for ovulation is to check her temperature.  Waiting to have sex  until after ovulation. Summary  Contraception, also called birth control, means things to use or ways to try not to get pregnant.  Hormonal methods of birth control include implants, injections, pills, patches, vaginal rings, and emergency birth control pills.  Barrier methods of birth control can include female condoms, female condoms, diaphragms, cervical caps, sponges, and spermicides.  There are two types of IUD (intrauterine device) birth control. An IUD can be put in a woman's uterus to prevent pregnancy for 3-5 years.  Permanent sterilization can be done through a procedure for males, females, or both.  Natural planning methods involve not having sex on the days when the woman could get pregnant. This information is not intended to replace advice given to you by your health care provider. Make sure you discuss any questions you have with your health care provider. Document Released: 12/01/2008 Document Revised: 05/26/2018 Document Reviewed: 02/14/2016 Elsevier Patient Education  2020 [employer].

## 2018-08-17 NOTE — Progress Notes (Signed)
Established Patient Office Visit  Subjective:  Patient ID: Tammy Byrd, female    DOB: 25-Mar-1980  Age: 38 y.o. MRN: [id number]  CC:  Chief Complaint  Patient presents with  . nexplanon removal  . Medication Refill    hydrocortisone cream (anusol)    HPI Tammy Byrd presents for   nexplanon removal nexplanon placed 3 years ago She is currently breastfeeding Has amenorrhea due to breastfeeding and nexplanon  Nonsmoker Denies migraines No history of cancer or blood clots   Hypertension: Patient here for follow-up of elevated blood pressure. She is not exercising and is adherent to low salt diet.  Blood pressure is well controlled at home. Cardiac symptoms none. Patient denies chest pain, claudication, dyspnea, fatigue, irregular heart beat and orthopnea.  Cardiovascular risk factors: hypertension. Use of agents associated with hypertension: none. History of target organ damage: none. BP Readings from Last 3 Encounters:  08/17/18 (!) 149/93  07/28/18 140/82  04/30/18 120/70     Past Medical History:  Diagnosis Date  . 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 GDM (gestational diabetes mellitus)   . History of severe pre-eclampsia     Past Surgical History:  Procedure Laterality Date  . CESAREAN SECTION N/A 09/04/2015   Procedure: CESAREAN SECTION;  Surgeon: Truett Mainland, DO;  Location: [facility];  Service: Obstetrics;  Laterality: N/A;  . NO PAST SURGERIES      Family History  Problem Relation Age of Onset  . Kidney disease Mother     Social History   Socioeconomic History  . Marital status: Single    Spouse name: Not on file  . Number of children: Not on file  . Years of education: Not on file  . Highest education level: Not on file  Occupational History  . Not on file  Social Needs  . Financial resource strain: Not on file  . Food insecurity    Worry: Not on file    Inability: Not on file  . Transportation needs    Medical: Not on file   Non-medical: Not on file  Tobacco Use  . Smoking status: Never Smoker  . Smokeless tobacco: Never Used  Substance and Sexual Activity  . Alcohol use: No  . Drug use: No  . Sexual activity: Yes    Birth control/protection: Implant  Lifestyle  . Physical activity    Days per week: Not on file    Minutes per session: Not on file  . Stress: Not on file  Relationships  . Social Herbalist on phone: Not on file    Gets together: Not on file    Attends religious service: Not on file    Active member of club or organization: Not on file    Attends meetings of clubs or organizations: Not on file    Relationship status: Not on file  . Intimate partner violence    Fear of current or ex partner: Not on file    Emotionally abused: Not on file    Physically abused: Not on file    Forced sexual activity: Not on file  Other Topics Concern  . Not on file  Social History Narrative  . Not on file    Outpatient Medications Prior to Visit  Medication Sig Dispense Refill  . atenolol (TENORMIN) 25 MG tablet TAKE 1 TABLET BY MOUTH ONCE DAILY 90 tablet 2  . hydrocortisone (ANUSOL-HC) 2.5 % rectal cream Place 1 application rectally 2 (two) times daily.  30 g 0  . methimazole (TAPAZOLE) 5 MG tablet Take by mouth 5 mg once a day with a meal 60 tablet 3   No facility-administered medications prior to visit.     No Known Allergies  ROS Review of Systems Review of Systems  Constitutional: Negative for activity change, appetite change, chills and fever.  HENT: Negative for congestion, nosebleeds, trouble swallowing and voice change.   Respiratory: Negative for cough, shortness of breath and wheezing.   Gastrointestinal: Negative for diarrhea, nausea and vomiting.  Genitourinary: Negative for difficulty urinating, dysuria, flank pain and hematuria.  Musculoskeletal: Negative for back pain, joint swelling and neck pain.  Neurological: Negative for dizziness, speech difficulty,  light-headedness and numbness.  See HPI. All other review of systems negative.     Objective:    Physical Exam  BP (!) 149/93 (BP Location: Right Arm, Patient Position: Sitting, Cuff Size: Normal) Comment: no meds today  Pulse 86   Temp 98.2 F (36.8 C) (Oral)   Resp 17   Ht 5\' 2"  (1.575 m)   Wt 173 lb 9.6 oz (78.7 kg)   LMP 07/27/2018   SpO2 98%   BMI 31.75 kg/m  Wt Readings from Last 3 Encounters:  08/17/18 173 lb 9.6 oz (78.7 kg)  07/28/18 171 lb 12.8 oz (77.9 kg)  04/30/18 169 lb (76.7 kg)    Physical Exam  Constitutional: Oriented to person, place, and time. Appears well-developed and well-nourished.  HENT:  Head: Normocephalic and atraumatic.  Eyes: Conjunctivae and EOM are normal.  Cardiovascular: Normal rate, regular rhythm, normal heart sounds and intact distal pulses.  No murmur heard. Pulmonary/Chest: Effort normal and breath sounds normal. No stridor. No respiratory distress. Has no wheezes.  Neurological: Is alert and oriented to person, place, and time.  Skin: Skin is warm. Capillary refill takes less than 2 seconds.  Psychiatric: Has a normal mood and affect. Behavior is normal. Judgment and thought content normal.    Nexplanon Implant Removal Procedure Note  After discussion of contraception options, the patient wishes to proceed with removal of the Nexplanon device. Written informed consent was obtained and the procedure was reviewed.  Prior to the procedure being performed, a "time out" was performed that confirmed the correct patient, procedure and site.  The implant capsule was located and the site was cleaned with alcohol. With injection of 1ml of 1% lidocaine with epi, local anesthesia was achieved. The area was prepped with betadine. Using sterile technique, a stab incision with the scalpel was made, the implant capsule was grasped with a hemostat and removed. The provider and patient verified that the capsule was removed intact. Pressure was applied  to the area and steri-strips were used to close the skin opening.  A pressure dressing was applied.   The patient tolerated the procedure well, with minimal blood loss and no complications.  She was instructed to remove the pressure bandage this evening, the band aid after 24 hours, and the steri-strips in 5-7 days. She was instructed to call for increasing pain, fever, redness, warmth, pus, or any concern for infection.  She was advised to take acetaminophen and/or ibuprofen as needed for pain relief.   [x]  Professional charges for removal have been entered into charge capture     Health Maintenance Due  Topic Date Due  . HEMOGLOBIN A1C  05-Aug-1980  . PNEUMOCOCCAL POLYSACCHARIDE VACCINE AGE 94-64 HIGH RISK  02/24/1982  . FOOT EXAM  02/24/1990  . OPHTHALMOLOGY EXAM  02/24/1990  . URINE  MICROALBUMIN  02/24/1990  . PAP SMEAR-Modifier  04/10/2018    There are no preventive care reminders to display for this patient.  Lab Results  Component Value Date   TSH 2.87 04/30/2018   Lab Results  Component Value Date   WBC 6.8 07/28/2018   HGB 12.5 07/28/2018   HCT 39.8 07/28/2018   MCV 71 (L) 07/28/2018   PLT 358 07/28/2018   Lab Results  Component Value Date   NA 138 07/28/2018   K 4.2 07/28/2018   CO2 22 07/28/2018   GLUCOSE 90 07/28/2018   BUN 12 07/28/2018   CREATININE 0.59 07/28/2018   BILITOT 0.7 07/28/2018   ALKPHOS 38 (L) 07/28/2018   AST 21 07/28/2018   ALT 15 07/28/2018   PROT 7.3 07/28/2018   ALBUMIN 4.8 07/28/2018   CALCIUM 9.3 07/28/2018   ANIONGAP 6 09/04/2015     Assessment & Plan:   Problem List Items Addressed This Visit    None    Visit Diagnoses    Encounter for Nexplanon removal    -  Primary Tolerated removal well   Encounter for initial prescription of injectable contraceptive     Discussed options and since pt breastfeeding Depo is a good option Discussed common side effects    Relevant Medications   medroxyPROGESTERone (DEPO-PROVERA) injection  150 mg   Essential hypertension    -  Not at goal, but patient admits to being nervous Will have patient follow up closely Continue current meds Discussed that estrogen should be avoided       Meds ordered this encounter  Medications  . medroxyPROGESTERone (DEPO-PROVERA) injection 150 mg    Follow-up: No follow-ups on file.    Doristine BosworthZoe A Makena Murdock, MD

## 2018-11-05 ENCOUNTER — Other Ambulatory Visit: Payer: Self-pay | Admitting: Internal Medicine

## 2018-11-05 ENCOUNTER — Other Ambulatory Visit: Payer: Self-pay

## 2018-11-05 ENCOUNTER — Encounter: Payer: Self-pay | Admitting: Internal Medicine

## 2018-11-05 ENCOUNTER — Ambulatory Visit: Payer: No Typology Code available for payment source | Admitting: Internal Medicine

## 2018-11-05 VITALS — BP 138/80 | HR 90 | Ht 62.0 in | Wt 175.0 lb

## 2018-11-05 DIAGNOSIS — E0500 Thyrotoxicosis with diffuse goiter without thyrotoxic crisis or storm: Secondary | ICD-10-CM | POA: Diagnosis not present

## 2018-11-05 LAB — T4, FREE: Free T4: 1.04 ng/dL (ref 0.60–1.60)

## 2018-11-05 LAB — T3, FREE: T3, Free: 3.5 pg/mL (ref 2.3–4.2)

## 2018-11-05 LAB — TSH: TSH: 2.63 u[IU]/mL (ref 0.35–4.50)

## 2018-11-05 MED ORDER — ATENOLOL 25 MG PO TABS: 25.0000 mg | ORAL_TABLET | Freq: Every day | ORAL | 2 refills | Status: DC

## 2018-11-05 MED ORDER — METHIMAZOLE 5 MG PO TABS: ORAL_TABLET | ORAL | 3 refills | Status: DC

## 2018-11-05 NOTE — Patient Instructions (Addendum)
Please continue Methimazole 5 mg 1x a day.  Please stop at the lab.  Please return in 6 months.  

## 2018-11-05 NOTE — Progress Notes (Signed)
Patient ID: Tammy Byrd, female   DOB: [birth year], 38 y.o.   MRN: [id number]    HPI  Barry Culverhouse is a 38 y.o.-year-old female, returning for follow-up for Graves' disease.  Last visit 6 months ago.  Reviewed and addended history: She had 2 UTIs in 07/2016 and started to feel poorly afterwards.  She saw her PCP in 08/2016 for palpitations, fatigue, increased sweating.  TFTs were in the thyrotoxic range.  She was referred to endocrinology.  We confirmed thyrotoxicosis and also her TSI antibodies returned elevated, confirming Graves' disease:  Lab Results  Component Value Date   TSI <89 04/30/2018   TSI 263 (H) 11/19/2016   We initially started methimazole 10 mg in a.m. and 5 mg in p.m. in 11/2016.  We also started atenolol, dose decreased to 25 mg daily in the past due to dizziness.  In 11/2017, we were able to decrease the methimazole to 5 mg twice a day.  We continued atenolol.  At last visit in 04/2018, we decreased the methimazole to 5 mg once a day.  She did not return for labs in 1.5 months, as advised.  She occasionally misses MMI doses  -2x this week.  Reviewed patient's TFTs: Lab Results  Component Value Date   TSH 2.87 04/30/2018   TSH 1.60 12/11/2017   TSH 1.82 10/30/2017   TSH <0.01 Repeated and verified X2. (L) 04/24/2017   TSH <0.01 Repeated and verified X2. (L) 11/19/2016   TSH <0.006 (L) 09/10/2016   TSH <0.006 (L) 08/27/2016   TSH 1.040 11/22/2009   FREET4 0.82 04/30/2018   FREET4 0.85 12/11/2017   FREET4 0.83 10/30/2017   FREET4 0.94 04/24/2017   FREET4 3.74 (H) 11/19/2016   FREET4 4.50 (H) 09/10/2016    Pt denies: - feeling nodules in neck - hoarseness - dysphagia - choking - SOB with lying down  No family history of thyroid disease except thyroid cancer in mother.  No history of radiation treatment to head or neck.  No seaweed or kelp. No recent contrast studies. No herbal supplements. No Biotin use. No recent steroids use.   She has a history of HTN and  GDM during pregnancy  ROS: Constitutional: + weight gain/no weight loss, no fatigue, no subjective hyperthermia, no subjective hypothermia Eyes: no blurry vision, no xerophthalmia ENT: no sore throat, + see HPI Cardiovascular: no CP/no SOB/no palpitations/no leg swelling Respiratory: no cough/no SOB/no wheezing Gastrointestinal: no N/no V/no D/no C/no acid reflux Musculoskeletal: no muscle aches/no joint aches Skin: no rashes, no hair loss Neurological: no tremors/no numbness/no tingling/no dizziness  I reviewed pt's medications, allergies, PMH, social hx, family hx, and changes were documented in the history of present illness. Otherwise, unchanged from my initial visit note.  Past Medical History:  Diagnosis Date  . 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 GDM (gestational diabetes mellitus)   . History of severe pre-eclampsia    Past Surgical History:  Procedure Laterality Date  . CESAREAN SECTION N/A 09/04/2015   Procedure: CESAREAN SECTION;  Surgeon: Truett Mainland, DO;  Location: [facility];  Service: Obstetrics;  Laterality: N/A;  . NO PAST SURGERIES     Social History   Social History  . Marital status: Single,    Spouse name: N/A  . Number of children: 3   Occupational History  . Dental asstnt   Social History Main Topics  . Smoking status: Never Smoker  . Smokeless tobacco: Never Used  . Alcohol use No  .  Drug use: No   Current Outpatient Medications  Medication Sig Dispense Refill  . atenolol (TENORMIN) 25 MG tablet TAKE 1 TABLET BY MOUTH ONCE DAILY 90 tablet 2  . hydrocortisone (ANUSOL-HC) 2.5 % rectal cream Place 1 application rectally 2 (two) times daily. 30 g 0  . methimazole (TAPAZOLE) 5 MG tablet Take by mouth 5 mg once a day with a meal 60 tablet 3   No current facility-administered medications for this visit.    No Known Allergies Family History  Problem Relation Age of Onset  . Kidney disease Mother    PE: BP 138/80   Pulse 90    Ht 5\' 2"  (1.575 m)   Wt 175 lb (79.4 kg)   SpO2 99%   BMI 32.01 kg/m  Wt Readings from Last 3 Encounters:  11/05/18 175 lb (79.4 kg)  08/17/18 173 lb 9.6 oz (78.7 kg)  07/28/18 171 lb 12.8 oz (77.9 kg)   Constitutional: overweight, in NAD Eyes: PERRLA, EOMI, no exophthalmos ENT: moist mucous membranes, + mild symmetric thyromegaly, no cervical lymphadenopathy Cardiovascular: RRR, No MRG Respiratory: CTA B Gastrointestinal: abdomen soft, NT, ND, BS+ Musculoskeletal: no deformities, strength intact in all 4 Skin: moist, warm, no rashes Neurological: no tremor with outstretched hands, DTR normal in all 4  ASSESSMENT: 1.  Graves' disease  PLAN:  1. Patient with history of thyrotoxicosis, diagnosed as Graves' disease based on elevated TSI antibodies.  Therefore, we did not perform a thyroid uptake and scan.  We initially started methimazole 10 mg in a.m. and 5 mg in p.m. and atenolol 25 mg twice a day.  She developed dizziness on atenolol so we decreased the dose to 25 mg daily, which she tolerated well.  At last visit, pulse was in the 60s so we started to taper atenolol to off.  She is now off the beta-blocker. -At last visit, thyroid tests were improved and normal so we decreased the methimazole to 5 mg once a day.  She did not come back for repeat labs in 1.5 months, as advised... She is euthyroid, and has no complaints except for weight gain. -At this visit, we will check her TFTs and adjust the methimazole dose accordingly -We again discussed about other methods of treatment for Graves' disease, including RAI ablation and surgery.  However, she is responding very well to methimazole so I would not recommend these treatments for now.  Patient agrees. -I will see her back in 6 months but sooner for labs  - time spent with the patient: 15 minutes, of which >50% was spent in obtaining information about her symptoms, reviewing her previous labs, evaluations, and treatments, counseling  her about her condition (please see the discussed topics above), and developing a plan to further investigate and treat it; she had a number of questions which I addressed.  Office Visit on 11/05/2018  Component Date Value Ref Range Status  . T3, Free 11/05/2018 3.5  2.3 - 4.2 pg/mL Final  . Free T4 11/05/2018 1.04  0.60 - 1.60 ng/dL Final   Comment: Specimens from patients who are undergoing biotin therapy and /or ingesting biotin supplements may contain high levels of biotin.  The higher biotin concentration in these specimens interferes with this Free T4 assay.  Specimens that contain high levels  of biotin may cause false high results for this Free T4 assay.  Please interpret results in light of the total clinical presentation of the patient.    Marland Kitchen. TSH 11/05/2018 2.63  0.35 -  4.50 uIU/mL Final   TFTs are normal.  We will plan to decrease the methimazole to 2.5 mg daily and recheck the test in 5 to 6 weeks.  Carlus Pavlov, MD PhD [facility]

## 2018-11-07 ENCOUNTER — Other Ambulatory Visit: Payer: Self-pay | Admitting: Physician Assistant

## 2018-11-07 DIAGNOSIS — K649 Unspecified hemorrhoids: Secondary | ICD-10-CM

## 2018-11-08 NOTE — Telephone Encounter (Signed)
Requested medication (s) are due for refill today: yes  Requested medication (s) are on the active medication list: yes  Last refill:  01/20/2018  Future visit scheduled: no  Notes to clinic:  Ordering provider is different than pcp    Requested Prescriptions  Pending Prescriptions Disp Refills   hydrocortisone (ANUSOL-HC) 2.5 % rectal cream [Pharmacy Med Name: HYDROCORTISONE 2.5% RECTAL CREAM] 30 g     Sig: USE RECTALLY TWICE DAILY     Off-Protocol Failed - 11/07/2018  5:32 PM      Failed - Medication not assigned to a protocol, review manually.      Passed - Valid encounter within last 12 months    Recent Outpatient Visits          2 months ago Encounter for Nexplanon removal   Primary Care at [facility], [place], MD   3 months ago Abnormal uterine bleeding (AUB)   Primary Care at [facility], Rex Kras, MD   9 months ago Hemorrhoids, unspecified hemorrhoid type   Primary Care at [facility], Gelene Mink, PA-C   2 years ago Low TSH level   Primary Care at [place], [place] D, PA-C   2 years ago Chills   Primary Care at [place], [place] D, PA-C            Over the Counter:  OTC Passed - 11/07/2018  5:32 PM      Passed - Valid encounter within last 12 months    Recent Outpatient Visits          2 months ago Encounter for Nexplanon removal   Primary Care at [facility], Som[place], MD   3 months ago Abnormal uterine bleeding (AUB)   Primary Care at [facility], Rex Kras, MD   9 months ago Hemorrhoids, unspecified hemorrhoid type   Primary Care at [facility], Gelene Mink, PA-C   2 years ago Low TSH level   Primary Care at [place], [place] D, PA-C   2 years ago Chills   Primary Care at [place], [place] D, [place]

## 2019-05-04 ENCOUNTER — Other Ambulatory Visit: Payer: Self-pay

## 2019-05-06 ENCOUNTER — Ambulatory Visit: Payer: No Typology Code available for payment source | Admitting: Internal Medicine

## 2019-06-07 ENCOUNTER — Ambulatory Visit (INDEPENDENT_AMBULATORY_CARE_PROVIDER_SITE_OTHER): Payer: No Typology Code available for payment source | Admitting: Family Medicine

## 2019-06-07 ENCOUNTER — Other Ambulatory Visit: Payer: Self-pay

## 2019-06-07 VITALS — BP 126/80 | HR 73 | Temp 98.0°F | Ht 62.0 in | Wt 175.0 lb

## 2019-06-07 DIAGNOSIS — E0500 Thyrotoxicosis with diffuse goiter without thyrotoxic crisis or storm: Secondary | ICD-10-CM | POA: Diagnosis not present

## 2019-06-07 DIAGNOSIS — Z0000 Encounter for general adult medical examination without abnormal findings: Secondary | ICD-10-CM

## 2019-06-07 DIAGNOSIS — Z0001 Encounter for general adult medical examination with abnormal findings: Secondary | ICD-10-CM | POA: Diagnosis not present

## 2019-06-07 NOTE — Progress Notes (Signed)
Chief Complaint  Patient presents with  . Annual Exam    Subjective:  Tammy Byrd is a 39 y.o. female here for a health maintenance visit.  Patient is established pt    Patient Active Problem List   Diagnosis Date Noted  . Abnormal uterine bleeding (AUB) 07/28/2018  . Anemia 07/28/2018  . Breakthrough bleeding on Implanon 07/10/2017  . Graves disease 11/25/2016  . History of gestational diabetes 10/16/2015  . History of preterm delivery 09/04/2015  . History of severe pre-eclampsia 08/16/2015  . 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 Sickle cell trait ([place]) 04/14/2015    Past Medical History:  Diagnosis Date  . 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 GDM (gestational diabetes mellitus)   . History of severe pre-eclampsia     Past Surgical History:  Procedure Laterality Date  . CESAREAN SECTION N/A 09/04/2015   Procedure: CESAREAN SECTION;  Surgeon: Truett Mainland, DO;  Location: [place];  Service: Obstetrics;  Laterality: N/A;  . NO PAST SURGERIES       Outpatient Medications Prior to Visit  Medication Sig Dispense Refill  . atenolol (TENORMIN) 25 MG tablet Take 1 tablet (25 mg total) by mouth daily. 30 tablet 2  . hydrocortisone (ANUSOL-HC) 2.5 % rectal cream USE RECTALLY TWICE DAILY 30 g 0  . methimazole (TAPAZOLE) 5 MG tablet Take by mouth 2.5 mg once a day with a meal 45 tablet 3   No facility-administered medications prior to visit.    No Known Allergies   Family History  Problem Relation Age of Onset  . Kidney disease Mother      Health Habits: Dental Exam: up to date Eye Exam: up to date Exercise: 3 times/week on average Current exercise activities: walking/running (she stands 9 hours a day at work) Diet: balanced diet, eats some snacks  Social History   Socioeconomic History  . Marital status: Single    Spouse name: Not on file  . Number of children: Not on  file  . Years of education: Not on file  . Highest education level: Not on file  Occupational History  . Not on file  Tobacco Use  . Smoking status: Never Smoker  . Smokeless tobacco: Never Used  Substance and Sexual Activity  . Alcohol use: No  . Drug use: No  . Sexual activity: Yes    Birth control/protection: Implant  Other Topics Concern  . Not on file  Social History Narrative  . Not on file   Social Determinants of Health   Financial Resource Strain:   . Difficulty of Paying Living Expenses:   Food Insecurity:   . Worried About Charity fundraiser in the Last Year:   . Arboriculturist in the Last Year:   Transportation Needs:   . Film/video editor (Medical):   Marland Kitchen Lack of Transportation (Non-Medical):   Physical Activity:   . Days of Exercise per Week:   . Minutes of Exercise per Session:   Stress:   . Feeling of Stress :   Social Connections:   . Frequency of Communication with Friends and Family:   . Frequency of Social Gatherings with Friends and Family:   . Attends Religious Services:   . Active Member of Clubs or Organizations:   . Attends Archivist Meetings:   Marland Kitchen Marital Status:   Intimate Partner Violence:   . Fear of Current or  Ex-Partner:   . Emotionally Abused:   Marland Kitchen Physically Abused:   . Sexually Abused:    Social History   Substance and Sexual Activity  Alcohol Use No   Social History   Tobacco Use  Smoking Status Never Smoker  Smokeless Tobacco Never Used   Social History   Substance and Sexual Activity  Drug Use No    GYN: Sexual Health Menstrual status: regular menses LMP: Patient's last menstrual period was 06/03/2019. Last pap smear: see HM section History of abnormal pap smears:  Sexually active: with female partner She has no contraception currently She uses condoms to prevent pregnancy  Health Maintenance: See under health Maintenance activity for review of completion dates as well. Immunization History   Administered Date(s) Administered  . Influenza,inj,Quad PF,6+ Mos 04/11/2015  . Tdap 07/26/2015      Depression Screen-PHQ2/9 Depression screen [facility] 2/9 06/07/2019 08/17/2018 07/28/2018 01/20/2018 09/10/2016  Decreased Interest 0 0 0 0 0  Down, Depressed, Hopeless 0 0 0 0 0  PHQ - 2 Score 0 0 0 0 0       Depression Severity and Treatment Recommendations:  0-4= None  5-9= Mild / Treatment: Support, educate to call if worse; return in one month  10-14= Moderate / Treatment: Support, watchful waiting; Antidepressant or Psycotherapy  15-19= Moderately severe / Treatment: Antidepressant OR Psychotherapy  >= 20 = Major depression, severe / Antidepressant AND Psychotherapy    Review of Systems   ROS  See HPI for ROS as well.   Review of Systems  Constitutional: Negative for activity change, appetite change, chills and fever.  HENT: Negative for congestion, nosebleeds, trouble swallowing and voice change.   Respiratory: Negative for cough, shortness of breath and wheezing.   Gastrointestinal: Negative for diarrhea, nausea and vomiting.  Genitourinary: Negative for difficulty urinating, dysuria, flank pain and hematuria.  Musculoskeletal: Negative for back pain, joint swelling and neck pain.  Neurological: Negative for dizziness, speech difficulty, light-headedness and numbness.  See HPI. All other review of systems negative.   Objective:   Vitals:   06/07/19 0922 06/07/19 0926  BP: (!) 153/96 126/80  Pulse: 73   Temp: 98 F (36.7 C)   TempSrc: Temporal   SpO2: 99%   Weight: 175 lb (79.4 kg)   Height: 5' 2"  (1.575 m)     Body mass index is 32.01 kg/m.  Physical Exam  BP 126/80 (BP Location: Left Arm, Patient Position: Sitting, Cuff Size: Large)   Pulse 73   Temp 98 F (36.7 C) (Temporal)   Ht 5' 2"  (1.575 m)   Wt 175 lb (79.4 kg)   LMP 06/03/2019   SpO2 99%   BMI 32.01 kg/m   General Appearance:    Alert, cooperative, no distress, appears stated age  Head:     Normocephalic, without obvious abnormality, atraumatic  Eyes:    conjunctiva/corneas clear, EOM's intact  Ears:    Normal TM's and external ear canals, both ears  Nose:   Nares normal, septum midline, mucosa normal, no drainage    or sinus tenderness  Throat:   Lips, mucosa, and tongue normal; teeth and gums normal  Neck:   Supple, symmetrical, trachea midline, no adenopathy;    thyroid:  no enlargement/tenderness/nodules  Back:     Symmetric, no curvature, ROM normal, no CVA tenderness  Lungs:     Clear to auscultation bilaterally, respirations unlabored  Chest Wall:    No tenderness or deformity   Heart:    Regular rate  and rhythm, S1 and S2 normal, no murmur, rub   or gallop  Abdomen:     Soft, non-tender, bowel sounds active all four quadrants,    no masses, no organomegaly  Extremities:   Extremities normal, atraumatic, no cyanosis or edema  Pulses:   2+ and symmetric all extremities  Skin:   Skin color, texture, turgor normal, no rashes or lesions  Lymph nodes:   Cervical, supraclavicular, and axillary nodes normal  Neurologic:   CNII-XII intact, normal strength, sensation and reflexes    throughout      Assessment/Plan:   Patient was seen for a health maintenance exam.  Counseled the patient on health maintenance issues. Reviewed her health mainteance schedule and ordered appropriate tests (see orders.) Counseled on regular exercise and weight management. Recommend regular eye exams and dental cleaning.   The following issues were addressed today for health maintenance:   Deronda was seen today for annual exam.  Diagnoses and all orders for this visit:  Annual physical exam  -  Women's Health Maintenance Plan Advised monthly breast exam and annual mammogram Advised dental exam every six months Discussed stress management Discussed pap smear screening guidelines, pap is managed by Gynecology  -     POCT urinalysis dipstick  Graves disease - discussed continuation of  medication  Follow up with Dr. Cruzita Lederer -     CMP14+EGFR -     TSH -     Lipid panel -     CBC with Differential -     T4, Free  Other orders -     Cancel: Microalbumin, urine    No follow-ups on file.    Body mass index is 32.01 kg/m.:  Discussed the patient's BMI with patient. The BMI body mass index is 32.01 kg/m.     Future Appointments  Date Time Provider [place]  07/14/2019 11:00 AM Philemon Kingdom, MD [id number] None    There are no Patient Instructions on file for this visit.

## 2019-06-07 NOTE — Patient Instructions (Signed)
Health Maintenance, Female Adopting a healthy lifestyle and getting preventive care are important in promoting health and wellness. Ask your health care provider about:  The right schedule for you to have regular tests and exams.  Things you can do on your own to prevent diseases and keep yourself healthy. What should I know about diet, weight, and exercise? Eat a healthy diet   Eat a diet that includes plenty of vegetables, fruits, low-fat dairy products, and lean protein.  Do not eat a lot of foods that are high in solid fats, added sugars, or sodium. Maintain a healthy weight Body mass index (BMI) is used to identify weight problems. It estimates body fat based on height and weight. Your health care provider can help determine your BMI and help you achieve or maintain a healthy weight. Get regular exercise Get regular exercise. This is one of the most important things you can do for your health. Most adults should:  Exercise for at least 150 minutes each week. The exercise should increase your heart rate and make you sweat (moderate-intensity exercise).  Do strengthening exercises at least twice a week. This is in addition to the moderate-intensity exercise.  Spend less time sitting. Even light physical activity can be beneficial. Watch cholesterol and blood lipids Have your blood tested for lipids and cholesterol at 39 years of age, then have this test every 5 years. Have your cholesterol levels checked more often if:  Your lipid or cholesterol levels are high.  You are older than 40 years of age.  You are at high risk for heart disease. What should I know about cancer screening? Depending on your health history and family history, you may need to have cancer screening at various ages. This may include screening for:  Breast cancer.  Cervical cancer.  Colorectal cancer.  Skin cancer.  Lung cancer. What should I know about heart disease, diabetes, and high blood  pressure? Blood pressure and heart disease  High blood pressure causes heart disease and increases the risk of stroke. This is more likely to develop in people who have high blood pressure readings, are of African descent, or are overweight.  Have your blood pressure checked: ? Every 3-5 years if you are 18-39 years of age. ? Every year if you are 40 years old or older. Diabetes Have regular diabetes screenings. This checks your fasting blood sugar level. Have the screening done:  Once every three years after age 40 if you are at a normal weight and have a low risk for diabetes.  More often and at a younger age if you are overweight or have a high risk for diabetes. What should I know about preventing infection? Hepatitis B If you have a higher risk for hepatitis B, you should be screened for this virus. Talk with your health care provider to find out if you are at risk for hepatitis B infection. Hepatitis C Testing is recommended for:  Everyone born from 1945 through 1965.  Anyone with known risk factors for hepatitis C. Sexually transmitted infections (STIs)  Get screened for STIs, including gonorrhea and chlamydia, if: ? You are sexually active and are younger than 39 years of age. ? You are older than 39 years of age and your health care provider tells you that you are at risk for this type of infection. ? Your sexual activity has changed since you were last screened, and you are at increased risk for chlamydia or gonorrhea. Ask your health care provider if   you are at risk.  Ask your health care provider about whether you are at high risk for HIV. Your health care provider may recommend a prescription medicine to help prevent HIV infection. If you choose to take medicine to prevent HIV, you should first get tested for HIV. You should then be tested every 3 months for as long as you are taking the medicine. Pregnancy  If you are about to stop having your period (premenopausal) and  you may become pregnant, seek counseling before you get pregnant.  Take 400 to 800 micrograms (mcg) of folic acid every day if you become pregnant.  Ask for birth control (contraception) if you want to prevent pregnancy. Osteoporosis and menopause Osteoporosis is a disease in which the bones lose minerals and strength with aging. This can result in bone fractures. If you are 65 years old or older, or if you are at risk for osteoporosis and fractures, ask your health care provider if you should:  Be screened for bone loss.  Take a calcium or vitamin D supplement to lower your risk of fractures.  Be given hormone replacement therapy (HRT) to treat symptoms of menopause. Follow these instructions at home: Lifestyle  Do not use any products that contain nicotine or tobacco, such as cigarettes, e-cigarettes, and chewing tobacco. If you need help quitting, ask your health care provider.  Do not use street drugs.  Do not share needles.  Ask your health care provider for help if you need support or information about quitting drugs. Alcohol use  Do not drink alcohol if: ? Your health care provider tells you not to drink. ? You are pregnant, may be pregnant, or are planning to become pregnant.  If you drink alcohol: ? Limit how much you use to 0-1 drink a day. ? Limit intake if you are breastfeeding.  Be aware of how much alcohol is in your drink. In the [place] one drink equals one 12 oz bottle of beer (355 mL), one 5 oz glass of wine (148 mL), or one 1 oz glass of hard liquor (44 mL). General instructions  Schedule regular health, dental, and eye exams.  Stay current with your vaccines.  Tell your health care provider if: ? You often feel depressed. ? You have ever been abused or do not feel safe at home. Summary  Adopting a healthy lifestyle and getting preventive care are important in promoting health and wellness.  Follow your health care provider's instructions about healthy  diet, exercising, and getting tested or screened for diseases.  Follow your health care provider's instructions on monitoring your cholesterol and blood pressure. This information is not intended to replace advice given to you by your health care provider. Make sure you discuss any questions you have with your health care provider. Document Revised: 01/27/2018 Document Reviewed: 01/27/2018 Elsevier Patient Education  2020 [employer]  

## 2019-06-08 ENCOUNTER — Other Ambulatory Visit: Payer: Self-pay | Admitting: Internal Medicine

## 2019-06-10 ENCOUNTER — Telehealth: Payer: Self-pay | Admitting: Family Medicine

## 2019-06-10 NOTE — Telephone Encounter (Signed)
Pt stated she got a call from our office stating that we need pt to come back in for her to give the lab more blood for the labs she had taken on 4/20. Needing the orders in for those labs pt stated she will come on Monday for them. Please advise.

## 2019-06-10 NOTE — Telephone Encounter (Signed)
This pt was told she needs to come in to have blood drawn for more labs, I see her orders from 06/07/2019 but they were completed I do not see another note stating what labs she needs if you could order them or let me know what to order that would be appreciated.

## 2019-06-13 LAB — CMP14+EGFR
ALT: 11 IU/L (ref 0–32)
AST: 20 IU/L (ref 0–40)
Albumin/Globulin Ratio: 1.8 (ref 1.2–2.2)
Albumin: 4.8 g/dL (ref 3.8–4.8)
Alkaline Phosphatase: 35 IU/L — ABNORMAL LOW (ref 39–117)
BUN/Creatinine Ratio: 16 (ref 9–23)
BUN: 10 mg/dL (ref 6–20)
Bilirubin Total: 0.5 mg/dL (ref 0.0–1.2)
CO2: 22 mmol/L (ref 20–29)
Calcium: 9.5 mg/dL (ref 8.7–10.2)
Chloride: 106 mmol/L (ref 96–106)
Creatinine, Ser: 0.61 mg/dL (ref 0.57–1.00)
GFR calc Af Amer: 132 mL/min/{1.73_m2} (ref >59)
GFR calc non Af Amer: 115 mL/min/{1.73_m2} (ref >59)
Globulin, Total: 2.6 g/dL (ref 1.5–4.5)
Glucose: 117 mg/dL — ABNORMAL HIGH (ref 65–99)
Potassium: 4.6 mmol/L (ref 3.5–5.2)
Sodium: 142 mmol/L (ref 134–144)
Total Protein: 7.4 g/dL (ref 6.0–8.5)

## 2019-06-13 LAB — CBC WITH DIFFERENTIAL/PLATELET

## 2019-06-13 LAB — LIPID PANEL
Chol/HDL Ratio: 5 ratio — ABNORMAL HIGH (ref 0.0–4.4)
Cholesterol, Total: 234 mg/dL — ABNORMAL HIGH (ref 100–199)
HDL: 47 mg/dL (ref >39)
LDL Chol Calc (NIH): 149 mg/dL — ABNORMAL HIGH (ref 0–99)
Triglycerides: 208 mg/dL — ABNORMAL HIGH (ref 0–149)
VLDL Cholesterol Cal: 38 mg/dL (ref 5–40)

## 2019-06-13 LAB — TSH: TSH: 1.93 u[IU]/mL (ref 0.450–4.500)

## 2019-06-13 LAB — T4, FREE: Free T4: 1.28 ng/dL (ref 0.82–1.77)

## 2019-06-24 ENCOUNTER — Ambulatory Visit: Payer: No Typology Code available for payment source | Admitting: Family Medicine

## 2019-07-14 ENCOUNTER — Encounter: Payer: Self-pay | Admitting: Internal Medicine

## 2019-07-14 ENCOUNTER — Ambulatory Visit: Payer: No Typology Code available for payment source | Admitting: Internal Medicine

## 2019-07-14 ENCOUNTER — Other Ambulatory Visit: Payer: Self-pay

## 2019-07-14 VITALS — BP 128/86 | HR 84 | Ht 62.0 in | Wt 172.0 lb

## 2019-07-14 DIAGNOSIS — E0500 Thyrotoxicosis with diffuse goiter without thyrotoxic crisis or storm: Secondary | ICD-10-CM

## 2019-07-14 LAB — TSH: TSH: 1.56 u[IU]/mL (ref 0.35–4.50)

## 2019-07-14 LAB — T3, FREE: T3, Free: 3.4 pg/mL (ref 2.3–4.2)

## 2019-07-14 LAB — T4, FREE: Free T4: 0.96 ng/dL (ref 0.60–1.60)

## 2019-07-14 NOTE — Patient Instructions (Addendum)
Please continue off methimazole for now.  Please stop at the lab.  Please come back for a follow-up appointment in 6 months.   

## 2019-07-14 NOTE — Progress Notes (Signed)
Patient ID: Tammy Byrd, female   DOB: 01-28-1981, 39 y.o.   MRN: [id number]   This visit occurred during the SARS-CoV-2 public health emergency.  Safety protocols were in place, including screening questions prior to the visit, additional usage of staff PPE, and extensive cleaning of exam room while observing appropriate contact time as indicated for disinfecting solutions.   HPI  Tammy Byrd is a 39 y.o.-year-old female, returning for follow-up for Graves' disease.  Last visit 8 months ago.  Reviewed and addended history: She had 2 UTIs in 07/2016 and started to feel poorly afterwards.  She saw her PCP in 08/2016 for palpitations, fatigue, increased sweating.  TFTs were in the thyrotoxic range.  She was referred to endocrinology.  We confirmed thyrotoxicosis and also her TSI antibodies returned elevated, confirming Graves' disease:  Lab Results  Component Value Date   TSI <89 04/30/2018   TSI 263 (H) 11/19/2016   We initially started methimazole 10 mg in a.m. and 5 mg in p.m. in 11/2016.  We also started atenolol, dose decreased to 25 mg daily in the past due to dizziness.  In 11/2017, we were able to decrease the methimazole to 5 mg twice a day.  We continued atenolol.  At last visit in 04/2018, we decreased the methimazole to 5 mg once a day.  She did not return for labs in 1.5 months, as advised.  At last visit she was telling me that she occasionally missed methimazole doses-twice the week prior to the appointment.  In 10/2018, we ecreased MMI to 2.5 mg daily.  Now off since 05/2019.  Review TFTs: Lab Results  Component Value Date   TSH 1.930 06/07/2019   TSH 2.63 11/05/2018   TSH 2.87 04/30/2018   TSH 1.60 12/11/2017   TSH 1.82 10/30/2017   TSH <0.01 Repeated and verified X2. (L) 04/24/2017   TSH <0.01 Repeated and verified X2. (L) 11/19/2016   TSH <0.006 (L) 09/10/2016   TSH <0.006 (L) 08/27/2016   TSH 1.040 11/22/2009   FREET4 1.28 06/07/2019   FREET4 1.04 11/05/2018    FREET4 0.82 04/30/2018   FREET4 0.85 12/11/2017   FREET4 0.83 10/30/2017   FREET4 0.94 04/24/2017   FREET4 3.74 (H) 11/19/2016   FREET4 4.50 (H) 09/10/2016    Pt denies: - feeling nodules in neck - hoarseness - dysphagia - choking - SOB with lying down  No family history of thyroid disease except thyroid cancer in mother.  No history of radiation treatment to head or neck  No seaweed or kelp. No recent contrast studies. No herbal supplements. No Biotin use. No recent steroids use.   She has a history of HTN and GDM during pregnancy  ROS: Constitutional: no weight gain/no weight loss, no fatigue, no subjective hyperthermia, no subjective hypothermia Eyes: no blurry vision, no xerophthalmia ENT: no sore throat, + see HPI Cardiovascular: no CP/no SOB/no palpitations/no leg swelling Respiratory: no cough/no SOB/no wheezing Gastrointestinal: no N/no V/no D/no C/no acid reflux Musculoskeletal: no muscle aches/no joint aches Skin: no rashes, no hair loss Neurological: no tremors/no numbness/no tingling/no dizziness  I reviewed pt's medications, allergies, PMH, social hx, family hx, and changes were documented in the history of present illness. Otherwise, unchanged from my initial visit note.  Past Medical History:  Diagnosis Date  . 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 GDM (gestational diabetes mellitus)   . History of severe pre-eclampsia    Past Surgical History:  Procedure Laterality Date  . CESAREAN SECTION N/A  09/04/2015   Procedure: CESAREAN SECTION;  Surgeon: Truett Mainland, DO;  Location: [facility];  Service: Obstetrics;  Laterality: N/A;  . NO PAST SURGERIES     Social History   Social History  . Marital status: Single,    Spouse name: N/A  . Number of children: 3   Occupational History  . Dental asstnt   Social History Main Topics  . Smoking status: Never Smoker  . Smokeless tobacco: Never Used  . Alcohol use No  . Drug use: No    Current Outpatient Medications  Medication Sig Dispense Refill  . atenolol (TENORMIN) 25 MG tablet Take 1 tablet (25 mg total) by mouth daily. 30 tablet 2  . hydrocortisone (ANUSOL-HC) 2.5 % rectal cream USE RECTALLY TWICE DAILY 30 g 0   No current facility-administered medications for this visit.   No Known Allergies Family History  Problem Relation Age of Onset  . Kidney disease Mother    PE: BP 128/86   Pulse 84   Ht 5\' 2"  (1.575 m)   Wt 172 lb (78 kg)   SpO2 97%   BMI 31.46 kg/m  Wt Readings from Last 3 Encounters:  07/14/19 172 lb (78 kg)  06/07/19 175 lb (79.4 kg)  11/05/18 175 lb (79.4 kg)   Constitutional: overweight, in NAD Eyes: PERRLA, EOMI, no exophthalmos ENT: moist mucous membranes, + mild symmetric thyromegaly, no cervical lymphadenopathy Cardiovascular: RRR, No MRG Respiratory: CTA B Gastrointestinal: abdomen soft, NT, ND, BS+ Musculoskeletal: no deformities, strength intact in all 4 Skin: moist, warm, no rashes Neurological: no tremor with outstretched hands, DTR normal in all 4  ASSESSMENT: 1.  Graves' disease  PLAN:  1. Patient with history of thyrotoxicosis, diagnosed as Graves' disease based on elevated TSI antibodies.  Therefore, we did not perform a thyroid uptake and scan.  We initially started methimazole 10 mg in a.m. and 5 mg in p.m. and atenolol 25 mg twice a day.  She developed dizziness on atenolol so we decreased the dose to 25 mg daily, which she tolerated well.  We afterwards tapered the atenolol to off.  At last visit, she was off the beta-blocker.  In 04/2018, we were able to decrease the methimazole to 5 mg once a day and we decreased it further in 10/2018, to 2.5 g daily.  In 05/2019, TFTs were normal, so we stopped methimazole completely. -At this visit, she has no complaints that could be related to hyper or hypothyroidism. -At this visit, we will recheck her TFTs and see if we need to restart methimazole -As she responded very well  to methimazole, RAI ablation or thyroidectomy and not indicated for now.  She agrees. -We will see her back in 6 months or sooner for labs.  Office Visit on 07/14/2019  Component Date Value Ref Range Status  . TSH 07/14/2019 1.56  0.35 - 4.50 uIU/mL Final  . Free T4 07/14/2019 0.96  0.60 - 1.60 ng/dL Final   Comment: Specimens from patients who are undergoing biotin therapy and /or ingesting biotin supplements may contain high levels of biotin.  The higher biotin concentration in these specimens interferes with this Free T4 assay.  Specimens that contain high levels  of biotin may cause false high results for this Free T4 assay.  Please interpret results in light of the total clinical presentation of the patient.    . T3, Free 07/14/2019 3.4  2.3 - 4.2 pg/mL Final   The thyroid tests are excellent. No need  to restart methimazole for now.  We can recheck these at next visit.  Carlus Pavlov, MD PhD [facility] Endocrinology

## 2019-08-12 ENCOUNTER — Telehealth: Payer: Self-pay | Admitting: Internal Medicine

## 2019-08-12 ENCOUNTER — Other Ambulatory Visit: Payer: Self-pay | Admitting: Internal Medicine

## 2019-08-12 DIAGNOSIS — E0500 Thyrotoxicosis with diffuse goiter without thyrotoxic crisis or storm: Secondary | ICD-10-CM

## 2019-08-12 NOTE — Telephone Encounter (Signed)
Congratulations! She does not need a new appointment for now, but I put labs in to recheck her thyroid and she can come to have this done as soon as possible.  I also added blood work to check if she is pregnant.  I will let her know about the results as soon as they return.

## 2019-08-12 NOTE — Telephone Encounter (Signed)
Patient states that last time she saw Dr Elvera Lennox, Dr Reece Agar told her she needed to see her sooner than December (patients next appointment) if she was pregnant (and she would need to see her in the first trimester). Patient has missed her monthly cycle and has taken a home test and it states she is pregnant - she has not confirmed it with an OB-GYN yet though. She states she does not have an OB - I gave her the phone number to [facility], but she was concerned that she may not be able to get an appointment there in time to see you and still be in her first trimester.  So would you like patient to go ahead and schedule a follow up with you or would you like a confirmation from the Dr that she is in fact pregnant first?

## 2019-08-12 NOTE — Telephone Encounter (Signed)
Scheduled for labs next Friday.

## 2019-08-19 ENCOUNTER — Other Ambulatory Visit: Payer: Self-pay

## 2019-08-19 ENCOUNTER — Other Ambulatory Visit (INDEPENDENT_AMBULATORY_CARE_PROVIDER_SITE_OTHER): Payer: No Typology Code available for payment source

## 2019-08-19 DIAGNOSIS — E0500 Thyrotoxicosis with diffuse goiter without thyrotoxic crisis or storm: Secondary | ICD-10-CM | POA: Diagnosis not present

## 2019-08-19 LAB — HCG, SERUM, QUALITATIVE: Preg, Serum: POSITIVE — AB

## 2019-08-19 LAB — T4, FREE: Free T4: 1 ng/dL (ref 0.60–1.60)

## 2019-08-19 LAB — T3, FREE: T3, Free: 3 pg/mL (ref 2.3–4.2)

## 2019-08-19 LAB — TSH: TSH: 2.27 u[IU]/mL (ref 0.35–4.50)

## 2019-08-23 ENCOUNTER — Other Ambulatory Visit: Payer: Self-pay | Admitting: Internal Medicine

## 2019-08-23 ENCOUNTER — Encounter: Payer: Self-pay | Admitting: Registered Nurse

## 2019-08-23 ENCOUNTER — Other Ambulatory Visit: Payer: Self-pay

## 2019-08-23 ENCOUNTER — Ambulatory Visit: Payer: No Typology Code available for payment source | Admitting: Registered Nurse

## 2019-08-23 VITALS — BP 125/88 | HR 90 | Temp 97.9°F | Resp 18 | Ht 62.0 in | Wt 173.2 lb

## 2019-08-23 DIAGNOSIS — N926 Irregular menstruation, unspecified: Secondary | ICD-10-CM

## 2019-08-23 DIAGNOSIS — Z3201 Encounter for pregnancy test, result positive: Secondary | ICD-10-CM

## 2019-08-23 DIAGNOSIS — E0500 Thyrotoxicosis with diffuse goiter without thyrotoxic crisis or storm: Secondary | ICD-10-CM

## 2019-08-23 LAB — POCT URINE PREGNANCY: Preg Test, Ur: POSITIVE — AB

## 2019-08-23 NOTE — Patient Instructions (Signed)
° ° ° °  If you have lab work done today you will be contacted with your lab results within the next 2 weeks.  If you have not heard from us then please contact us. The fastest way to get your results is to register for My Chart. ° ° °IF you received an x-ray today, you will receive an invoice from [facility]. Please contact [facility] at [phone] with questions or concerns regarding your invoice.  ° °IF you received labwork today, you will receive an invoice from [facility]. Please contact [facility] at [phone] with questions or concerns regarding your invoice.  ° °Our billing staff will not be able to assist you with questions regarding bills from these companies. ° °You will be contacted with the lab results as soon as they are available. The fastest way to get your results is to activate your My Chart account. Instructions are located on the last page of this paperwork. If you have not heard from us regarding the results in 2 weeks, please contact this office. °  ° ° ° °

## 2019-08-23 NOTE — Progress Notes (Signed)
Acute Office Visit  Subjective:    Patient ID: Tammy Byrd, female    DOB: 08-24-1980, 39 y.o.   MRN: [id number]  Chief Complaint  Patient presents with  . Menstrual Problem    Patient states she has missed her Menstrual cycle for 2 months and coming to check for possible pregnancy. Per patient she doesnt have any of the other symptoms.    HPI Patient is in today for pregnancy test  LMP was 5/15-5/20. Normal menses.  Sexually active, not using protection.  Missed menses in June.  Not having any cramping, pain, lightheadedness, aub, or other concerning symptoms  G3p3. Reports pregnancies complicated by preeclampsia and gestational diabetes Wants referral to obgyn to discuss safety concerns of pregnancy at age 4  Past Medical History:  Diagnosis Date  . 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 GDM (gestational diabetes mellitus)   . History of severe pre-eclampsia     Past Surgical History:  Procedure Laterality Date  . CESAREAN SECTION N/A 09/04/2015   Procedure: CESAREAN SECTION;  Surgeon: Levie Heritage, DO;  Location: [facility];  Service: Obstetrics;  Laterality: N/A;  . NO PAST SURGERIES      Family History  Problem Relation Age of Onset  . Kidney disease Mother     Social History   Socioeconomic History  . Marital status: Single    Spouse name: Not on file  . Number of children: Not on file  . Years of education: Not on file  . Highest education level: Not on file  Occupational History  . Not on file  Tobacco Use  . Smoking status: Never Smoker  . Smokeless tobacco: Never Used  Substance and Sexual Activity  . Alcohol use: No  . Drug use: No  . Sexual activity: Yes    Birth control/protection: Implant  Other Topics Concern  . Not on file  Social History Narrative  . Not on file   Social Determinants of Health   Financial Resource Strain:   . Difficulty of Paying Living Expenses:   Food Insecurity:   . Worried About Brewing technologist in the Last Year:   . Barista in the Last Year:   Transportation Needs:   . Freight forwarder (Medical):   Marland Kitchen Lack of Transportation (Non-Medical):   Physical Activity:   . Days of Exercise per Week:   . Minutes of Exercise per Session:   Stress:   . Feeling of Stress :   Social Connections:   . Frequency of Communication with Friends and Family:   . Frequency of Social Gatherings with Friends and Family:   . Attends Religious Services:   . Active Member of Clubs or Organizations:   . Attends Banker Meetings:   Marland Kitchen Marital Status:   Intimate Partner Violence:   . Fear of Current or Ex-Partner:   . Emotionally Abused:   Marland Kitchen Physically Abused:   . Sexually Abused:     No outpatient medications prior to visit.   No facility-administered medications prior to visit.    No Known Allergies  Review of Systems Per hpi, otherwise negative per 10pt ros    Objective:    Physical Exam Vitals and nursing note reviewed.  Constitutional:      General: She is not in acute distress.    Appearance: Normal appearance. She is normal weight. She is not ill-appearing, toxic-appearing or diaphoretic.  Cardiovascular:     Rate and Rhythm:  Normal rate and regular rhythm.  Neurological:     General: No focal deficit present.     Mental Status: She is alert and oriented to person, place, and time. Mental status is at baseline.  Psychiatric:        Mood and Affect: Mood normal.        Behavior: Behavior normal.        Thought Content: Thought content normal.        Judgment: Judgment normal.     BP (!) 150/88   Pulse 90   Temp 97.9 F (36.6 C) (Temporal)   Resp 18   Ht 5\' 2"  (1.575 m)   Wt 173 lb 3.2 oz (78.6 kg)   SpO2 99%   BMI 31.68 kg/m  Wt Readings from Last 3 Encounters:  08/23/19 173 lb 3.2 oz (78.6 kg)  07/14/19 172 lb (78 kg)  06/07/19 175 lb (79.4 kg)    Health Maintenance Due  Topic Date Due  . URINE MICROALBUMIN  Never done    There  are no preventive care reminders to display for this patient.   Lab Results  Component Value Date   TSH 2.27 08/19/2019   Lab Results  Component Value Date   WBC CANCELED 06/07/2019   HGB 12.5 07/28/2018   HCT 39.8 07/28/2018   MCV 71 (L) 07/28/2018   PLT 358 07/28/2018   Lab Results  Component Value Date   NA 142 06/07/2019   K 4.6 06/07/2019   CO2 22 06/07/2019   GLUCOSE 117 (H) 06/07/2019   BUN 10 06/07/2019   CREATININE 0.61 06/07/2019   BILITOT 0.5 06/07/2019   ALKPHOS 35 (L) 06/07/2019   AST 20 06/07/2019   ALT 11 06/07/2019   PROT 7.4 06/07/2019   ALBUMIN 4.8 06/07/2019   CALCIUM 9.5 06/07/2019   ANIONGAP 6 09/04/2015   Lab Results  Component Value Date   CHOL 234 (H) 06/07/2019   Lab Results  Component Value Date   HDL 47 06/07/2019   Lab Results  Component Value Date   LDLCALC 149 (H) 06/07/2019   Lab Results  Component Value Date   TRIG 208 (H) 06/07/2019   Lab Results  Component Value Date   CHOLHDL 5.0 (H) 06/07/2019   No results found for: HGBA1C     Assessment & Plan:   Problem List Items Addressed This Visit    None    Visit Diagnoses    Missed period    -  Primary   Relevant Orders   POCT urine pregnancy (Completed)   Ambulatory referral to Obstetrics / Gynecology   Positive pregnancy test       Relevant Orders   Ambulatory referral to Obstetrics / Gynecology       No orders of the defined types were placed in this encounter.  PLAN  Briefly reviewed risks of pregnancy given her age and past complications  Will refer to obgyn for further consult  Return prn  Patient encouraged to call clinic with any questions, comments, or concerns.  06/09/2019, NP

## 2019-09-01 ENCOUNTER — Encounter: Payer: Self-pay | Admitting: Registered Nurse

## 2019-09-29 ENCOUNTER — Telehealth (INDEPENDENT_AMBULATORY_CARE_PROVIDER_SITE_OTHER): Payer: No Typology Code available for payment source | Admitting: *Deleted

## 2019-09-29 ENCOUNTER — Other Ambulatory Visit: Payer: Self-pay

## 2019-09-29 DIAGNOSIS — O0990 Supervision of high risk pregnancy, unspecified, unspecified trimester: Secondary | ICD-10-CM

## 2019-09-29 NOTE — Progress Notes (Signed)
1:25 Tammy Byrd not connected virtually ; I called her and she is not able to connect virtually. She states she can talk on the phone. She informed me she feels she had a miscarriage in mid July- that she bled heavy for several days. She states she called and was told to go to hospital but she did not go because she felt she did not need to go.  I informed her we do recommend follow up . I discussed with Dr.Newton and informed her we recommend hormone level bloodwork and then follow up will depend on that result. She is agreeable to that plan. I explained registrar will call her with appointment. She would like appointment to be 3:30 or later on a Thursday or 11am or later on Friday. Butch Otterson,RN

## 2019-09-29 NOTE — Progress Notes (Signed)
Attestation of Attending Supervision of clinical support staff: I agree with the care provided to this patient and was available for any consultation.  I have reviewed the RN's note and chart. I was consulted and documentation reflects my recommendation.   Federico Flake, MD, MPH, ABFM Attending Physician Faculty [facility]- [facility]

## 2019-09-30 ENCOUNTER — Other Ambulatory Visit: Payer: Self-pay

## 2019-09-30 ENCOUNTER — Encounter: Payer: Self-pay | Admitting: Registered Nurse

## 2019-09-30 ENCOUNTER — Telehealth (INDEPENDENT_AMBULATORY_CARE_PROVIDER_SITE_OTHER): Payer: No Typology Code available for payment source | Admitting: Registered Nurse

## 2019-09-30 DIAGNOSIS — Z7689 Persons encountering health services in other specified circumstances: Secondary | ICD-10-CM

## 2019-09-30 DIAGNOSIS — K649 Unspecified hemorrhoids: Secondary | ICD-10-CM

## 2019-09-30 MED ORDER — HYDROCORTISONE 1 % EX LOTN: 1.0000 "application " | TOPICAL_LOTION | Freq: Two times a day (BID) | CUTANEOUS | 0 refills | Status: DC

## 2019-09-30 NOTE — Patient Instructions (Signed)
° ° ° °  If you have lab work done today you will be contacted with your lab results within the next 2 weeks.  If you have not heard from us then please contact us. The fastest way to get your results is to register for My Chart. ° ° °IF you received an x-ray today, you will receive an invoice from [facility]. Please contact [facility] at [phone] with questions or concerns regarding your invoice.  ° °IF you received labwork today, you will receive an invoice from [facility]. Please contact [facility] at [phone] with questions or concerns regarding your invoice.  ° °Our billing staff will not be able to assist you with questions regarding bills from these companies. ° °You will be contacted with the lab results as soon as they are available. The fastest way to get your results is to activate your My Chart account. Instructions are located on the last page of this paperwork. If you have not heard from us regarding the results in 2 weeks, please contact this office. °  ° ° ° °

## 2019-10-03 ENCOUNTER — Other Ambulatory Visit: Payer: Self-pay | Admitting: *Deleted

## 2019-10-03 DIAGNOSIS — O4690 Antepartum hemorrhage, unspecified, unspecified trimester: Secondary | ICD-10-CM

## 2019-10-06 ENCOUNTER — Other Ambulatory Visit: Payer: No Typology Code available for payment source

## 2019-10-07 ENCOUNTER — Other Ambulatory Visit: Payer: No Typology Code available for payment source

## 2019-10-07 ENCOUNTER — Other Ambulatory Visit: Payer: Self-pay

## 2019-10-07 DIAGNOSIS — O4690 Antepartum hemorrhage, unspecified, unspecified trimester: Secondary | ICD-10-CM

## 2019-10-08 LAB — BETA HCG QUANT (REF LAB): hCG Quant: <1 m[IU]/mL

## 2019-10-10 ENCOUNTER — Telehealth: Payer: Self-pay | Admitting: *Deleted

## 2019-10-10 ENCOUNTER — Encounter: Payer: No Typology Code available for payment source | Admitting: Certified Nurse Midwife

## 2019-10-10 NOTE — Telephone Encounter (Addendum)
-----   Message from Federico Flake, MD sent at 10/10/2019  8:09 AM EDT ----- Beta HCG consistent with completed miscarriage  8/23  1135  Called pt and informed her of test results showing complete miscarriage. No further lab tests are needed. Pt does not want birth control at this time. She was advised that we recommend she not get pregnant until after one normal period. Pt voiced understanding of all information given.

## 2019-11-07 ENCOUNTER — Encounter: Payer: Self-pay | Admitting: Registered Nurse

## 2019-11-07 ENCOUNTER — Ambulatory Visit: Payer: No Typology Code available for payment source | Admitting: Registered Nurse

## 2019-11-07 ENCOUNTER — Other Ambulatory Visit: Payer: Self-pay

## 2019-11-07 VITALS — BP 136/88 | HR 82 | Temp 98.2°F | Resp 18 | Ht 62.0 in | Wt 170.4 lb

## 2019-11-07 DIAGNOSIS — E0500 Thyrotoxicosis with diffuse goiter without thyrotoxic crisis or storm: Secondary | ICD-10-CM | POA: Diagnosis not present

## 2019-11-07 DIAGNOSIS — R42 Dizziness and giddiness: Secondary | ICD-10-CM

## 2019-11-07 NOTE — Patient Instructions (Signed)
° ° ° °  If you have lab work done today you will be contacted with your lab results within the next 2 weeks.  If you have not heard from us then please contact us. The fastest way to get your results is to register for My Chart. ° ° °IF you received an x-ray today, you will receive an invoice from [facility]. Please contact [facility] at [phone] with questions or concerns regarding your invoice.  ° °IF you received labwork today, you will receive an invoice from [facility]. Please contact [facility] at [phone] with questions or concerns regarding your invoice.  ° °Our billing staff will not be able to assist you with questions regarding bills from these companies. ° °You will be contacted with the lab results as soon as they are available. The fastest way to get your results is to activate your My Chart account. Instructions are located on the last page of this paperwork. If you have not heard from us regarding the results in 2 weeks, please contact this office. °  ° ° ° °

## 2019-11-07 NOTE — Progress Notes (Signed)
Acute Office Visit  Subjective:    Patient ID: Tammy Byrd, female    DOB: [birth year], 39 y.o.   MRN: [id number]  Chief Complaint  Patient presents with  . Dizziness    patient states over the last month when she wakes up and stand it feels like her head is heavy and feels like shes going to fall down.    HPI Patient is in today for lightheadedness and dizziness  Over the past month notes that she has felt lightheaded and swimming feeling in the head when quickly changing positions No change to diet or exercise - trying to eat right and exercise regularly Does note that she unfortunately miscarried her pregnancy that she had confirmed in our office in early July. Had a few days of very heavy bleeding Does have a hx of Graves - f/u scheduled for this Friday.  Past Medical History:  Diagnosis Date  . 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 GDM (gestational diabetes mellitus)   . History of severe pre-eclampsia     Past Surgical History:  Procedure Laterality Date  . CESAREAN SECTION N/A 09/04/2015   Procedure: CESAREAN SECTION;  Surgeon: Levie Heritage, DO;  Location: [facility] BIRTHING SUITES;  Service: Obstetrics;  Laterality: N/A;  . NO PAST SURGERIES      Family History  Problem Relation Age of Onset  . Kidney disease Mother     Social History   Socioeconomic History  . Marital status: Single    Spouse name: Not on file  . Number of children: 3  . Years of education: Not on file  . Highest education level: Not on file  Occupational History  . Not on file  Tobacco Use  . Smoking status: Never Smoker  . Smokeless tobacco: Never Used  Vaping Use  . Vaping Use: Never used  Substance and Sexual Activity  . Alcohol use: No  . Drug use: No  . Sexual activity: Not Currently  Other Topics Concern  . Not on file  Social History Narrative  . Not on file   Social Determinants of Health   Financial Resource Strain:   . Difficulty of Paying Living Expenses: Not on  file  Food Insecurity:   . Worried About Programme researcher, broadcasting/film/video in the Last Year: Not on file  . Ran Out of Food in the Last Year: Not on file  Transportation Needs:   . Lack of Transportation (Medical): Not on file  . Lack of Transportation (Non-Medical): Not on file  Physical Activity:   . Days of Exercise per Week: Not on file  . Minutes of Exercise per Session: Not on file  Stress:   . Feeling of Stress : Not on file  Social Connections:   . Frequency of Communication with Friends and Family: Not on file  . Frequency of Social Gatherings with Friends and Family: Not on file  . Attends Religious Services: Not on file  . Active Member of Clubs or Organizations: Not on file  . Attends Banker Meetings: Not on file  . Marital Status: Not on file  Intimate Partner Violence:   . Fear of Current or Ex-Partner: Not on file  . Emotionally Abused: Not on file  . Physically Abused: Not on file  . Sexually Abused: Not on file    Outpatient Medications Prior to Visit  Medication Sig Dispense Refill  . hydrocortisone 1 % lotion Apply 1 application topically 2 (two) times daily. 118 mL 0  No facility-administered medications prior to visit.    Allergies  Allergen Reactions  . Latex     BUMPS AND SWELLING    Review of Systems Per hpi      Objective:    Physical Exam Vitals and nursing note reviewed.  Constitutional:      General: She is not in acute distress.    Appearance: Normal appearance. She is obese. She is not ill-appearing, toxic-appearing or diaphoretic.  Cardiovascular:     Rate and Rhythm: Normal rate and regular rhythm.     Heart sounds: Normal heart sounds. No murmur heard.  No friction rub. No gallop.   Pulmonary:     Effort: Pulmonary effort is normal. No respiratory distress.     Breath sounds: Normal breath sounds. No stridor. No wheezing, rhonchi or rales.  Chest:     Chest wall: No tenderness.  Musculoskeletal:        General: Normal range  of motion.  Skin:    General: Skin is warm and dry.     Capillary Refill: Capillary refill takes less than 2 seconds.  Neurological:     General: No focal deficit present.     Mental Status: She is alert and oriented to person, place, and time. Mental status is at baseline.     Cranial Nerves: No cranial nerve deficit.     Sensory: No sensory deficit.     Motor: No weakness.     Coordination: Coordination normal.     Gait: Gait normal.     Deep Tendon Reflexes: Reflexes normal.  Psychiatric:        Mood and Affect: Mood normal.        Behavior: Behavior normal.        Thought Content: Thought content normal.        Judgment: Judgment normal.     BP 136/88   Pulse 82   Temp 98.2 F (36.8 C) (Temporal)   Resp 18   Ht 5\' 2"  (1.575 m)   Wt 170 lb 6.4 oz (77.3 kg)   SpO2 96%   BMI 31.17 kg/m  Wt Readings from Last 3 Encounters:  11/07/19 170 lb 6.4 oz (77.3 kg)  08/23/19 173 lb 3.2 oz (78.6 kg)  07/14/19 172 lb (78 kg)    Health Maintenance Due  Topic Date Due  . HEMOGLOBIN A1C  Never done    There are no preventive care reminders to display for this patient.   Lab Results  Component Value Date   TSH 2.27 08/19/2019   Lab Results  Component Value Date   WBC CANCELED 06/07/2019   HGB 12.5 07/28/2018   HCT 39.8 07/28/2018   MCV 71 (L) 07/28/2018   PLT 358 07/28/2018   Lab Results  Component Value Date   NA 142 06/07/2019   K 4.6 06/07/2019   CO2 22 06/07/2019   GLUCOSE 117 (H) 06/07/2019   BUN 10 06/07/2019   CREATININE 0.61 06/07/2019   BILITOT 0.5 06/07/2019   ALKPHOS 35 (L) 06/07/2019   AST 20 06/07/2019   ALT 11 06/07/2019   PROT 7.4 06/07/2019   ALBUMIN 4.8 06/07/2019   CALCIUM 9.5 06/07/2019   ANIONGAP 6 09/04/2015   Lab Results  Component Value Date   CHOL 234 (H) 06/07/2019   Lab Results  Component Value Date   HDL 47 06/07/2019   Lab Results  Component Value Date   LDLCALC 149 (H) 06/07/2019   Lab Results  Component Value Date  TRIG 208 (H) 06/07/2019   Lab Results  Component Value Date   CHOLHDL 5.0 (H) 06/07/2019   No results found for: HGBA1C     Assessment & Plan:   Problem List Items Addressed This Visit      Endocrine   Graves disease - Primary (Chronic)   Relevant Orders   T3, Free   T4, Free   TSH     Other   Dizzinesses   Relevant Orders   Hemoglobin A1c   CBC With Differential   Human Chorionic Gonadotropin (hCG),Quantitative (Serial Monitor)   Comprehensive metabolic panel       No orders of the defined types were placed in this encounter.  PLAN  Orthostatics stable  Exam unremarkable  Will draw labs - suspect thyroid, anemia, or pregnancy  Patient encouraged to call clinic with any questions, comments, or concerns.  Janeece Agee, NP

## 2019-11-08 ENCOUNTER — Encounter: Payer: Self-pay | Admitting: Registered Nurse

## 2019-11-08 DIAGNOSIS — D509 Iron deficiency anemia, unspecified: Secondary | ICD-10-CM

## 2019-11-08 LAB — CBC WITH DIFFERENTIAL
Basophils Absolute: 0 10*3/uL (ref 0.0–0.2)
Basos: 0 %
EOS (ABSOLUTE): 0.1 10*3/uL (ref 0.0–0.4)
Eos: 1 %
Hematocrit: 39.7 % (ref 34.0–46.6)
Hemoglobin: 12.3 g/dL (ref 11.1–15.9)
Immature Grans (Abs): 0 10*3/uL (ref 0.0–0.1)
Immature Granulocytes: 0 %
Lymphocytes Absolute: 2.9 10*3/uL (ref 0.7–3.1)
Lymphs: 39 %
MCH: 22.4 pg — ABNORMAL LOW (ref 26.6–33.0)
MCHC: 31 g/dL — ABNORMAL LOW (ref 31.5–35.7)
MCV: 72 fL — ABNORMAL LOW (ref 79–97)
Monocytes Absolute: 0.3 10*3/uL (ref 0.1–0.9)
Monocytes: 5 %
Neutrophils Absolute: 4.1 10*3/uL (ref 1.4–7.0)
Neutrophils: 55 %
RBC: 5.48 x10E6/uL — ABNORMAL HIGH (ref 3.77–5.28)
RDW: 15.3 % (ref 11.7–15.4)
WBC: 7.5 10*3/uL (ref 3.4–10.8)

## 2019-11-08 LAB — COMPREHENSIVE METABOLIC PANEL
ALT: 10 IU/L (ref 0–32)
AST: 17 IU/L (ref 0–40)
Albumin/Globulin Ratio: 1.6 (ref 1.2–2.2)
Albumin: 4.6 g/dL (ref 3.8–4.8)
Alkaline Phosphatase: 39 IU/L — ABNORMAL LOW (ref 44–121)
BUN/Creatinine Ratio: 22 (ref 9–23)
BUN: 13 mg/dL (ref 6–20)
Bilirubin Total: 0.5 mg/dL (ref 0.0–1.2)
CO2: 22 mmol/L (ref 20–29)
Calcium: 9.8 mg/dL (ref 8.7–10.2)
Chloride: 100 mmol/L (ref 96–106)
Creatinine, Ser: 0.59 mg/dL (ref 0.57–1.00)
GFR calc Af Amer: 134 mL/min/{1.73_m2} (ref >59)
GFR calc non Af Amer: 116 mL/min/{1.73_m2} (ref >59)
Globulin, Total: 2.8 g/dL (ref 1.5–4.5)
Glucose: 87 mg/dL (ref 65–99)
Potassium: 4.7 mmol/L (ref 3.5–5.2)
Sodium: 138 mmol/L (ref 134–144)
Total Protein: 7.4 g/dL (ref 6.0–8.5)

## 2019-11-08 LAB — T3, FREE: T3, Free: 2.7 pg/mL (ref 2.0–4.4)

## 2019-11-08 LAB — HEMOGLOBIN A1C
Est. average glucose Bld gHb Est-mCnc: 126 mg/dL
Hgb A1c MFr Bld: 6 % — ABNORMAL HIGH (ref 4.8–5.6)

## 2019-11-08 LAB — T4, FREE: Free T4: 1.23 ng/dL (ref 0.82–1.77)

## 2019-11-08 LAB — HUMAN CHORIONIC GONADOTROPIN(HCG),B-SUBUNIT,QUANTITATIVE): HCG, Beta Chain, Quant, S: <1 m[IU]/mL

## 2019-11-08 LAB — TSH: TSH: 2.01 u[IU]/mL (ref 0.450–4.500)

## 2019-11-08 MED ORDER — FERROUS GLUCONATE 324 (38 FE) MG PO TABS: 324.0000 mg | ORAL_TABLET | Freq: Every day | ORAL | 3 refills | Status: AC

## 2019-11-11 ENCOUNTER — Other Ambulatory Visit: Payer: Self-pay

## 2019-11-11 ENCOUNTER — Other Ambulatory Visit (INDEPENDENT_AMBULATORY_CARE_PROVIDER_SITE_OTHER): Payer: No Typology Code available for payment source

## 2019-11-11 DIAGNOSIS — E0500 Thyrotoxicosis with diffuse goiter without thyrotoxic crisis or storm: Secondary | ICD-10-CM

## 2019-11-14 LAB — T4, FREE: Free T4: 0.96 ng/dL (ref 0.60–1.60)

## 2019-11-14 LAB — TSH: TSH: 1.55 u[IU]/mL (ref 0.35–4.50)

## 2019-11-14 LAB — T3, FREE: T3, Free: 3.1 pg/mL (ref 2.3–4.2)

## 2019-12-18 ENCOUNTER — Encounter: Payer: Self-pay | Admitting: Registered Nurse

## 2019-12-18 NOTE — Progress Notes (Signed)
Established Patient Office Visit  Subjective:  Patient ID: Tammy Byrd, female    DOB: [birth year]  Age: 39 y.o. MRN: [id number]  CC:  Chief Complaint  Patient presents with  . Transitions Of Care    Patient states she is here for a Transfer of care. She has no other questions or concerns at this time. Per patient she still have the appointment for the OBGYN , but she lost the baby.     HPI Tammy Byrd presents for visit to est care.  Notes that after we saw her for pregnancy confirmation she unfortunately had a miscarriage. Coping well. Good support at home. No concerns. Will keep appt with OBGYN.   Does note recent flare in hemorrhoids. Some brbpr. No tenesmus or hesitancy. No melena. Has happened in the past.   No other concerns at this time.   Taking iron supplement for hx of anemia  Histories reviewed with patient and updated as warranted   Past Medical History:  Diagnosis Date  . 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 GDM (gestational diabetes mellitus)   . History of severe pre-eclampsia     Past Surgical History:  Procedure Laterality Date  . CESAREAN SECTION N/A 09/04/2015   Procedure: CESAREAN SECTION;  Surgeon: Levie Heritage, DO;  Location: [facility] BIRTHING SUITES;  Service: Obstetrics;  Laterality: N/A;  . NO PAST SURGERIES      Family History  Problem Relation Age of Onset  . Kidney disease Mother     Social History   Socioeconomic History  . Marital status: Single    Spouse name: Not on file  . Number of children: 3  . Years of education: Not on file  . Highest education level: Not on file  Occupational History  . Not on file  Tobacco Use  . Smoking status: Never Smoker  . Smokeless tobacco: Never Used  Vaping Use  . Vaping Use: Never used  Substance and Sexual Activity  . Alcohol use: No  . Drug use: No  . Sexual activity: Not Currently  Other Topics Concern  . Not on file  Social History Narrative  . Not on file   Social Determinants of  Health   Financial Resource Strain:   . Difficulty of Paying Living Expenses: Not on file  Food Insecurity:   . Worried About Programme researcher, broadcasting/film/video in the Last Year: Not on file  . Ran Out of Food in the Last Year: Not on file  Transportation Needs:   . Lack of Transportation (Medical): Not on file  . Lack of Transportation (Non-Medical): Not on file  Physical Activity:   . Days of Exercise per Week: Not on file  . Minutes of Exercise per Session: Not on file  Stress:   . Feeling of Stress : Not on file  Social Connections:   . Frequency of Communication with Friends and Family: Not on file  . Frequency of Social Gatherings with Friends and Family: Not on file  . Attends Religious Services: Not on file  . Active Member of Clubs or Organizations: Not on file  . Attends Banker Meetings: Not on file  . Marital Status: Not on file  Intimate Partner Violence:   . Fear of Current or Ex-Partner: Not on file  . Emotionally Abused: Not on file  . Physically Abused: Not on file  . Sexually Abused: Not on file    No outpatient medications prior to visit.   No facility-administered medications  prior to visit.    Allergies  Allergen Reactions  . Latex     BUMPS AND SWELLING    ROS Review of Systems  Constitutional: Negative.   HENT: Negative.   Eyes: Negative.   Respiratory: Negative.   Cardiovascular: Negative.   Gastrointestinal: Negative.   Genitourinary: Negative.   Musculoskeletal: Negative.   Skin: Negative.   Neurological: Negative.   Psychiatric/Behavioral: Negative.       Objective:    Physical Exam Vitals and nursing note reviewed.  Constitutional:      General: She is not in acute distress.    Appearance: Normal appearance. She is normal weight. She is not ill-appearing, toxic-appearing or diaphoretic.  Cardiovascular:     Rate and Rhythm: Normal rate and regular rhythm.     Heart sounds: Normal heart sounds. No murmur heard.  No friction  rub. No gallop.   Pulmonary:     Effort: Pulmonary effort is normal. No respiratory distress.     Breath sounds: Normal breath sounds. No stridor. No wheezing, rhonchi or rales.  Chest:     Chest wall: No tenderness.  Skin:    General: Skin is warm and dry.  Neurological:     General: No focal deficit present.     Mental Status: She is alert and oriented to person, place, and time. Mental status is at baseline.  Psychiatric:        Mood and Affect: Mood normal.        Behavior: Behavior normal.        Thought Content: Thought content normal.        Judgment: Judgment normal.     There were no vitals taken for this visit. Wt Readings from Last 3 Encounters:  11/07/19 170 lb 6.4 oz (77.3 kg)  08/23/19 173 lb 3.2 oz (78.6 kg)  07/14/19 172 lb (78 kg)     Health Maintenance Due  Topic Date Due  . OPHTHALMOLOGY EXAM  Never done  . PAP SMEAR-Modifier  04/10/2018    There are no preventive care reminders to display for this patient.  Lab Results  Component Value Date   TSH 1.55 11/11/2019   Lab Results  Component Value Date   WBC 7.5 11/07/2019   HGB 12.3 11/07/2019   HCT 39.7 11/07/2019   MCV 72 (L) 11/07/2019   PLT 358 07/28/2018   Lab Results  Component Value Date   NA 138 11/07/2019   K 4.7 11/07/2019   CO2 22 11/07/2019   GLUCOSE 87 11/07/2019   BUN 13 11/07/2019   CREATININE 0.59 11/07/2019   BILITOT 0.5 11/07/2019   ALKPHOS 39 (L) 11/07/2019   AST 17 11/07/2019   ALT 10 11/07/2019   PROT 7.4 11/07/2019   ALBUMIN 4.6 11/07/2019   CALCIUM 9.8 11/07/2019   ANIONGAP 6 09/04/2015   Lab Results  Component Value Date   CHOL 234 (H) 06/07/2019   Lab Results  Component Value Date   HDL 47 06/07/2019   Lab Results  Component Value Date   LDLCALC 149 (H) 06/07/2019   Lab Results  Component Value Date   TRIG 208 (H) 06/07/2019   Lab Results  Component Value Date   CHOLHDL 5.0 (H) 06/07/2019   Lab Results  Component Value Date   HGBA1C 6.0 (H)  11/07/2019      Assessment & Plan:   Problem List Items Addressed This Visit    None    Visit Diagnoses    Hemorrhoids, unspecified hemorrhoid type    -  Primary   Relevant Medications   hydrocortisone 1 % lotion      Meds ordered this encounter  Medications  . hydrocortisone 1 % lotion    Sig: Apply 1 application topically 2 (two) times daily.    Dispense:  118 mL    Refill:  0    Order Specific Question:   Supervising Provider    Answer:   Neva Seat, JEFFREY R [[id number]]    Follow-up: No follow-ups on file.   PLAN  Hydrocortisone 1% for steroids - apply daily prn  Discussed nonpharm for hemorrhoids  Keep obgyn appt   Return annually for CPE and labs  Patient encouraged to call clinic with any questions, comments, or concerns.  Janeece Agee, NP

## 2020-01-20 ENCOUNTER — Encounter: Payer: Self-pay | Admitting: Internal Medicine

## 2020-01-20 ENCOUNTER — Other Ambulatory Visit: Payer: Self-pay

## 2020-01-20 ENCOUNTER — Ambulatory Visit (INDEPENDENT_AMBULATORY_CARE_PROVIDER_SITE_OTHER): Payer: No Typology Code available for payment source | Admitting: Internal Medicine

## 2020-01-20 VITALS — BP 140/82 | HR 85 | Ht 62.0 in | Wt 173.8 lb

## 2020-01-20 DIAGNOSIS — E0500 Thyrotoxicosis with diffuse goiter without thyrotoxic crisis or storm: Secondary | ICD-10-CM | POA: Diagnosis not present

## 2020-01-20 DIAGNOSIS — Z8632 Personal history of gestational diabetes: Secondary | ICD-10-CM | POA: Diagnosis not present

## 2020-01-20 LAB — T3, FREE: T3, Free: 3 pg/mL (ref 2.3–4.2)

## 2020-01-20 LAB — T4, FREE: Free T4: 0.91 ng/dL (ref 0.60–1.60)

## 2020-01-20 LAB — TSH: TSH: 1.52 u[IU]/mL (ref 0.35–4.50)

## 2020-01-20 NOTE — Patient Instructions (Addendum)
Please continue off methimazole for now.  Please stop at the lab.  Start a prenatal multivitamin at night.  Please come back for a follow-up appointment in 6 months.

## 2020-01-20 NOTE — Progress Notes (Signed)
Patient ID: Tammy Byrd, female   DOB: 01-11-1981, 39 y.o.   MRN: [id number]   This visit occurred during the SARS-CoV-2 public health emergency.  Safety protocols were in place, including screening questions prior to the visit, additional usage of staff PPE, and extensive cleaning of exam room while observing appropriate contact time as indicated for disinfecting solutions.   HPI  Tammy Byrd is a 39 y.o.-year-old female, returning for follow-up for Graves' disease.  Last visit 6 months ago.  1 week ago >> had a positive pregnancy test at the time of her wisdom teeth extractions.  She is feeling more tired and feels that she is more sensitive to smells but otherwise feels well.  Reviewed and addended history: She had 2 UTIs in 07/2016 and started to feel poorly afterwards.  She saw her PCP in 08/2016 for palpitations, fatigue, increased sweating.  TFTs were in the thyrotoxic range.  She was referred to endocrinology.  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 and 5 mg in p.m. in 11/2016.  We also started atenolol 25 mg twice a day but decrease the dose to only once a day in the past due to dizziness.  In 11/2017: we were able to decrease the methimazole to 5 mg twice a day.  We continued atenolol.  In 04/2018: we decreased the methimazole to 5 mg once a day.  She did not return for labs in 1.5 months, as advised.  Upon questioning, she was occasionally missing methimazole doses.  In 10/2018: We decrease methimazole to 2.5 mg daily  In 05/2019: We stopped methimazole completely.  She continues off the medication.  Reviewed her TFTs: Lab Results  Component Value Date   TSH 1.55 11/11/2019   TSH 2.010 11/07/2019   TSH 2.27 08/19/2019   TSH 1.56 07/14/2019   TSH 1.930 06/07/2019   TSH 2.63 11/05/2018    TSH 2.87 04/30/2018   TSH 1.60 12/11/2017   TSH 1.82 10/30/2017   TSH <0.01 Repeated and verified X2. (L) 04/24/2017   FREET4 0.96 11/11/2019   FREET4 1.23 11/07/2019   FREET4 1.00 08/19/2019   FREET4 0.96 07/14/2019   FREET4 1.28 06/07/2019   FREET4 1.04 11/05/2018   FREET4 0.82 04/30/2018   FREET4 0.85 12/11/2017   FREET4 0.83 10/30/2017   FREET4 0.94 04/24/2017    Pt denies: - feeling nodules in neck - hoarseness - dysphagia - choking - SOB with lying down  No family history of thyroid disease except thyroid cancer in mother.  No FH of thyroid cancer. No h/o radiation tx to head or neck.  No seaweed or kelp. No recent contrast studies. No herbal supplements. No Biotin use. No recent steroids use.   She is on an iron supplement.  She did not start prenatal multivitamins yet.  She has a history of HTN and GDM during previous pregnancy.  ROS: Constitutional: no weight gain/no weight loss, + fatigue, no subjective hyperthermia, no subjective hypothermia Eyes: no blurry vision, no xerophthalmia ENT: no sore throat, + see HPI Cardiovascular: no CP/no SOB/no palpitations/no leg swelling Respiratory: no cough/no SOB/no wheezing Gastrointestinal: no N/no V/no D/no C/no acid reflux Musculoskeletal: no muscle aches/no joint aches Skin: no rashes, no hair loss Neurological: no tremors/no numbness/no tingling/no dizziness  I reviewed pt's medications, allergies, PMH, social hx,  family hx, and changes were documented in the history of present illness. Otherwise, unchanged from my initial visit note.  Past Medical History:  Diagnosis Date  . 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 GDM (gestational diabetes mellitus)   . History of severe pre-eclampsia    Past Surgical History:  Procedure Laterality Date  . CESAREAN SECTION N/A 09/04/2015   Procedure: CESAREAN SECTION;  Surgeon: Levie Heritage, DO;  Location: [facility] BIRTHING SUITES;  Service: Obstetrics;  Laterality: N/A;   . NO PAST SURGERIES     Social History   Social History  . Marital status: Single,    Spouse name: N/A  . Number of children: 3   Occupational History  . Dental asstnt   Social History Main Topics  . Smoking status: Never Smoker  . Smokeless tobacco: Never Used  . Alcohol use No  . Drug use: No   Current Outpatient Medications  Medication Sig Dispense Refill  . ferrous gluconate (FERGON) 324 MG tablet Take 1 tablet (324 mg total) by mouth daily with breakfast. 90 tablet 3  . hydrocortisone 1 % lotion Apply 1 application topically 2 (two) times daily. 118 mL 0   No current facility-administered medications for this visit.   Allergies  Allergen Reactions  . Latex     BUMPS AND SWELLING   Family History  Problem Relation Age of Onset  . Kidney disease Mother    PE: BP 140/82   Pulse 85   Ht 5\' 2"  (1.575 m)   Wt 173 lb 12.8 oz (78.8 kg)   SpO2 98%   BMI 31.79 kg/m  Wt Readings from Last 3 Encounters:  01/20/20 173 lb 12.8 oz (78.8 kg)  11/07/19 170 lb 6.4 oz (77.3 kg)  08/23/19 173 lb 3.2 oz (78.6 kg)   Constitutional: overweight, in NAD Eyes: PERRLA, EOMI, no exophthalmos ENT: moist mucous membranes, + mild symmetric thyromegaly, no cervical lymphadenopathy Cardiovascular: RRR, No MRG Respiratory: CTA B Gastrointestinal: abdomen soft, NT, ND, BS+ Musculoskeletal: no deformities, strength intact in all 4 Skin: moist, warm, no rashes Neurological: no tremor with outstretched hands, DTR normal in all 4  ASSESSMENT: 1.  Graves' disease  2. H/o gestational diabetes  PLAN:  1. Patient with history of thyrotoxicosis (low TSH, high free thyroid hormones), with thyrotoxic symptoms: weight loss, heat intolerance, hyper defecation, palpitations, anxiety. TSI antibodies were elevated, pointing towards a diagnosis of Graves' disease.  We did not perform a thyroid uptake and scan.   -We initially started methimazole 10 mg in a.m. and 5 mg in the p.m. and atenolol 25  mg twice a day.  She developed dizziness on atenolol so we decreased the dose to 25 mg daily, which she tolerated well.  Afterwards, we could taper the atenolol to off.  She does not complain of any palpitations and her pulse is normal today -she is currently off methimazole since 05/2019.  She tolerated it well, without side effects. -Of note, she contacted me with unusable pregnancy this past summer, however when she had a miscarriage 09/2019. -At this visit, she just had a positive pregnancy test. -We did discuss about definitive treatment Graves' disease to include RAI treatment ablation or surgery, however, as she  was responding well to methimazole, we decided to continue this.  However, she is now off methimazole and we discussed that since she is in the first semester pregnancy, if TFTs are abnormal at today's visit, we need to start PTU, rather than methimazole.  In  the second trimester, we can switch back to methimazole if needed. -At this visit, she does not complain of thyrotoxic symptoms -Also, she does not have any signs of Graves' ophthalmopathy: double vision, blurry vision, eye pain, chemosis -We will recheck TFTs today along with a pregnancy test -I will see her back in 6 months, but likely sooner for labs.  2. H/o GDM - she had this with her last pregnancy, but she did not require insulin - last HbA1c was 6.0% 2 months ago -As of now, she does not have signs or symptoms of hypothyroidism -We discussed about having this repeated at her visit with OB/GYN-I advised her to schedule this as soon as possible -I may need to see her sooner if we need to start medications for her GDM.  For now, I did advise her to start a prenatal vitamin.  Component     Latest Ref Rng & Units 01/20/2020  TSH     0.35 - 4.50 uIU/mL 1.52  Triiodothyronine,Free,Serum     2.3 - 4.2 pg/mL 3.0  T4,Free(Direct)     0.60 - 1.60 ng/dL 7.01  hCG Quant     mIU/mL 64,547  Labs are normal.  No need to start  thionamides.  Pregnancy test is positive.  We'll plan to repeat the thyroid tests in another month to ensure stability.  Carlus Pavlov, MD PhD [facility]

## 2020-01-21 LAB — BETA HCG QUANT (REF LAB): hCG Quant: 64547 m[IU]/mL

## 2020-02-18 NOTE — L&D Delivery Note (Signed)
Delivery Note Called to bedside and patient complete and pushing. At 7:40 PM a viable female was delivered via Vaginal, Spontaneous (Presentation: Left Occiput Anterior).  Nuchal cord noted x1, delivered through. APGAR:9, 9 weight 3300g.   Placenta status: Spontaneous, Intact.  Cord: 3 vessels with the following complications: None.  Anesthesia: Epidural Episiotomy: None Lacerations: 2nd degree Suture Repair: 3.0 vicryl Est. Blood Loss (mL): 400  Mom to postpartum.  Baby to Couplet care / Skin to Skin.  Alric Seton 08/13/2020, 8:03 PM

## 2020-02-29 ENCOUNTER — Ambulatory Visit: Payer: No Typology Code available for payment source | Admitting: Registered Nurse

## 2020-02-29 ENCOUNTER — Other Ambulatory Visit: Payer: Self-pay

## 2020-02-29 ENCOUNTER — Encounter: Payer: Self-pay | Admitting: Registered Nurse

## 2020-02-29 VITALS — BP 153/86 | HR 103 | Temp 98.4°F | Ht 62.0 in | Wt 184.4 lb

## 2020-02-29 DIAGNOSIS — Z3201 Encounter for pregnancy test, result positive: Secondary | ICD-10-CM

## 2020-02-29 DIAGNOSIS — R202 Paresthesia of skin: Secondary | ICD-10-CM

## 2020-02-29 DIAGNOSIS — R059 Cough, unspecified: Secondary | ICD-10-CM | POA: Diagnosis not present

## 2020-02-29 LAB — POCT URINE PREGNANCY: Preg Test, Ur: POSITIVE — AB

## 2020-02-29 NOTE — Progress Notes (Signed)
Letter sent to pt via my-chart per pt request.

## 2020-02-29 NOTE — Patient Instructions (Signed)
° ° ° °  If you have lab work done today you will be contacted with your lab results within the next 2 weeks.  If you have not heard from us then please contact us. The fastest way to get your results is to register for My Chart. ° ° °IF you received an x-ray today, you will receive an invoice from [facility]. Please contact [facility] at [phone] with questions or concerns regarding your invoice.  ° °IF you received labwork today, you will receive an invoice from [facility]. Please contact [facility] at [phone] with questions or concerns regarding your invoice.  ° °Our billing staff will not be able to assist you with questions regarding bills from these companies. ° °You will be contacted with the lab results as soon as they are available. The fastest way to get your results is to activate your My Chart account. Instructions are located on the last page of this paperwork. If you have not heard from us regarding the results in 2 weeks, please contact this office. °  ° ° ° °

## 2020-03-01 LAB — CBC WITH DIFFERENTIAL
Basophils Absolute: 0 10*3/uL (ref 0.0–0.2)
Basos: 0 %
EOS (ABSOLUTE): 0.1 10*3/uL (ref 0.0–0.4)
Eos: 1 %
Hematocrit: 34.2 % (ref 34.0–46.6)
Hemoglobin: 10.8 g/dL — ABNORMAL LOW (ref 11.1–15.9)
Immature Grans (Abs): 0 10*3/uL (ref 0.0–0.1)
Immature Granulocytes: 0 %
Lymphocytes Absolute: 1.6 10*3/uL (ref 0.7–3.1)
Lymphs: 22 %
MCH: 22.6 pg — ABNORMAL LOW (ref 26.6–33.0)
MCHC: 31.6 g/dL (ref 31.5–35.7)
MCV: 72 fL — ABNORMAL LOW (ref 79–97)
Monocytes Absolute: 0.9 10*3/uL (ref 0.1–0.9)
Monocytes: 12 %
Neutrophils Absolute: 4.9 10*3/uL (ref 1.4–7.0)
Neutrophils: 65 %
RBC: 4.78 x10E6/uL (ref 3.77–5.28)
RDW: 16.5 % — ABNORMAL HIGH (ref 11.7–15.4)
WBC: 7.5 10*3/uL (ref 3.4–10.8)

## 2020-03-01 LAB — CMP14+EGFR
ALT: 5 IU/L (ref 0–32)
AST: 14 IU/L (ref 0–40)
Albumin/Globulin Ratio: 1.7 (ref 1.2–2.2)
Albumin: 4 g/dL (ref 3.8–4.8)
Alkaline Phosphatase: 30 IU/L — ABNORMAL LOW (ref 44–121)
BUN/Creatinine Ratio: 13 (ref 9–23)
BUN: 7 mg/dL (ref 6–24)
Bilirubin Total: <0.2 mg/dL (ref 0.0–1.2)
CO2: 20 mmol/L (ref 20–29)
Calcium: 8.7 mg/dL (ref 8.7–10.2)
Chloride: 103 mmol/L (ref 96–106)
Creatinine, Ser: 0.52 mg/dL — ABNORMAL LOW (ref 0.57–1.00)
GFR calc Af Amer: 138 mL/min/{1.73_m2} (ref >59)
GFR calc non Af Amer: 120 mL/min/{1.73_m2} (ref >59)
Globulin, Total: 2.4 g/dL (ref 1.5–4.5)
Glucose: 112 mg/dL — ABNORMAL HIGH (ref 65–99)
Potassium: 4.1 mmol/L (ref 3.5–5.2)
Sodium: 136 mmol/L (ref 134–144)
Total Protein: 6.4 g/dL (ref 6.0–8.5)

## 2020-03-01 LAB — VITAMIN D 25 HYDROXY (VIT D DEFICIENCY, FRACTURES): Vit D, 25-Hydroxy: 17.1 ng/mL — ABNORMAL LOW (ref 30.0–100.0)

## 2020-03-01 LAB — FOLATE: Folate: 18.2 ng/mL (ref >3.0)

## 2020-03-01 LAB — HEMOGLOBIN A1C
Est. average glucose Bld gHb Est-mCnc: 131 mg/dL
Hgb A1c MFr Bld: 6.2 % — ABNORMAL HIGH (ref 4.8–5.6)

## 2020-03-01 LAB — TSH: TSH: 0.514 u[IU]/mL (ref 0.450–4.500)

## 2020-03-03 LAB — NOVEL CORONAVIRUS, NAA: SARS-CoV-2, NAA: DETECTED — AB

## 2020-03-12 ENCOUNTER — Encounter: Payer: Self-pay | Admitting: Registered Nurse

## 2020-04-02 ENCOUNTER — Encounter: Payer: Self-pay | Admitting: Registered Nurse

## 2020-04-02 NOTE — Progress Notes (Signed)
Established Patient Office Visit  Subjective:  Patient ID: Tammy Byrd, female    DOB: 10/28/1980  Age: 40 y.o. MRN: [id number]  CC:  Chief Complaint  Patient presents with  . pos home preg test     Needs referral to OB     HPI Tammy Byrd presents for positive home preg test  Not expected but feels that this explains a number of symptoms- urinary symptoms, fatigue, missed menses.  Wants referral to obgyn  Has had intermittent dry cough No known exposure to covid No major concerns but would like a test just in case.  Past Medical History:  Diagnosis Date  . 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 GDM (gestational diabetes mellitus)   . History of severe pre-eclampsia     Past Surgical History:  Procedure Laterality Date  . CESAREAN SECTION N/A 09/04/2015   Procedure: CESAREAN SECTION;  Surgeon: Truett Mainland, DO;  Location: [facility];  Service: Obstetrics;  Laterality: N/A;  . NO PAST SURGERIES      Family History  Problem Relation Age of Onset  . Kidney disease Mother     Social History   Socioeconomic History  . Marital status: Single    Spouse name: Not on file  . Number of children: 3  . Years of education: Not on file  . Highest education level: Not on file  Occupational History  . Not on file  Tobacco Use  . Smoking status: Never Smoker  . Smokeless tobacco: Never Used  Vaping Use  . Vaping Use: Never used  Substance and Sexual Activity  . Alcohol use: No  . Drug use: No  . Sexual activity: Not Currently  Other Topics Concern  . Not on file  Social History Narrative  . Not on file   Social Determinants of Health   Financial Resource Strain: Not on file  Food Insecurity: Not on file  Transportation Needs: Not on file  Physical Activity: Not on file  Stress: Not on file  Social Connections: Not on file  Intimate Partner Violence: Not on file    Outpatient Medications Prior to Visit  Medication Sig Dispense Refill  . ferrous  gluconate (FERGON) 324 MG tablet Take 1 tablet (324 mg total) by mouth daily with breakfast. 90 tablet 3  . hydrocortisone 1 % lotion Apply 1 application topically 2 (two) times daily. 118 mL 0   No facility-administered medications prior to visit.    Allergies  Allergen Reactions  . Latex     BUMPS AND SWELLING    ROS Review of Systems  Constitutional: Negative.   HENT: Negative.   Eyes: Negative.   Respiratory: Negative.   Cardiovascular: Negative.   Gastrointestinal: Negative.   Genitourinary: Negative.   Musculoskeletal: Negative.   Skin: Negative.   Neurological: Negative.   Psychiatric/Behavioral: Negative.   All other systems reviewed and are negative.     Objective:    Physical Exam Vitals and nursing note reviewed.  Constitutional:      General: She is not in acute distress.    Appearance: Normal appearance. She is normal weight. She is not ill-appearing, toxic-appearing or diaphoretic.  Cardiovascular:     Rate and Rhythm: Normal rate and regular rhythm.     Heart sounds: Normal heart sounds. No murmur heard. No friction rub. No gallop.   Pulmonary:     Effort: Pulmonary effort is normal. No respiratory distress.     Breath sounds: Normal breath sounds. No stridor.  No wheezing, rhonchi or rales.  Chest:     Chest wall: No tenderness.  Skin:    General: Skin is warm and dry.  Neurological:     General: No focal deficit present.     Mental Status: She is alert and oriented to person, place, and time. Mental status is at baseline.  Psychiatric:        Mood and Affect: Mood normal.        Behavior: Behavior normal.        Thought Content: Thought content normal.        Judgment: Judgment normal.     BP (!) 153/86   Pulse (!) 103   Temp 98.4 F (36.9 C) (Temporal)   Ht 5' 2"  (1.575 m)   Wt 184 lb 6.4 oz (83.6 kg)   LMP 12/06/2019   SpO2 97%   BMI 33.73 kg/m  Wt Readings from Last 3 Encounters:  02/29/20 184 lb 6.4 oz (83.6 kg)  01/20/20 173  lb 12.8 oz (78.8 kg)  11/07/19 170 lb 6.4 oz (77.3 kg)     Health Maintenance Due  Topic Date Due  . OPHTHALMOLOGY EXAM  Never done  . PAP SMEAR-Modifier  04/10/2018  . COVID-19 Vaccine (3 - Booster for Pfizer series) 11/22/2019    There are no preventive care reminders to display for this patient.  Lab Results  Component Value Date   TSH 0.514 02/29/2020   Lab Results  Component Value Date   WBC 7.5 02/29/2020   HGB 10.8 (L) 02/29/2020   HCT 34.2 02/29/2020   MCV 72 (L) 02/29/2020   PLT 358 07/28/2018   Lab Results  Component Value Date   NA 136 02/29/2020   K 4.1 02/29/2020   CO2 20 02/29/2020   GLUCOSE 112 (H) 02/29/2020   BUN 7 02/29/2020   CREATININE 0.52 (L) 02/29/2020   BILITOT <0.2 02/29/2020   ALKPHOS 30 (L) 02/29/2020   AST 14 02/29/2020   ALT 5 02/29/2020   PROT 6.4 02/29/2020   ALBUMIN 4.0 02/29/2020   CALCIUM 8.7 02/29/2020   ANIONGAP 6 09/04/2015   Lab Results  Component Value Date   CHOL 234 (H) 06/07/2019   Lab Results  Component Value Date   HDL 47 06/07/2019   Lab Results  Component Value Date   LDLCALC 149 (H) 06/07/2019   Lab Results  Component Value Date   TRIG 208 (H) 06/07/2019   Lab Results  Component Value Date   CHOLHDL 5.0 (H) 06/07/2019   Lab Results  Component Value Date   HGBA1C 6.2 (H) 02/29/2020      Assessment & Plan:   Problem List Items Addressed This Visit   None   Visit Diagnoses    Positive pregnancy test    -  Primary   Relevant Orders   POCT urine pregnancy (Completed)   CBC With Differential (Completed)   CMP14+EGFR (Completed)   TSH (Completed)   Hemoglobin A1c (Completed)   Folate (Completed)   Vitamin D, 25-hydroxy (Completed)   Paresthesia       Relevant Orders   Folate (Completed)   Vitamin D, 25-hydroxy (Completed)   Cough       Relevant Orders   Novel Coronavirus, NAA ([facility]) (Completed)      No orders of the defined types were placed in this encounter.   Follow-up: No  follow-ups on file.   PLAN Labs collected. Will follow up with the patient as warranted. Refer to obgyn Follow up  prn Patient encouraged to call clinic with any questions, comments, or concerns.  Maximiano Coss, NP

## 2020-04-11 ENCOUNTER — Ambulatory Visit (INDEPENDENT_AMBULATORY_CARE_PROVIDER_SITE_OTHER): Payer: No Typology Code available for payment source | Admitting: Family Medicine

## 2020-04-11 ENCOUNTER — Other Ambulatory Visit (HOSPITAL_COMMUNITY)
Admission: RE | Admit: 2020-04-11 | Discharge: 2020-04-11 | Disposition: A | Discharge status: Home / Self Care | Payer: No Typology Code available for payment source | Source: Ambulatory Visit | Attending: Family Medicine | Admitting: Family Medicine

## 2020-04-11 ENCOUNTER — Other Ambulatory Visit: Payer: Self-pay

## 2020-04-11 ENCOUNTER — Encounter: Payer: Self-pay | Admitting: Family Medicine

## 2020-04-11 VITALS — BP 149/87 | HR 90 | Wt 184.0 lb

## 2020-04-11 DIAGNOSIS — Z8751 Personal history of pre-term labor: Secondary | ICD-10-CM

## 2020-04-11 DIAGNOSIS — D573 Sickle-cell trait: Secondary | ICD-10-CM

## 2020-04-11 DIAGNOSIS — E0500 Thyrotoxicosis with diffuse goiter without thyrotoxic crisis or storm: Secondary | ICD-10-CM

## 2020-04-11 DIAGNOSIS — D649 Anemia, unspecified: Secondary | ICD-10-CM

## 2020-04-11 DIAGNOSIS — Z98891 History of uterine scar from previous surgery: Secondary | ICD-10-CM

## 2020-04-11 DIAGNOSIS — O0990 Supervision of high risk pregnancy, unspecified, unspecified trimester: Secondary | ICD-10-CM | POA: Diagnosis present

## 2020-04-11 DIAGNOSIS — Z8632 Personal history of gestational diabetes: Secondary | ICD-10-CM

## 2020-04-11 DIAGNOSIS — Z8759 Personal history of other complications of pregnancy, childbirth and the puerperium: Secondary | ICD-10-CM

## 2020-04-11 DIAGNOSIS — O10919 Unspecified pre-existing hypertension complicating pregnancy, unspecified trimester: Secondary | ICD-10-CM

## 2020-04-11 DIAGNOSIS — D565 Hemoglobin E-beta thalassemia: Secondary | ICD-10-CM

## 2020-04-11 MED ORDER — ASPIRIN EC 81 MG PO TBEC: 81.0000 mg | DELAYED_RELEASE_TABLET | Freq: Every day | ORAL | 11 refills | Status: DC

## 2020-04-11 MED ORDER — NIFEDIPINE ER OSMOTIC RELEASE 30 MG PO TB24
30.0000 mg | ORAL_TABLET | Freq: Every day | ORAL | 9 refills | Status: DC
Start: 2020-04-11 — End: 2020-05-16

## 2020-04-11 NOTE — Progress Notes (Signed)
Subjective:   Tammy Byrd is a 40 y.o. [id number] at 6w1dby LMP with regular period prior to conceiving being seen today for her first obstetrical visit.  Her obstetrical history is significant for advanced maternal age, obesity, pregnancy induced hypertension, pre-eclampsia and GDM. Patient does intend to breast feed. Pregnancy history fully reviewed.  Patient reports no complaints.  HISTORY: OB History  Gravida Para Term Preterm AB Living  _0 0 3  SAB IAB Ectopic Multiple Live Births  0 0 0 0 3    # Outcome Date GA Lbr Len/2nd Weight Sex Delivery Anes PTL Lv  4 Current           3 Preterm 09/04/15 340w3d6 lb 7.2 oz (2.925 kg) F CS-LTranv EPI  LIV     Birth Comments: No gross anomalies seen. Small dimple on sacrum with intact base.     Name: KANAAMA, SAPPINGTON   Apgar1: 6  Apgar5: 9  2 Term 04/13/10    F Vag-Spont EPI N LIV  1 Preterm 04/09/07 3451w0dF Vag-Spont EPI  LIV   Last pap smear was  2017 and was normal Past Medical History:  Diagnosis Date  . 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 GDM (gestational diabetes mellitus)   . History of severe pre-eclampsia    Past Surgical History:  Procedure Laterality Date  . CESAREAN SECTION N/A 09/04/2015   Procedure: CESAREAN SECTION;  Surgeon: JacTruett MainlandO;  [facility];  Laterality: N/A;  . NO PAST SURGERIES     Family History  Problem Relation Age of Onset  . Kidney disease Mother    Social History   Tobacco Use  . Smoking status: Never Smoker  . Smokeless tobacco: Never Used  Vaping Use  . Vaping Use: Never used  Substance Use Topics  . Alcohol use: No  . Drug use: No   Allergies  Allergen Reactions  . Latex     BUMPS AND SWELLING   Current Outpatient Medications on File Prior to Visit  Medication Sig Dispense Refill  . ferrous gluconate (FERGON) 324 MG tablet Take 1 tablet (324 mg total) by mouth daily with breakfast. 90 tablet 3  . hydrocortisone 1 %  lotion Apply 1 application topically 2 (two) times daily. 118 mL 0  . Prenatal Vit-Fe Fumarate-FA (PRENATAL PO) Take by mouth.     No current facility-administered medications on file prior to visit.     Exam   Vitals:   04/11/20 1038 04/11/20 1051  BP: (!) 153/96 (!) 149/87  Pulse: 86 90  Weight: 184 lb (83.5 kg)    Fetal Heart Rate (bpm): 138  Uterus:     Pelvic Exam: Perineum: no hemorrhoids, normal perineum   Vulva: normal external genitalia, no lesions   Vagina:  normal mucosa, normal discharge   Cervix: no lesions and normal, pap smear done.   System: General: well-developed, well-nourished female in no acute distress   Skin: normal coloration and turgor, no rashes   Neurologic: oriented, normal, negative, normal mood   Extremities: normal strength, tone, and muscle mass, ROM of all joints is normal   HEENT PERRLA, extraocular movement intact and sclera clear, anicteric   Neck supple and no masses   Respiratory:  no respiratory distress     Assessment:   Pregnancy: [id number] Active Problem List   Diagnosis Date Noted  . Supervision of high risk  pregnancy, antepartum 04/11/2020  . Dizzinesses 11/07/2019  . Anemia 07/28/2018  . Graves disease 11/25/2016  . History of gestational diabetes 10/16/2015  . History of cesarean delivery 09/05/2015  . History of preterm delivery 09/04/2015  . History of severe pre-eclampsia 08/16/2015  . 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 Sickle cell trait ([place]) 04/14/2015     Plan:  1. Supervision of high risk pregnancy, antepartum BP elevated, see below Normal FHR Initial labs drawn. Pap collected Continue prenatal vitamins. Genetic Screening discussed, NIPS: ordered. Ultrasound discussed; fetal anatomic survey: ordered. Problem list reviewed and updated. The nature of [facility] with multiple MDs and other  Advanced Practice Providers was explained to patient; also emphasized that residents, students are part of our team. - CBC/D/Plt+RPR+Rh+ABO+Rub Ab... - GC/Chlamydia probe amp ([place])not at [facility] - Culture, OB Urine - Comp Met (CMET) - Protein / creatinine ratio, urine - AFP, Serum, Open Spina Bifida - aspirin EC 81 MG tablet; Take 1 tablet (81 mg total) by mouth daily. Swallow whole.  Dispense: 30 tablet; Refill: 11  2. Sickle cell trait ([place]) Reports husband has had genetic testing and is not a carrier  3. History of severe pre-eclampsia First and third pregnancies Start baby ASA  4. History of preterm delivery Iatrogenic secondary to pre-e  5. History of gestational diabetes A1c was 6.2% last month, has had GDM in multiple prior pregnancies Discussed with her that given she is at 18 weeks, has had GDM previously, and has a nearly diabetic A1c as of one month ago we will treat her as GDM from this point on Questionable as to whether or not she should have fetal echo given technically not meeting criteria for pregestational DM She will start to check sugars QID, referred to diabetic educator  6. Anemia, unspecified type   7. Graves disease Previously on methimazole, currently off meds Check TSH and T4 Follows w Endo  8. Hgb E-beta thalassemia ([facility]) Reports husband has had genetic testing and is not a carrier  9. History of cesarean delivery Last pregnancy for breech presentation Desires VBAC, discuss further at future visit  10. Chronic hypertension affecting pregnancy BP not at goal, start Nifedipine 61m XL Baseline labs today   Routine obstetric precautions reviewed. [facility] 4 weeks (on 05[facility]09/2020[facility]

## 2020-04-11 NOTE — Patient Instructions (Signed)
 Second Trimester of Pregnancy  The second trimester of pregnancy is from week 13 through week 27. This is months 4 through 6 of pregnancy. The second trimester is often a time when you feel your best. Your body has adjusted to being pregnant, and you begin to feel better physically. During the second trimester:  Morning sickness has lessened or stopped completely.  You may have more energy.  You may have an increase in appetite. The second trimester is also a time when the unborn baby (fetus) is growing rapidly. At the end of the sixth month, the fetus may be up to 12 inches long and weigh about 1 pounds. You will likely begin to feel the baby move (quickening) between 16 and 20 weeks of pregnancy. Body changes during your second trimester Your body continues to go through many changes during your second trimester. The changes vary and generally return to normal after the baby is born. Physical changes  Your weight will continue to increase. You will notice your lower abdomen bulging out.  You may begin to get stretch marks on your hips, abdomen, and breasts.  Your breasts will continue to grow and to become tender.  Dark spots or blotches (chloasma or mask of pregnancy) may develop on your face.  A dark line from your belly button to the pubic area (linea nigra) may appear.  You may have changes in your hair. These can include thickening of your hair, rapid growth, and changes in texture. Some people also have hair loss during or after pregnancy, or hair that feels dry or thin. Health changes  You may develop headaches.  You may have heartburn.  You may develop constipation.  You may develop hemorrhoids or swollen, bulging veins (varicose veins).  Your gums may bleed and may be sensitive to brushing and flossing.  You may urinate more often because the fetus is pressing on your bladder.  You may have back pain. This is caused by: ? Weight gain. ? Pregnancy hormones  that are relaxing the joints in your pelvis. ? A shift in weight and the muscles that support your balance. Follow these instructions at home: Medicines  Follow your health care provider's instructions regarding medicine use. Specific medicines may be either safe or unsafe to take during pregnancy. Do not take any medicines unless approved by your health care provider.  Take a prenatal vitamin that contains at least 600 micrograms (mcg) of folic acid. Eating and drinking  Eat a healthy diet that includes fresh fruits and vegetables, whole grains, good sources of protein such as meat, eggs, or tofu, and low-fat dairy products.  Avoid raw meat and unpasteurized juice, milk, and cheese. These carry germs that can harm you and your baby.  You may need to take these actions to prevent or treat constipation: ? Drink enough fluid to keep your urine pale yellow. ? Eat foods that are high in fiber, such as beans, whole grains, and fresh fruits and vegetables. ? Limit foods that are high in fat and processed sugars, such as fried or sweet foods. Activity  Exercise only as directed by your health care provider. Most people can continue their usual exercise routine during pregnancy. Try to exercise for 30 minutes at least 5 days a week. Stop exercising if you develop contractions in your uterus.  Stop exercising if you develop pain or cramping in the lower abdomen or lower back.  Avoid exercising if it is very hot or humid or if you are   at a high altitude.  Avoid heavy lifting.  If you choose to, you may have sex unless your health care provider tells you not to. Relieving pain and discomfort  Wear a supportive bra to prevent discomfort from breast tenderness.  Take warm sitz baths to soothe any pain or discomfort caused by hemorrhoids. Use hemorrhoid cream if your health care provider approves.  Rest with your legs raised (elevated) if you have leg cramps or low back pain.  If you develop  varicose veins: ? Wear support hose as told by your health care provider. ? Elevate your feet for 15 minutes, 3-4 times a day. ? Limit salt in your diet. Safety  Wear your seat belt at all times when driving or riding in a car.  Talk with your health care provider if someone is verbally or physically abusive to you. Lifestyle  Do not use hot tubs, steam rooms, or saunas.  Do not douche. Do not use tampons or scented sanitary pads.  Avoid cat litter boxes and soil used by cats. These carry germs that can cause birth defects in the baby and possibly loss of the fetus by miscarriage or stillbirth.  Do not use herbal remedies, alcohol, illegal drugs, or medicines that are not approved by your health care provider. Chemicals in these products can harm your baby.  Do not use any products that contain nicotine or tobacco, such as cigarettes, e-cigarettes, and chewing tobacco. If you need help quitting, ask your health care provider. General instructions  During a routine prenatal visit, your health care provider will do a physical exam and other tests. He or she will also discuss your overall health. Keep all follow-up visits. This is important.  Ask your health care provider for a referral to a local prenatal education class.  Ask for help if you have counseling or nutritional needs during pregnancy. Your health care provider can offer advice or refer you to specialists for help with various needs. Where to find more information  American Pregnancy Association: americanpregnancy.org  American College of Obstetricians and Gynecologists: acog.org/en/Womens%20Health/Pregnancy  Office on Women's Health: womenshealth.gov/pregnancy Contact a health care provider if you have:  A headache that does not go away when you take medicine.  Vision changes or you see spots in front of your eyes.  Mild pelvic cramps, pelvic pressure, or nagging pain in the abdominal area.  Persistent nausea,  vomiting, or diarrhea.  A bad-smelling vaginal discharge or foul-smelling urine.  Pain when you urinate.  Sudden or extreme swelling of your face, hands, ankles, feet, or legs.  A fever. Get help right away if you:  Have fluid leaking from your vagina.  Have spotting or bleeding from your vagina.  Have severe abdominal cramping or pain.  Have difficulty breathing.  Have chest pain.  Have fainting spells.  Have not felt your baby move for the time period told by your health care provider.  Have new or increased pain, swelling, or redness in an arm or leg. Summary  The second trimester of pregnancy is from week 13 through week 27 (months 4 through 6).  Do not use herbal remedies, alcohol, illegal drugs, or medicines that are not approved by your health care provider. Chemicals in these products can harm your baby.  Exercise only as directed by your health care provider. Most people can continue their usual exercise routine during pregnancy.  Keep all follow-up visits. This is important. This information is not intended to replace advice given to you by   your health care provider. Make sure you discuss any questions you have with your health care provider. Document Revised: 07/13/2019 Document Reviewed: 05/19/2019 Elsevier Patient Education  2021 [employer]   Contraception Choices Contraception, also called birth control, refers to methods or devices that prevent pregnancy. Hormonal methods Contraceptive implant A contraceptive implant is a thin, plastic tube that contains a hormone that prevents pregnancy. It is different from an intrauterine device (IUD). It is inserted into the upper part of the arm by a health care provider. Implants can be effective for up to 3 years. Progestin-only injections Progestin-only injections are injections of progestin, a synthetic form of the hormone progesterone. They are given every 3 months by a health care provider. Birth control  pills Birth control pills are pills that contain hormones that prevent pregnancy. They must be taken once a day, preferably at the same time each day. A prescription is needed to use this method of contraception. Birth control patch The birth control patch contains hormones that prevent pregnancy. It is placed on the skin and must be changed once a week for three weeks and removed on the fourth week. A prescription is needed to use this method of contraception. Vaginal ring A vaginal ring contains hormones that prevent pregnancy. It is placed in the vagina for three weeks and removed on the fourth week. After that, the process is repeated with a new ring. A prescription is needed to use this method of contraception. Emergency contraceptive Emergency contraceptives prevent pregnancy after unprotected sex. They come in pill form and can be taken up to 5 days after sex. They work best the sooner they are taken after having sex. Most emergency contraceptives are available without a prescription. This method should not be used as your only form of birth control.   Barrier methods Female condom A female condom is a thin sheath that is worn over the penis during sex. Condoms keep sperm from going inside a woman's body. They can be used with a sperm-killing substance (spermicide) to increase their effectiveness. They should be thrown away after one use. Female condom A female condom is a soft, loose-fitting sheath that is put into the vagina before sex. The condom keeps sperm from going inside a woman's body. They should be thrown away after one use. Diaphragm A diaphragm is a soft, dome-shaped barrier. It is inserted into the vagina before sex, along with a spermicide. The diaphragm blocks sperm from entering the uterus, and the spermicide kills sperm. A diaphragm should be left in the vagina for 6-8 hours after sex and removed within 24 hours. A diaphragm is prescribed and fitted by a health care provider. A  diaphragm should be replaced every 1-2 years, after giving birth, after gaining more than 15 lb (6.8 kg), and after pelvic surgery. Cervical cap A cervical cap is a round, soft latex or plastic cup that fits over the cervix. It is inserted into the vagina before sex, along with spermicide. It blocks sperm from entering the uterus. The cap should be left in place for 6-8 hours after sex and removed within 48 hours. A cervical cap must be prescribed and fitted by a health care provider. It should be replaced every 2 years. Sponge A sponge is a soft, circular piece of polyurethane foam with spermicide in it. The sponge helps block sperm from entering the uterus, and the spermicide kills sperm. To use it, you make it wet and then insert it into the vagina. It should be   inserted before sex, left in for at least 6 hours after sex, and removed and thrown away within 30 hours. Spermicides Spermicides are chemicals that kill or block sperm from entering the cervix and uterus. They can come as a cream, jelly, suppository, foam, or tablet. A spermicide should be inserted into the vagina with an applicator at least 10-15 minutes before sex to allow time for it to work. The process must be repeated every time you have sex. Spermicides do not require a prescription.   Intrauterine contraception Intrauterine device (IUD) An IUD is a T-shaped device that is put in a woman's uterus. There are two types:  Hormone IUD.This type contains progestin, a synthetic form of the hormone progesterone. This type can stay in place for 3-5 years.  Copper IUD.This type is wrapped in copper wire. It can stay in place for 10 years. Permanent methods of contraception Female tubal ligation In this method, a woman's fallopian tubes are sealed, tied, or blocked during surgery to prevent eggs from traveling to the uterus. Hysteroscopic sterilization In this method, a small, flexible insert is placed into each fallopian tube. The inserts  cause scar tissue to form in the fallopian tubes and block them, so sperm cannot reach an egg. The procedure takes about 3 months to be effective. Another form of birth control must be used during those 3 months. Female sterilization This is a procedure to tie off the tubes that carry sperm (vasectomy). After the procedure, the man can still ejaculate fluid (semen). Another form of birth control must be used for 3 months after the procedure. Natural planning methods Natural family planning In this method, a couple does not have sex on days when the woman could become pregnant. Calendar method In this method, the woman keeps track of the length of each menstrual cycle, identifies the days when pregnancy can happen, and does not have sex on those days. Ovulation method In this method, a couple avoids sex during ovulation. Symptothermal method This method involves not having sex during ovulation. The woman typically checks for ovulation by watching changes in her temperature and in the consistency of cervical mucus. Post-ovulation method In this method, a couple waits to have sex until after ovulation. Where to find more information  Centers for Disease Control and Prevention: www.cdc.gov Summary  Contraception, also called birth control, refers to methods or devices that prevent pregnancy.  Hormonal methods of contraception include implants, injections, pills, patches, vaginal rings, and emergency contraceptives.  Barrier methods of contraception can include female condoms, female condoms, diaphragms, cervical caps, sponges, and spermicides.  There are two types of IUDs (intrauterine devices). An IUD can be put in a woman's uterus to prevent pregnancy for 3-5 years.  Permanent sterilization can be done through a procedure for males and females. Natural family planning methods involve nothaving sex on days when the woman could become pregnant. This information is not intended to replace advice  given to you by your health care provider. Make sure you discuss any questions you have with your health care provider. Document Revised: 07/11/2019 Document Reviewed: 07/11/2019 Elsevier Patient Education  2021 [employer] Inc.   Breastfeeding  Choosing to breastfeed is one of the best decisions you can make for yourself and your baby. A change in hormones during pregnancy causes your breasts to make breast milk in your milk-producing glands. Hormones prevent breast milk from being released before your baby is born. They also prompt milk flow after birth. Once breastfeeding has begun, thoughts of   your baby, as well as his or her sucking or crying, can stimulate the release of milk from your milk-producing glands. Benefits of breastfeeding Research shows that breastfeeding offers many health benefits for infants and mothers. It also offers a cost-free and convenient way to feed your baby. For your baby  Your first milk (colostrum) helps your baby's digestive system to function better.  Special cells in your milk (antibodies) help your baby to fight off infections.  Breastfed babies are less likely to develop asthma, allergies, obesity, or type 2 diabetes. They are also at lower risk for sudden infant death syndrome (SIDS).  Nutrients in breast milk are better able to meet your baby's needs compared to infant formula.  Breast milk improves your baby's brain development. For you  Breastfeeding helps to create a very special bond between you and your baby.  Breastfeeding is convenient. Breast milk costs nothing and is always available at the correct temperature.  Breastfeeding helps to burn calories. It helps you to lose the weight that you gained during pregnancy.  Breastfeeding makes your uterus return faster to its size before pregnancy. It also slows bleeding (lochia) after you give birth.  Breastfeeding helps to lower your risk of developing type 2 diabetes, osteoporosis, rheumatoid  arthritis, cardiovascular disease, and breast, ovarian, uterine, and endometrial cancer later in life. Breastfeeding basics Starting breastfeeding  Find a comfortable place to sit or lie down, with your neck and back well-supported.  Place a pillow or a rolled-up blanket under your baby to bring him or her to the level of your breast (if you are seated). Nursing pillows are specially designed to help support your arms and your baby while you breastfeed.  Make sure that your baby's tummy (abdomen) is facing your abdomen.  Gently massage your breast. With your fingertips, massage from the outer edges of your breast inward toward the nipple. This encourages milk flow. If your milk flows slowly, you may need to continue this action during the feeding.  Support your breast with 4 fingers underneath and your thumb above your nipple (make the letter "C" with your hand). Make sure your fingers are well away from your nipple and your baby's mouth.  Stroke your baby's lips gently with your finger or nipple.  When your baby's mouth is open wide enough, quickly bring your baby to your breast, placing your entire nipple and as much of the areola as possible into your baby's mouth. The areola is the colored area around your nipple. ? More areola should be visible above your baby's upper lip than below the lower lip. ? Your baby's lips should be opened and extended outward (flanged) to ensure an adequate, comfortable latch. ? Your baby's tongue should be between his or her lower gum and your breast.  Make sure that your baby's mouth is correctly positioned around your nipple (latched). Your baby's lips should create a seal on your breast and be turned out (everted).  It is common for your baby to suck about 2-3 minutes in order to start the flow of breast milk. Latching Teaching your baby how to latch onto your breast properly is very important. An improper latch can cause nipple pain, decreased milk  supply, and poor weight gain in your baby. Also, if your baby is not latched onto your nipple properly, he or she may swallow some air during feeding. This can make your baby fussy. Burping your baby when you switch breasts during the feeding can help to get rid   of the air. However, teaching your baby to latch on properly is still the best way to prevent fussiness from swallowing air while breastfeeding. Signs that your baby has successfully latched onto your nipple  Silent tugging or silent sucking, without causing you pain. Infant's lips should be extended outward (flanged).  Swallowing heard between every 3-4 sucks once your milk has started to flow (after your let-down milk reflex occurs).  Muscle movement above and in front of his or her ears while sucking. Signs that your baby has not successfully latched onto your nipple  Sucking sounds or smacking sounds from your baby while breastfeeding.  Nipple pain. If you think your baby has not latched on correctly, slip your finger into the corner of your baby's mouth to break the suction and place it between your baby's gums. Attempt to start breastfeeding again. Signs of successful breastfeeding Signs from your baby  Your baby will gradually decrease the number of sucks or will completely stop sucking.  Your baby will fall asleep.  Your baby's body will relax.  Your baby will retain a small amount of milk in his or her mouth.  Your baby will let go of your breast by himself or herself. Signs from you  Breasts that have increased in firmness, weight, and size 1-3 hours after feeding.  Breasts that are softer immediately after breastfeeding.  Increased milk volume, as well as a change in milk consistency and color by the fifth day of breastfeeding.  Nipples that are not sore, cracked, or bleeding. Signs that your baby is getting enough milk  Wetting at least 1-2 diapers during the first 24 hours after birth.  Wetting at least 5-6  diapers every 24 hours for the first week after birth. The urine should be clear or pale yellow by the age of 5 days.  Wetting 6-8 diapers every 24 hours as your baby continues to grow and develop.  At least 3 stools in a 24-hour period by the age of 5 days. The stool should be soft and yellow.  At least 3 stools in a 24-hour period by the age of 7 days. The stool should be seedy and yellow.  No loss of weight greater than 10% of birth weight during the first 3 days of life.  Average weight gain of 4-7 oz (113-198 g) per week after the age of 4 days.  Consistent daily weight gain by the age of 5 days, without weight loss after the age of 2 weeks. After a feeding, your baby may spit up a small amount of milk. This is normal. Breastfeeding frequency and duration Frequent feeding will help you make more milk and can prevent sore nipples and extremely full breasts (breast engorgement). Breastfeed when you feel the need to reduce the fullness of your breasts or when your baby shows signs of hunger. This is called "breastfeeding on demand." Signs that your baby is hungry include:  Increased alertness, activity, or restlessness.  Movement of the head from side to side.  Opening of the mouth when the corner of the mouth or cheek is stroked (rooting).  Increased sucking sounds, smacking lips, cooing, sighing, or squeaking.  Hand-to-mouth movements and sucking on fingers or hands.  Fussing or crying. Avoid introducing a pacifier to your baby in the first 4-6 weeks after your baby is born. After this time, you may choose to use a pacifier. Research has shown that pacifier use during the first year of a baby's life decreases the risk of   sudden infant death syndrome (SIDS). Allow your baby to feed on each breast as long as he or she wants. When your baby unlatches or falls asleep while feeding from the first breast, offer the second breast. Because newborns are often sleepy in the first few weeks of  life, you may need to awaken your baby to get him or her to feed. Breastfeeding times will vary from baby to baby. However, the following rules can serve as a guide to help you make sure that your baby is properly fed:  Newborns (babies 4 weeks of age or younger) may breastfeed every 1-3 hours.  Newborns should not go without breastfeeding for longer than 3 hours during the day or 5 hours during the night.  You should breastfeed your baby a minimum of 8 times in a 24-hour period. Breast milk pumping Pumping and storing breast milk allows you to make sure that your baby is exclusively fed your breast milk, even at times when you are unable to breastfeed. This is especially important if you go back to work while you are still breastfeeding, or if you are not able to be present during feedings. Your lactation consultant can help you find a method of pumping that works best for you and give you guidelines about how long it is safe to store breast milk.      Caring for your breasts while you breastfeed Nipples can become dry, cracked, and sore while breastfeeding. The following recommendations can help keep your breasts moisturized and healthy:  Avoid using soap on your nipples.  Wear a supportive bra designed especially for nursing. Avoid wearing underwire-style bras or extremely tight bras (sports bras).  Air-dry your nipples for 3-4 minutes after each feeding.  Use only cotton bra pads to absorb leaked breast milk. Leaking of breast milk between feedings is normal.  Use lanolin on your nipples after breastfeeding. Lanolin helps to maintain your skin's normal moisture barrier. Pure lanolin is not harmful (not toxic) to your baby. You may also hand express a few drops of breast milk and gently massage that milk into your nipples and allow the milk to air-dry. In the first few weeks after giving birth, some women experience breast engorgement. Engorgement can make your breasts feel heavy, warm,  and tender to the touch. Engorgement peaks within 3-5 days after you give birth. The following recommendations can help to ease engorgement:  Completely empty your breasts while breastfeeding or pumping. You may want to start by applying warm, moist heat (in the shower or with warm, water-soaked hand towels) just before feeding or pumping. This increases circulation and helps the milk flow. If your baby does not completely empty your breasts while breastfeeding, pump any extra milk after he or she is finished.  Apply ice packs to your breasts immediately after breastfeeding or pumping, unless this is too uncomfortable for you. To do this: ? Put ice in a plastic bag. ? Place a towel between your skin and the bag. ? Leave the ice on for 20 minutes, 2-3 times a day.  Make sure that your baby is latched on and positioned properly while breastfeeding. If engorgement persists after 48 hours of following these recommendations, contact your health care provider or a lactation consultant. Overall health care recommendations while breastfeeding  Eat 3 healthy meals and 3 snacks every day. Well-nourished mothers who are breastfeeding need an additional 450-500 calories a day. You can meet this requirement by increasing the amount of a balanced diet that   you eat.  Drink enough water to keep your urine pale yellow or clear.  Rest often, relax, and continue to take your prenatal vitamins to prevent fatigue, stress, and low vitamin and mineral levels in your body (nutrient deficiencies).  Do not use any products that contain nicotine or tobacco, such as cigarettes and e-cigarettes. Your baby may be harmed by chemicals from cigarettes that pass into breast milk and exposure to secondhand smoke. If you need help quitting, ask your health care provider.  Avoid alcohol.  Do not use illegal drugs or marijuana.  Talk with your health care provider before taking any medicines. These include over-the-counter and  prescription medicines as well as vitamins and herbal supplements. Some medicines that may be harmful to your baby can pass through breast milk.  It is possible to become pregnant while breastfeeding. If birth control is desired, ask your health care provider about options that will be safe while breastfeeding your baby. Where to find more information: [employer]: www.llli.org Contact a health care provider if:  You feel like you want to stop breastfeeding or have become frustrated with breastfeeding.  Your nipples are cracked or bleeding.  Your breasts are red, tender, or warm.  You have: ? Painful breasts or nipples. ? A swollen area on either breast. ? A fever or chills. ? Nausea or vomiting. ? Drainage other than breast milk from your nipples.  Your breasts do not become full before feedings by the fifth day after you give birth.  You feel sad and depressed.  Your baby is: ? Too sleepy to eat well. ? Having trouble sleeping. ? More than 1 week old and wetting fewer than 6 diapers in a 24-hour period. ? Not gaining weight by 5 days of age.  Your baby has fewer than 3 stools in a 24-hour period.  Your baby's skin or the white parts of his or her eyes become yellow. Get help right away if:  Your baby is overly tired (lethargic) and does not want to wake up and feed.  Your baby develops an unexplained fever. Summary  Breastfeeding offers many health benefits for infant and mothers.  Try to breastfeed your infant when he or she shows early signs of hunger.  Gently tickle or stroke your baby's lips with your finger or nipple to allow the baby to open his or her mouth. Bring the baby to your breast. Make sure that much of the areola is in your baby's mouth. Offer one side and burp the baby before you offer the other side.  Talk with your health care provider or lactation consultant if you have questions or you face problems as you breastfeed. This  information is not intended to replace advice given to you by your health care provider. Make sure you discuss any questions you have with your health care provider. Document Revised: 04/30/2017 Document Reviewed: 03/07/2016 Elsevier Patient Education  2021 [employer].  

## 2020-04-12 LAB — PROTEIN / CREATININE RATIO, URINE
Creatinine, Urine: 129.9 mg/dL
Protein, Ur: 15.9 mg/dL
Protein/Creat Ratio: 122 mg/g creat (ref 0–200)

## 2020-04-12 LAB — TSH: TSH: 1.14 u[IU]/mL (ref 0.450–4.500)

## 2020-04-12 LAB — T4, FREE: Free T4: 0.97 ng/dL (ref 0.82–1.77)

## 2020-04-13 LAB — COMPREHENSIVE METABOLIC PANEL
ALT: 12 IU/L (ref 0–32)
AST: 20 IU/L (ref 0–40)
Albumin/Globulin Ratio: 1.6 (ref 1.2–2.2)
Albumin: 4.3 g/dL (ref 3.8–4.8)
Alkaline Phosphatase: 39 IU/L — ABNORMAL LOW (ref 44–121)
BUN/Creatinine Ratio: 17 (ref 9–23)
BUN: 7 mg/dL (ref 6–24)
Bilirubin Total: 0.4 mg/dL (ref 0.0–1.2)
CO2: 17 mmol/L — ABNORMAL LOW (ref 20–29)
Calcium: 9.3 mg/dL (ref 8.7–10.2)
Chloride: 101 mmol/L (ref 96–106)
Creatinine, Ser: 0.41 mg/dL — ABNORMAL LOW (ref 0.57–1.00)
GFR calc Af Amer: 149 mL/min/{1.73_m2} (ref >59)
GFR calc non Af Amer: 130 mL/min/{1.73_m2} (ref >59)
Globulin, Total: 2.7 g/dL (ref 1.5–4.5)
Glucose: 92 mg/dL (ref 65–99)
Potassium: 4 mmol/L (ref 3.5–5.2)
Sodium: 137 mmol/L (ref 134–144)
Total Protein: 7 g/dL (ref 6.0–8.5)

## 2020-04-13 LAB — AFP, SERUM, OPEN SPINA BIFIDA
AFP MoM: 1.38
AFP Value: 54.1 ng/mL
Gest. Age on Collection Date: 18.1 weeks
Maternal Age At EDD: 40.5 yr
OSBR Risk 1 IN: 3810
Test Results:: NEGATIVE
Weight: 184 [lb_av]

## 2020-04-13 LAB — CBC/D/PLT+RPR+RH+ABO+RUB AB...
Antibody Screen: NEGATIVE
Basophils Absolute: 0 10*3/uL (ref 0.0–0.2)
Basos: 0 %
EOS (ABSOLUTE): 0.1 10*3/uL (ref 0.0–0.4)
Eos: 1 %
HCV Ab: <0.1 s/co ratio (ref 0.0–0.9)
HIV Screen 4th Generation wRfx: NONREACTIVE
Hematocrit: 35 % (ref 34.0–46.6)
Hemoglobin: 11.6 g/dL (ref 11.1–15.9)
Hepatitis B Surface Ag: NEGATIVE
Immature Grans (Abs): 0.1 10*3/uL (ref 0.0–0.1)
Immature Granulocytes: 1 %
Lymphocytes Absolute: 2 10*3/uL (ref 0.7–3.1)
Lymphs: 20 %
MCH: 22.7 pg — ABNORMAL LOW (ref 26.6–33.0)
MCHC: 33.1 g/dL (ref 31.5–35.7)
MCV: 69 fL — ABNORMAL LOW (ref 79–97)
Monocytes Absolute: 0.4 10*3/uL (ref 0.1–0.9)
Monocytes: 4 %
Neutrophils Absolute: 7.4 10*3/uL — ABNORMAL HIGH (ref 1.4–7.0)
Neutrophils: 74 %
Platelets: 373 10*3/uL (ref 150–450)
RBC: 5.1 x10E6/uL (ref 3.77–5.28)
RDW: 15.3 % (ref 11.7–15.4)
RPR Ser Ql: NONREACTIVE
Rh Factor: POSITIVE
Rubella Antibodies, IGG: 29.2 index (ref >0.99)
WBC: 10 10*3/uL (ref 3.4–10.8)

## 2020-04-13 LAB — CULTURE, OB URINE

## 2020-04-13 LAB — URINE CULTURE, OB REFLEX

## 2020-04-13 LAB — HCV INTERPRETATION

## 2020-04-16 LAB — CYTOLOGY - PAP
Chlamydia: NEGATIVE
Comment: NEGATIVE
Comment: NEGATIVE
Comment: NEGATIVE
Comment: NORMAL
Diagnosis: NEGATIVE
High risk HPV: NEGATIVE
Neisseria Gonorrhea: NEGATIVE
Trichomonas: NEGATIVE

## 2020-04-17 ENCOUNTER — Encounter: Payer: Self-pay | Admitting: *Deleted

## 2020-04-24 ENCOUNTER — Other Ambulatory Visit: Payer: No Typology Code available for payment source

## 2020-04-25 ENCOUNTER — Ambulatory Visit: Payer: No Typology Code available for payment source | Admitting: *Deleted

## 2020-04-25 ENCOUNTER — Other Ambulatory Visit: Payer: Self-pay

## 2020-04-25 ENCOUNTER — Ambulatory Visit: Payer: No Typology Code available for payment source | Attending: Family Medicine

## 2020-04-25 ENCOUNTER — Encounter: Payer: Self-pay | Admitting: *Deleted

## 2020-04-25 DIAGNOSIS — O0990 Supervision of high risk pregnancy, unspecified, unspecified trimester: Secondary | ICD-10-CM

## 2020-04-26 ENCOUNTER — Other Ambulatory Visit: Payer: Self-pay | Admitting: *Deleted

## 2020-04-26 DIAGNOSIS — O10912 Unspecified pre-existing hypertension complicating pregnancy, second trimester: Secondary | ICD-10-CM

## 2020-05-01 ENCOUNTER — Encounter: Payer: No Typology Code available for payment source | Attending: Family Medicine | Admitting: Registered"

## 2020-05-01 ENCOUNTER — Other Ambulatory Visit: Payer: Self-pay

## 2020-05-01 ENCOUNTER — Ambulatory Visit: Payer: No Typology Code available for payment source | Admitting: Registered"

## 2020-05-01 DIAGNOSIS — O24419 Gestational diabetes mellitus in pregnancy, unspecified control: Secondary | ICD-10-CM | POA: Insufficient documentation

## 2020-05-01 DIAGNOSIS — Z8632 Personal history of gestational diabetes: Secondary | ICD-10-CM

## 2020-05-01 NOTE — Progress Notes (Addendum)
Medical Nutrition Therapy  Appointment Start time:  1515  Appointment End time:  1600  Primary concerns today: healthy pregnancy, understanding risk for GDM Referral diagnosis: history of gestational diabetes Preferred learning style: no preference indicated Learning readiness: ready  NUTRITION ASSESSMENT   Anthropometrics  Wt Readings from Last 3 Encounters:  04/11/20 184 lb (83.5 kg)  02/29/20 184 lb 6.4 oz (83.6 kg)  01/20/20 173 lb 12.8 oz (78.8 kg)     Clinical Medical Hx: Graves disease, prior pregnancy included: pregnancy induced hypertension, pre-eclampsia and GDM. pt states she was told she has pre-diabetes by endocrinologist,  Medications: aspirin, iron, prenatal Labs: Component Ref Range & Units 2 mo ago 5 mo ago  Hgb A1c MFr Bld 4.8 - 5.6 % 6.2High  6.0High CM    Notable Signs/Symptoms: none  Lifestyle & Dietary Hx  Patient states she thinks she had GDM nutrition education with her last pregnancy 4 yrs ago. Pt states she has low vit D and iron levels. Pt reports she does not eat or drink much dairy.  Accu-chek Guide Me Lot #[id number] Exp: 04/08/2021 CBG: 83 mg/dL  Estimated daily fluid intake: >70 oz water Supplements: none Sleep: "is tired, goes to sleep easily" Stress / self-care: not assessed Current average weekly physical activity: Patient states she is active at work, sometimes on her feet and moving 10 hours.   24-Hr Dietary Recall First Meal: banana, water Snack: fruit (sometimes whole mango) Second Meal: noodle cups Snack: fruit Third Meal: 2 c rice, vegetables, meat Snack: fruit Beverages: water, 1 c instant coffee, 1 12 oz diet soda  Estimated Energy Needs Calories: 2000   NUTRITION DIAGNOSIS  NB-1.1 Food and nutrition-related knowledge deficit As related to appropriate amount of rice in a meal.  As evidenced by reported typical serving of rice = 90 g carbs .   NUTRITION INTERVENTION  Nutrition education (E-1) on the following  topics:  . Nutrition education for managing blood glucose with diet and lifestyle changes. Defined the role of glucose and insulin.  Identified gestational diabetes and pathophysiology.   Described the role of different macronutrients on glucose.  Explained how carbohydrates affect blood glucose.  Stated what foods contain the most carbohydrates.  Demonstrated carbohydrate counting.  Demonstrated how to read Nutrition Facts food label. . Discussed iron content in foods  Handouts Provided Include   Iron Deficiency Anemia (AND MNT therapy)  Gestational Diabetes  A1c chart  MyPlate for Moms  Learning Style & Readiness for Change Teaching method utilized: Visual & Auditory  Demonstrated degree of understanding via: Teach Back  Barriers to learning/adherence to lifestyle change: none  Goals Established by Pt . Eat balanced meals and snacks . Work on getting more sleep . Check blood sugar Fasting and 2 hours after lunch for 1 week until next MD appointment.    MONITORING & EVALUATION Dietary intake, weekly physical activity, and blood sugar in prn.  Next Steps  Patient is to call for follow-up appointment if blood sugar levels are elevated or if patient has additional nutrition questions.

## 2020-05-09 ENCOUNTER — Encounter: Payer: Self-pay | Admitting: Family Medicine

## 2020-05-09 ENCOUNTER — Other Ambulatory Visit: Payer: Self-pay

## 2020-05-09 ENCOUNTER — Ambulatory Visit (INDEPENDENT_AMBULATORY_CARE_PROVIDER_SITE_OTHER): Payer: No Typology Code available for payment source | Admitting: Family Medicine

## 2020-05-09 VITALS — BP 156/96 | HR 109 | Wt 186.8 lb

## 2020-05-09 DIAGNOSIS — Z8759 Personal history of other complications of pregnancy, childbirth and the puerperium: Secondary | ICD-10-CM

## 2020-05-09 DIAGNOSIS — O24410 Gestational diabetes mellitus in pregnancy, diet controlled: Secondary | ICD-10-CM

## 2020-05-09 DIAGNOSIS — D649 Anemia, unspecified: Secondary | ICD-10-CM

## 2020-05-09 DIAGNOSIS — E0500 Thyrotoxicosis with diffuse goiter without thyrotoxic crisis or storm: Secondary | ICD-10-CM

## 2020-05-09 DIAGNOSIS — Z98891 History of uterine scar from previous surgery: Secondary | ICD-10-CM

## 2020-05-09 DIAGNOSIS — O10919 Unspecified pre-existing hypertension complicating pregnancy, unspecified trimester: Secondary | ICD-10-CM

## 2020-05-09 DIAGNOSIS — Z8751 Personal history of pre-term labor: Secondary | ICD-10-CM

## 2020-05-09 DIAGNOSIS — O0990 Supervision of high risk pregnancy, unspecified, unspecified trimester: Secondary | ICD-10-CM

## 2020-05-09 MED ORDER — ALCOHOL WIPES 70 % PADS: 1.0000 "application " | MEDICATED_PAD | Freq: Four times a day (QID) | 11 refills | Status: DC

## 2020-05-09 MED ORDER — GLUCOSE BLOOD VI STRP: ORAL_STRIP | 12 refills | Status: DC

## 2020-05-09 MED ORDER — ONETOUCH VERIO W/DEVICE KIT
1.0000 | PACK | Freq: Four times a day (QID) | 0 refills | Status: DC
Start: 2020-05-09 — End: 2020-08-16

## 2020-05-09 MED ORDER — ONETOUCH ULTRASOFT LANCETS MISC: 12 refills | Status: DC

## 2020-05-09 NOTE — Progress Notes (Signed)
   Subjective:  Tammy Byrd is a 40 y.o. [id number] at [redacted]w[redacted]d being seen today for ongoing prenatal care.  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  Patient reports no complaints.  Contractions: Not present. Vag. Bleeding: None.  Movement: Present. Denies leaking of fluid.   The following portions of the patient's history were reviewed and updated as appropriate: allergies, current medications, past family history, past medical history, past social history, past surgical history and problem list. Problem list updated.  Objective:   Vitals:   05/09/20 1540 05/09/20 1542  BP: (!) 150/91 (!) 156/96  Pulse: 92 (!) 109  Weight: 186 lb 12.8 oz (84.7 kg)     Fetal Status: Fetal Heart Rate (bpm): 143   Movement: Present     General:  Alert, oriented and cooperative. Patient is in no acute distress.  Skin: Skin is warm and dry. No rash noted.   Cardiovascular: Normal heart rate noted  Respiratory: Normal respiratory effort, no problems with respiration noted  Abdomen: Soft, gravid, appropriate for gestational age. Pain/Pressure: Absent     Pelvic: Vag. Bleeding: None     Cervical exam deferred        Extremities: Normal range of motion.  Edema: None  Mental Status: Normal mood and affect. Normal behavior. Normal judgment and thought content.   Urinalysis:      Assessment and Plan:  Pregnancy: [id number] at [redacted]w[redacted]d  1. Supervision of high risk pregnancy, antepartum See for below for BP FHR normal Has decided on Nexplanon for PP contraception  2. Chronic hypertension affecting  pregnancy BP elevated today Recommended we increase Nifedipine but patient hesitant and reports normal BP's at home Instructed to return in 1 week for nurse BP check and comparison with home cuff, if still elevated will increase Nifedipine  3. Graves disease TFT's normal at last visit, check again with 3rd trimester labs  4. Anemia, unspecified type Last hgb normal  5. History of cesarean delivery For breech with last pregnancy, desires VBAC Sign consent next visit  6. Diet controlled gestational diabetes mellitus (GDM) in second trimester Very sparse checking of sugars, has had issues getting supplies With assistance of [facility] pharmacist [place] sent new meter and supplies that will be covered by her insurance Fasting sugars elevated but only three values, post prandials controlled but only a handful recorded Stressed importance of checking QID to see if we need to increase her treatment Follow up on sugars at nurse visit Fetal echo ordered today  7. History of preterm delivery Iatrogenic in setting of pre e  8. History of severe pre-eclampsia On ASA  Preterm labor symptoms and general obstetric precautions including but not limited to vaginal bleeding, contractions, leaking of fluid and fetal movement were reviewed in detail with the patient. Please refer to After Visit Summary for other counseling recommendations.  Return in 5 weeks (on 06/13/2020) for [facility], ob visit, needs MD, 28 wk labs.   Venora Maples, MD

## 2020-05-09 NOTE — Progress Notes (Signed)
Patient started taking nifedipine 30mg /d 2 weeks ago. She does endorse some dizziness with standing and occasional headache that goes away with acetaminophen. Otherwise, she has had no other symptoms or adverse reactions. BP today is elevated at 156/96; however she feels this is due to coming into the office. Babyscript readings at home are WNL (120-130s/70-80s). She mentions she does not think she could tolerate a dose increase with Procardia XL. Would recommend continuing current dose of Procardia XL and have her bring in home BP cuff to compare readings to our devices and ensure accurate.  Patient has also started taking blood glucose measurements at home. FBGs are elevated ~90s-100s. Postprandial measurements are within normal range. May consider starting low dose metformin or low-dose NPH.

## 2020-05-09 NOTE — Patient Instructions (Signed)
 Contraception Choices Contraception, also called birth control, refers to methods or devices that prevent pregnancy. Hormonal methods Contraceptive implant A contraceptive implant is a thin, plastic tube that contains a hormone that prevents pregnancy. It is different from an intrauterine device (IUD). It is inserted into the upper part of the arm by a health care provider. Implants can be effective for up to 3 years. Progestin-only injections Progestin-only injections are injections of progestin, a synthetic form of the hormone progesterone. They are given every 3 months by a health care provider. Birth control pills Birth control pills are pills that contain hormones that prevent pregnancy. They must be taken once a day, preferably at the same time each day. A prescription is needed to use this method of contraception. Birth control patch The birth control patch contains hormones that prevent pregnancy. It is placed on the skin and must be changed once a week for three weeks and removed on the fourth week. A prescription is needed to use this method of contraception. Vaginal ring A vaginal ring contains hormones that prevent pregnancy. It is placed in the vagina for three weeks and removed on the fourth week. After that, the process is repeated with a new ring. A prescription is needed to use this method of contraception. Emergency contraceptive Emergency contraceptives prevent pregnancy after unprotected sex. They come in pill form and can be taken up to 5 days after sex. They work best the sooner they are taken after having sex. Most emergency contraceptives are available without a prescription. This method should not be used as your only form of birth control.   Barrier methods Female condom A female condom is a thin sheath that is worn over the penis during sex. Condoms keep sperm from going inside a woman's body. They can be used with a sperm-killing substance (spermicide) to increase their  effectiveness. They should be thrown away after one use. Female condom A female condom is a soft, loose-fitting sheath that is put into the vagina before sex. The condom keeps sperm from going inside a woman's body. They should be thrown away after one use. Diaphragm A diaphragm is a soft, dome-shaped barrier. It is inserted into the vagina before sex, along with a spermicide. The diaphragm blocks sperm from entering the uterus, and the spermicide kills sperm. A diaphragm should be left in the vagina for 6-8 hours after sex and removed within 24 hours. A diaphragm is prescribed and fitted by a health care provider. A diaphragm should be replaced every 1-2 years, after giving birth, after gaining more than 15 lb (6.8 kg), and after pelvic surgery. Cervical cap A cervical cap is a round, soft latex or plastic cup that fits over the cervix. It is inserted into the vagina before sex, along with spermicide. It blocks sperm from entering the uterus. The cap should be left in place for 6-8 hours after sex and removed within 48 hours. A cervical cap must be prescribed and fitted by a health care provider. It should be replaced every 2 years. Sponge A sponge is a soft, circular piece of polyurethane foam with spermicide in it. The sponge helps block sperm from entering the uterus, and the spermicide kills sperm. To use it, you make it wet and then insert it into the vagina. It should be inserted before sex, left in for at least 6 hours after sex, and removed and thrown away within 30 hours. Spermicides Spermicides are chemicals that kill or block sperm from entering the   cervix and uterus. They can come as a cream, jelly, suppository, foam, or tablet. A spermicide should be inserted into the vagina with an applicator at least 10-15 minutes before sex to allow time for it to work. The process must be repeated every time you have sex. Spermicides do not require a prescription.   Intrauterine  contraception Intrauterine device (IUD) An IUD is a T-shaped device that is put in a woman's uterus. There are two types:  Hormone IUD.This type contains progestin, a synthetic form of the hormone progesterone. This type can stay in place for 3-5 years.  Copper IUD.This type is wrapped in copper wire. It can stay in place for 10 years. Permanent methods of contraception Female tubal ligation In this method, a woman's fallopian tubes are sealed, tied, or blocked during surgery to prevent eggs from traveling to the uterus. Hysteroscopic sterilization In this method, a small, flexible insert is placed into each fallopian tube. The inserts cause scar tissue to form in the fallopian tubes and block them, so sperm cannot reach an egg. The procedure takes about 3 months to be effective. Another form of birth control must be used during those 3 months. Female sterilization This is a procedure to tie off the tubes that carry sperm (vasectomy). After the procedure, the man can still ejaculate fluid (semen). Another form of birth control must be used for 3 months after the procedure. Natural planning methods Natural family planning In this method, a couple does not have sex on days when the woman could become pregnant. Calendar method In this method, the woman keeps track of the length of each menstrual cycle, identifies the days when pregnancy can happen, and does not have sex on those days. Ovulation method In this method, a couple avoids sex during ovulation. Symptothermal method This method involves not having sex during ovulation. The woman typically checks for ovulation by watching changes in her temperature and in the consistency of cervical mucus. Post-ovulation method In this method, a couple waits to have sex until after ovulation. Where to find more information  Centers for Disease Control and Prevention: www.cdc.gov Summary  Contraception, also called birth control, refers to methods or  devices that prevent pregnancy.  Hormonal methods of contraception include implants, injections, pills, patches, vaginal rings, and emergency contraceptives.  Barrier methods of contraception can include female condoms, female condoms, diaphragms, cervical caps, sponges, and spermicides.  There are two types of IUDs (intrauterine devices). An IUD can be put in a woman's uterus to prevent pregnancy for 3-5 years.  Permanent sterilization can be done through a procedure for males and females. Natural family planning methods involve nothaving sex on days when the woman could become pregnant. This information is not intended to replace advice given to you by your health care provider. Make sure you discuss any questions you have with your health care provider. Document Revised: 07/11/2019 Document Reviewed: 07/11/2019 Elsevier Patient Education  2021 [employer] Inc.   Breastfeeding  Choosing to breastfeed is one of the best decisions you can make for yourself and your baby. A change in hormones during pregnancy causes your breasts to make breast milk in your milk-producing glands. Hormones prevent breast milk from being released before your baby is born. They also prompt milk flow after birth. Once breastfeeding has begun, thoughts of your baby, as well as his or her sucking or crying, can stimulate the release of milk from your milk-producing glands. Benefits of breastfeeding Research shows that breastfeeding offers many health benefits   for infants and mothers. It also offers a cost-free and convenient way to feed your baby. For your baby  Your first milk (colostrum) helps your baby's digestive system to function better.  Special cells in your milk (antibodies) help your baby to fight off infections.  Breastfed babies are less likely to develop asthma, allergies, obesity, or type 2 diabetes. They are also at lower risk for sudden infant death syndrome (SIDS).  Nutrients in breast milk are better  able to meet your baby's needs compared to infant formula.  Breast milk improves your baby's brain development. For you  Breastfeeding helps to create a very special bond between you and your baby.  Breastfeeding is convenient. Breast milk costs nothing and is always available at the correct temperature.  Breastfeeding helps to burn calories. It helps you to lose the weight that you gained during pregnancy.  Breastfeeding makes your uterus return faster to its size before pregnancy. It also slows bleeding (lochia) after you give birth.  Breastfeeding helps to lower your risk of developing type 2 diabetes, osteoporosis, rheumatoid arthritis, cardiovascular disease, and breast, ovarian, uterine, and endometrial cancer later in life. Breastfeeding basics Starting breastfeeding  Find a comfortable place to sit or lie down, with your neck and back well-supported.  Place a pillow or a rolled-up blanket under your baby to bring him or her to the level of your breast (if you are seated). Nursing pillows are specially designed to help support your arms and your baby while you breastfeed.  Make sure that your baby's tummy (abdomen) is facing your abdomen.  Gently massage your breast. With your fingertips, massage from the outer edges of your breast inward toward the nipple. This encourages milk flow. If your milk flows slowly, you may need to continue this action during the feeding.  Support your breast with 4 fingers underneath and your thumb above your nipple (make the letter "C" with your hand). Make sure your fingers are well away from your nipple and your baby's mouth.  Stroke your baby's lips gently with your finger or nipple.  When your baby's mouth is open wide enough, quickly bring your baby to your breast, placing your entire nipple and as much of the areola as possible into your baby's mouth. The areola is the colored area around your nipple. ? More areola should be visible above your  baby's upper lip than below the lower lip. ? Your baby's lips should be opened and extended outward (flanged) to ensure an adequate, comfortable latch. ? Your baby's tongue should be between his or her lower gum and your breast.  Make sure that your baby's mouth is correctly positioned around your nipple (latched). Your baby's lips should create a seal on your breast and be turned out (everted).  It is common for your baby to suck about 2-3 minutes in order to start the flow of breast milk. Latching Teaching your baby how to latch onto your breast properly is very important. An improper latch can cause nipple pain, decreased milk supply, and poor weight gain in your baby. Also, if your baby is not latched onto your nipple properly, he or she may swallow some air during feeding. This can make your baby fussy. Burping your baby when you switch breasts during the feeding can help to get rid of the air. However, teaching your baby to latch on properly is still the best way to prevent fussiness from swallowing air while breastfeeding. Signs that your baby has successfully latched onto   your nipple  Silent tugging or silent sucking, without causing you pain. Infant's lips should be extended outward (flanged).  Swallowing heard between every 3-4 sucks once your milk has started to flow (after your let-down milk reflex occurs).  Muscle movement above and in front of his or her ears while sucking. Signs that your baby has not successfully latched onto your nipple  Sucking sounds or smacking sounds from your baby while breastfeeding.  Nipple pain. If you think your baby has not latched on correctly, slip your finger into the corner of your baby's mouth to break the suction and place it between your baby's gums. Attempt to start breastfeeding again. Signs of successful breastfeeding Signs from your baby  Your baby will gradually decrease the number of sucks or will completely stop sucking.  Your baby  will fall asleep.  Your baby's body will relax.  Your baby will retain a small amount of milk in his or her mouth.  Your baby will let go of your breast by himself or herself. Signs from you  Breasts that have increased in firmness, weight, and size 1-3 hours after feeding.  Breasts that are softer immediately after breastfeeding.  Increased milk volume, as well as a change in milk consistency and color by the fifth day of breastfeeding.  Nipples that are not sore, cracked, or bleeding. Signs that your baby is getting enough milk  Wetting at least 1-2 diapers during the first 24 hours after birth.  Wetting at least 5-6 diapers every 24 hours for the first week after birth. The urine should be clear or pale yellow by the age of 5 days.  Wetting 6-8 diapers every 24 hours as your baby continues to grow and develop.  At least 3 stools in a 24-hour period by the age of 5 days. The stool should be soft and yellow.  At least 3 stools in a 24-hour period by the age of 7 days. The stool should be seedy and yellow.  No loss of weight greater than 10% of birth weight during the first 3 days of life.  Average weight gain of 4-7 oz (113-198 g) per week after the age of 4 days.  Consistent daily weight gain by the age of 5 days, without weight loss after the age of 2 weeks. After a feeding, your baby may spit up a small amount of milk. This is normal. Breastfeeding frequency and duration Frequent feeding will help you make more milk and can prevent sore nipples and extremely full breasts (breast engorgement). Breastfeed when you feel the need to reduce the fullness of your breasts or when your baby shows signs of hunger. This is called "breastfeeding on demand." Signs that your baby is hungry include:  Increased alertness, activity, or restlessness.  Movement of the head from side to side.  Opening of the mouth when the corner of the mouth or cheek is stroked (rooting).  Increased  sucking sounds, smacking lips, cooing, sighing, or squeaking.  Hand-to-mouth movements and sucking on fingers or hands.  Fussing or crying. Avoid introducing a pacifier to your baby in the first 4-6 weeks after your baby is born. After this time, you may choose to use a pacifier. Research has shown that pacifier use during the first year of a baby's life decreases the risk of sudden infant death syndrome (SIDS). Allow your baby to feed on each breast as long as he or she wants. When your baby unlatches or falls asleep while feeding from the   first breast, offer the second breast. Because newborns are often sleepy in the first few weeks of life, you may need to awaken your baby to get him or her to feed. Breastfeeding times will vary from baby to baby. However, the following rules can serve as a guide to help you make sure that your baby is properly fed:  Newborns (babies 4 weeks of age or younger) may breastfeed every 1-3 hours.  Newborns should not go without breastfeeding for longer than 3 hours during the day or 5 hours during the night.  You should breastfeed your baby a minimum of 8 times in a 24-hour period. Breast milk pumping Pumping and storing breast milk allows you to make sure that your baby is exclusively fed your breast milk, even at times when you are unable to breastfeed. This is especially important if you go back to work while you are still breastfeeding, or if you are not able to be present during feedings. Your lactation consultant can help you find a method of pumping that works best for you and give you guidelines about how long it is safe to store breast milk.      Caring for your breasts while you breastfeed Nipples can become dry, cracked, and sore while breastfeeding. The following recommendations can help keep your breasts moisturized and healthy:  Avoid using soap on your nipples.  Wear a supportive bra designed especially for nursing. Avoid wearing underwire-style  bras or extremely tight bras (sports bras).  Air-dry your nipples for 3-4 minutes after each feeding.  Use only cotton bra pads to absorb leaked breast milk. Leaking of breast milk between feedings is normal.  Use lanolin on your nipples after breastfeeding. Lanolin helps to maintain your skin's normal moisture barrier. Pure lanolin is not harmful (not toxic) to your baby. You may also hand express a few drops of breast milk and gently massage that milk into your nipples and allow the milk to air-dry. In the first few weeks after giving birth, some women experience breast engorgement. Engorgement can make your breasts feel heavy, warm, and tender to the touch. Engorgement peaks within 3-5 days after you give birth. The following recommendations can help to ease engorgement:  Completely empty your breasts while breastfeeding or pumping. You may want to start by applying warm, moist heat (in the shower or with warm, water-soaked hand towels) just before feeding or pumping. This increases circulation and helps the milk flow. If your baby does not completely empty your breasts while breastfeeding, pump any extra milk after he or she is finished.  Apply ice packs to your breasts immediately after breastfeeding or pumping, unless this is too uncomfortable for you. To do this: ? Put ice in a plastic bag. ? Place a towel between your skin and the bag. ? Leave the ice on for 20 minutes, 2-3 times a day.  Make sure that your baby is latched on and positioned properly while breastfeeding. If engorgement persists after 48 hours of following these recommendations, contact your health care provider or a lactation consultant. Overall health care recommendations while breastfeeding  Eat 3 healthy meals and 3 snacks every day. Well-nourished mothers who are breastfeeding need an additional 450-500 calories a day. You can meet this requirement by increasing the amount of a balanced diet that you eat.  Drink  enough water to keep your urine pale yellow or clear.  Rest often, relax, and continue to take your prenatal vitamins to prevent fatigue, stress, and low   vitamin and mineral levels in your body (nutrient deficiencies).  Do not use any products that contain nicotine or tobacco, such as cigarettes and e-cigarettes. Your baby may be harmed by chemicals from cigarettes that pass into breast milk and exposure to secondhand smoke. If you need help quitting, ask your health care provider.  Avoid alcohol.  Do not use illegal drugs or marijuana.  Talk with your health care provider before taking any medicines. These include over-the-counter and prescription medicines as well as vitamins and herbal supplements. Some medicines that may be harmful to your baby can pass through breast milk.  It is possible to become pregnant while breastfeeding. If birth control is desired, ask your health care provider about options that will be safe while breastfeeding your baby. Where to find more information: [employer]: www.llli.org Contact a health care provider if:  You feel like you want to stop breastfeeding or have become frustrated with breastfeeding.  Your nipples are cracked or bleeding.  Your breasts are red, tender, or warm.  You have: ? Painful breasts or nipples. ? A swollen area on either breast. ? A fever or chills. ? Nausea or vomiting. ? Drainage other than breast milk from your nipples.  Your breasts do not become full before feedings by the fifth day after you give birth.  You feel sad and depressed.  Your baby is: ? Too sleepy to eat well. ? Having trouble sleeping. ? More than 1 week old and wetting fewer than 6 diapers in a 24-hour period. ? Not gaining weight by 5 days of age.  Your baby has fewer than 3 stools in a 24-hour period.  Your baby's skin or the white parts of his or her eyes become yellow. Get help right away if:  Your baby is overly tired  (lethargic) and does not want to wake up and feed.  Your baby develops an unexplained fever. Summary  Breastfeeding offers many health benefits for infant and mothers.  Try to breastfeed your infant when he or she shows early signs of hunger.  Gently tickle or stroke your baby's lips with your finger or nipple to allow the baby to open his or her mouth. Bring the baby to your breast. Make sure that much of the areola is in your baby's mouth. Offer one side and burp the baby before you offer the other side.  Talk with your health care provider or lactation consultant if you have questions or you face problems as you breastfeed. This information is not intended to replace advice given to you by your health care provider. Make sure you discuss any questions you have with your health care provider. Document Revised: 04/30/2017 Document Reviewed: 03/07/2016 Elsevier Patient Education  2021 Elsevier Inc.  

## 2020-05-10 ENCOUNTER — Telehealth: Payer: Self-pay

## 2020-05-10 NOTE — Telephone Encounter (Signed)
Called Dr. Hinton Lovely office to schedule Fetal Echo, usually they will go ahead and schedule but this time she stated that the referral has to be received first, so that has already been requested to the front desk.

## 2020-05-14 ENCOUNTER — Other Ambulatory Visit: Payer: Self-pay

## 2020-05-14 ENCOUNTER — Other Ambulatory Visit: Payer: Self-pay | Admitting: *Deleted

## 2020-05-14 DIAGNOSIS — O24419 Gestational diabetes mellitus in pregnancy, unspecified control: Secondary | ICD-10-CM

## 2020-05-16 ENCOUNTER — Ambulatory Visit (INDEPENDENT_AMBULATORY_CARE_PROVIDER_SITE_OTHER): Payer: No Typology Code available for payment source

## 2020-05-16 ENCOUNTER — Other Ambulatory Visit: Payer: Self-pay

## 2020-05-16 VITALS — BP 165/83 | HR 95 | Wt 187.1 lb

## 2020-05-16 DIAGNOSIS — O0990 Supervision of high risk pregnancy, unspecified, unspecified trimester: Secondary | ICD-10-CM

## 2020-05-16 DIAGNOSIS — O10919 Unspecified pre-existing hypertension complicating pregnancy, unspecified trimester: Secondary | ICD-10-CM

## 2020-05-16 MED ORDER — NIFEDIPINE ER OSMOTIC RELEASE 60 MG PO TB24: 60.0000 mg | ORAL_TABLET | Freq: Every day | ORAL | 5 refills | Status: DC

## 2020-05-16 NOTE — Progress Notes (Signed)
Blood Pressure Check  Patient here for blood pressure check during pregnancy at 23w 1d today. Pt reports taking BP med 2 hours prior to appt. BP today is 157/97, repeat is 165/83. Patient denies any symptoms of elevated BP today, reports headache 3 days ago that resolved with Tylenol. Home BPs remain in normal range per pt and recordings in [facility]. Reviewed with Merian Capron, MD who recommends increase nifedipine to 60 mg. Instructed pt to begin this dosage tomorrow AM. Pt will return Friday, 05/18/20 for BP check. Pt instructed to bring home BP cuff for comparison. Pt continues to express concern that her BPs are normal at home but elevated in the office. I expressed again how important it is that she bring her cuff with her so that we can be sure it is accurate. CBC, CMP, and protein/creatinine ratio collected prior to pt leaving the office.  Fleet Contras RN 05/16/20

## 2020-05-17 LAB — CBC
Hematocrit: 34.8 % (ref 34.0–46.6)
Hemoglobin: 11.3 g/dL (ref 11.1–15.9)
MCH: 22.8 pg — ABNORMAL LOW (ref 26.6–33.0)
MCHC: 32.5 g/dL (ref 31.5–35.7)
MCV: 70 fL — ABNORMAL LOW (ref 79–97)
Platelets: 324 10*3/uL (ref 150–450)
RBC: 4.96 x10E6/uL (ref 3.77–5.28)
RDW: 15.8 % — ABNORMAL HIGH (ref 11.7–15.4)
WBC: 8.7 10*3/uL (ref 3.4–10.8)

## 2020-05-17 LAB — COMPREHENSIVE METABOLIC PANEL
ALT: 8 IU/L (ref 0–32)
AST: 11 IU/L (ref 0–40)
Albumin/Globulin Ratio: 1.8 (ref 1.2–2.2)
Albumin: 4.2 g/dL (ref 3.8–4.8)
Alkaline Phosphatase: 50 IU/L (ref 44–121)
BUN/Creatinine Ratio: 21 (ref 9–23)
BUN: 6 mg/dL (ref 6–24)
Bilirubin Total: 0.3 mg/dL (ref 0.0–1.2)
CO2: 21 mmol/L (ref 20–29)
Calcium: 10.1 mg/dL (ref 8.7–10.2)
Chloride: 101 mmol/L (ref 96–106)
Creatinine, Ser: 0.28 mg/dL — ABNORMAL LOW (ref 0.57–1.00)
Globulin, Total: 2.4 g/dL (ref 1.5–4.5)
Glucose: 86 mg/dL (ref 65–99)
Potassium: 3.8 mmol/L (ref 3.5–5.2)
Sodium: 138 mmol/L (ref 134–144)
Total Protein: 6.6 g/dL (ref 6.0–8.5)
eGFR: 140 mL/min/{1.73_m2} (ref >59)

## 2020-05-17 LAB — PROTEIN / CREATININE RATIO, URINE
Creatinine, Urine: 22.3 mg/dL
Protein, Ur: <4 mg/dL

## 2020-05-17 NOTE — Progress Notes (Signed)
Chart reviewed for nurse visit. Agree with plan of care.   Venora Maples, MD 05/17/20 4:53 PM

## 2020-05-18 ENCOUNTER — Ambulatory Visit (INDEPENDENT_AMBULATORY_CARE_PROVIDER_SITE_OTHER): Payer: No Typology Code available for payment source | Admitting: *Deleted

## 2020-05-18 ENCOUNTER — Encounter: Payer: Self-pay | Admitting: *Deleted

## 2020-05-18 ENCOUNTER — Other Ambulatory Visit: Payer: Self-pay

## 2020-05-18 VITALS — BP 144/80 | HR 92 | Ht 62.0 in | Wt 185.4 lb

## 2020-05-18 DIAGNOSIS — Z0130 Encounter for examination of blood pressure without abnormal findings: Secondary | ICD-10-CM

## 2020-05-18 NOTE — Progress Notes (Signed)
Pt here for BP check 143/85. She reports that she began new dose of Nifedipine 60 mg daily on 3/31 as directed. She took today's dose 2.5 hrs ago and denies H/A or visual disturbances. Pt states she has normal BP readings @ home however when she is at a medical facility, she becomes very anxious. She brought her cuff to visit today and BP 156/99 with her machine. Pt denies H/A or visual disturbances. Following consult with Vonzella Nipple, pt was advised to continue Nifedipine 60 mg as prescribed. She should also check her BP once daily as routine and again if she develops H/A, blurry vision, dizziness or seeing spots. Pt has [place] scheduled on 4/6 and BP will be checked @ that time. If her BP is greater than 140/90, she will require Ob visit in our office on same Gerard[facility] Peltz. Pt voiced understanding of all information and instructions given.

## 2020-05-18 NOTE — Progress Notes (Signed)
Chart reviewed for nurse visit. Agree with plan of care.   Marny Lowenstein, PA-C 05/18/2020 2:22 PM

## 2020-05-23 ENCOUNTER — Ambulatory Visit: Payer: No Typology Code available for payment source | Attending: Obstetrics

## 2020-05-23 ENCOUNTER — Encounter: Payer: Self-pay | Admitting: *Deleted

## 2020-05-23 ENCOUNTER — Other Ambulatory Visit: Payer: Self-pay

## 2020-05-23 ENCOUNTER — Ambulatory Visit: Payer: No Typology Code available for payment source | Admitting: *Deleted

## 2020-05-23 DIAGNOSIS — D569 Thalassemia, unspecified: Secondary | ICD-10-CM

## 2020-05-23 DIAGNOSIS — O09212 Supervision of pregnancy with history of pre-term labor, second trimester: Secondary | ICD-10-CM | POA: Diagnosis not present

## 2020-05-23 DIAGNOSIS — Z8759 Personal history of other complications of pregnancy, childbirth and the puerperium: Secondary | ICD-10-CM

## 2020-05-23 DIAGNOSIS — Z363 Encounter for antenatal screening for malformations: Secondary | ICD-10-CM | POA: Diagnosis not present

## 2020-05-23 DIAGNOSIS — O0990 Supervision of high risk pregnancy, unspecified, unspecified trimester: Secondary | ICD-10-CM | POA: Insufficient documentation

## 2020-05-23 DIAGNOSIS — O09522 Supervision of elderly multigravida, second trimester: Secondary | ICD-10-CM

## 2020-05-23 DIAGNOSIS — O10912 Unspecified pre-existing hypertension complicating pregnancy, second trimester: Secondary | ICD-10-CM | POA: Insufficient documentation

## 2020-05-23 DIAGNOSIS — Z3A25 25 weeks gestation of pregnancy: Secondary | ICD-10-CM

## 2020-05-23 DIAGNOSIS — O10012 Pre-existing essential hypertension complicating pregnancy, second trimester: Secondary | ICD-10-CM | POA: Diagnosis not present

## 2020-05-23 DIAGNOSIS — O99012 Anemia complicating pregnancy, second trimester: Secondary | ICD-10-CM

## 2020-05-23 DIAGNOSIS — O321XX0 Maternal care for breech presentation, not applicable or unspecified: Secondary | ICD-10-CM

## 2020-05-23 DIAGNOSIS — Z8632 Personal history of gestational diabetes: Secondary | ICD-10-CM

## 2020-05-23 DIAGNOSIS — O99282 Endocrine, nutritional and metabolic diseases complicating pregnancy, second trimester: Secondary | ICD-10-CM

## 2020-05-23 DIAGNOSIS — E0500 Thyrotoxicosis with diffuse goiter without thyrotoxic crisis or storm: Secondary | ICD-10-CM

## 2020-05-23 DIAGNOSIS — Z862 Personal history of diseases of the blood and blood-forming organs and certain disorders involving the immune mechanism: Secondary | ICD-10-CM

## 2020-05-24 ENCOUNTER — Other Ambulatory Visit: Payer: Self-pay | Admitting: *Deleted

## 2020-05-24 DIAGNOSIS — O10912 Unspecified pre-existing hypertension complicating pregnancy, second trimester: Secondary | ICD-10-CM

## 2020-06-11 ENCOUNTER — Other Ambulatory Visit: Payer: Self-pay | Admitting: *Deleted

## 2020-06-11 DIAGNOSIS — O0990 Supervision of high risk pregnancy, unspecified, unspecified trimester: Secondary | ICD-10-CM

## 2020-06-13 ENCOUNTER — Encounter: Payer: Self-pay | Admitting: Obstetrics and Gynecology

## 2020-06-13 ENCOUNTER — Other Ambulatory Visit: Payer: Self-pay

## 2020-06-13 ENCOUNTER — Ambulatory Visit (INDEPENDENT_AMBULATORY_CARE_PROVIDER_SITE_OTHER): Payer: No Typology Code available for payment source | Admitting: Obstetrics and Gynecology

## 2020-06-13 VITALS — BP 151/80 | Wt 190.5 lb

## 2020-06-13 DIAGNOSIS — O0990 Supervision of high risk pregnancy, unspecified, unspecified trimester: Secondary | ICD-10-CM

## 2020-06-13 DIAGNOSIS — O24410 Gestational diabetes mellitus in pregnancy, diet controlled: Secondary | ICD-10-CM

## 2020-06-13 DIAGNOSIS — Z23 Encounter for immunization: Secondary | ICD-10-CM | POA: Diagnosis not present

## 2020-06-13 DIAGNOSIS — Z98891 History of uterine scar from previous surgery: Secondary | ICD-10-CM

## 2020-06-13 DIAGNOSIS — O10919 Unspecified pre-existing hypertension complicating pregnancy, unspecified trimester: Secondary | ICD-10-CM

## 2020-06-13 DIAGNOSIS — Z8759 Personal history of other complications of pregnancy, childbirth and the puerperium: Secondary | ICD-10-CM

## 2020-06-13 DIAGNOSIS — Z8751 Personal history of pre-term labor: Secondary | ICD-10-CM

## 2020-06-13 DIAGNOSIS — E0500 Thyrotoxicosis with diffuse goiter without thyrotoxic crisis or storm: Secondary | ICD-10-CM

## 2020-06-13 NOTE — Progress Notes (Signed)
   PRENATAL VISIT NOTE  Subjective:  Tammy Byrd is a 40 y.o. [id number] at [redacted]w[redacted]d being seen today for ongoing prenatal care.  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  Patient reports no complaints.  Contractions: Not present. Vag. Bleeding: None.  Movement: Present. Denies leaking of fluid.   The following portions of the patient's history were reviewed and updated as appropriate: allergies, current medications, past family history, past medical history, past social history, past surgical history and problem list.   Objective:   Vitals:   06/13/20 0844  BP: (!) 157/92    Fetal Status: Fetal Heart Rate (bpm): 140 Fundal Height: 27 cm Movement: Present     General:  Alert, oriented and cooperative. Patient is in no acute distress.  Skin: Skin is warm and dry. No rash noted.   Cardiovascular: Normal heart rate noted  Respiratory: Normal respiratory effort, no problems with respiration noted  Abdomen: Soft, gravid, appropriate for gestational age.  Pain/Pressure: Absent     Pelvic: Cervical exam deferred        Extremities: Normal range of motion.  Edema: Mild pitting, slight indentation  Mental Status: Normal mood and affect. Normal behavior. Normal judgment and thought content.   Assessment and Plan:  Pregnancy: [id number] at [redacted]w[redacted]d 1. Supervision of high risk pregnancy, antepartum Patient is doing well without complaints  2. Diet controlled gestational diabetes mellitus (GDM) in third trimester Patient has not been checking CBGs for the past 2 weeks. Patient with diagnosis of  GDM this pregnancy based on A1C 6.2 and h/o GDM in previous pregnancies. Patient desires GTT to confirm that she has GDM again as she does not like to check CBGs. Patient also stated that she is not willing to take insulin  3. Chronic hypertension affecting pregnancy Elevated BP this am. Patient took procardia this am. She denies any symptoms Will monitor closely Follow up growth ultrasound 5/4  4. History of cesarean delivery Patient interested in TOLAC- she plans on reviewing VBAC consent and let us know her decision at her next visit  5. History of severe pre-eclampsia Asymptomatic  6. History of preterm delivery   7. Graves disease Scheduled to follow up on 5/3 with endocrinologist  Preterm labor symptoms and general obstetric precautions including but not limited to vaginal bleeding, contractions, leaking of fluid and fetal movement were reviewed in detail with the patient. Please refer to After Visit Summary for other counseling recommendations.   Return in about 2 weeks (around 06/27/2020) for in person, ROB, High risk.  Future Appointments  Date Time Provider Department Center  06/13/2020  9:30 AM [facility] LAB [facility]  06/20/2020  3:30 PM [facility] NURSE [facility]  06/20/2020  3:45 PM [facility] US1 [facility]  07/20/2020  1:00 PM Carlus Pavlov, MD [facility] None    Catalina Antigua, MD

## 2020-06-13 NOTE — Addendum Note (Signed)
Addended by: Maxwell Marion E on: 06/13/2020 10:41 AM   Modules accepted: Orders

## 2020-06-14 LAB — GLUCOSE TOLERANCE, 2 HOURS W/ 1HR
Glucose, 1 hour: 230 mg/dL — ABNORMAL HIGH (ref 65–179)
Glucose, 2 hour: 164 mg/dL — ABNORMAL HIGH (ref 65–152)
Glucose, Fasting: 110 mg/dL — ABNORMAL HIGH (ref 65–91)

## 2020-06-14 LAB — CBC
Hematocrit: 36.2 % (ref 34.0–46.6)
Hemoglobin: 12 g/dL (ref 11.1–15.9)
MCH: 23.3 pg — ABNORMAL LOW (ref 26.6–33.0)
MCHC: 33.1 g/dL (ref 31.5–35.7)
MCV: 70 fL — ABNORMAL LOW (ref 79–97)
Platelets: 341 10*3/uL (ref 150–450)
RBC: 5.15 x10E6/uL (ref 3.77–5.28)
RDW: 15.9 % — ABNORMAL HIGH (ref 11.7–15.4)
WBC: 9.9 10*3/uL (ref 3.4–10.8)

## 2020-06-14 LAB — RPR: RPR Ser Ql: NONREACTIVE

## 2020-06-14 LAB — HIV ANTIBODY (ROUTINE TESTING W REFLEX): HIV Screen 4th Generation wRfx: NONREACTIVE

## 2020-06-15 ENCOUNTER — Telehealth: Payer: Self-pay

## 2020-06-15 NOTE — Telephone Encounter (Signed)
-----   Message from Catalina Antigua, MD sent at 06/14/2020  4:21 PM EDT ----- Please inform patient of conformation of diagnosis of GDM. She needs to resume testing and comply with diabetic diet

## 2020-06-15 NOTE — Telephone Encounter (Signed)
Spoke with pt. Pt given results and recommendations per Dr Jolayne Panther. Pt verbalized understanding and agreeable to plan of care.   Judeth Cornfield, RN  06/15/20.

## 2020-06-20 ENCOUNTER — Ambulatory Visit: Payer: No Typology Code available for payment source | Admitting: *Deleted

## 2020-06-20 ENCOUNTER — Other Ambulatory Visit: Payer: Self-pay

## 2020-06-20 ENCOUNTER — Encounter: Payer: Self-pay | Admitting: *Deleted

## 2020-06-20 ENCOUNTER — Ambulatory Visit: Payer: No Typology Code available for payment source | Attending: Obstetrics

## 2020-06-20 DIAGNOSIS — Z862 Personal history of diseases of the blood and blood-forming organs and certain disorders involving the immune mechanism: Secondary | ICD-10-CM

## 2020-06-20 DIAGNOSIS — O321XX0 Maternal care for breech presentation, not applicable or unspecified: Secondary | ICD-10-CM

## 2020-06-20 DIAGNOSIS — D569 Thalassemia, unspecified: Secondary | ICD-10-CM

## 2020-06-20 DIAGNOSIS — O0990 Supervision of high risk pregnancy, unspecified, unspecified trimester: Secondary | ICD-10-CM | POA: Insufficient documentation

## 2020-06-20 DIAGNOSIS — Z3A29 29 weeks gestation of pregnancy: Secondary | ICD-10-CM

## 2020-06-20 DIAGNOSIS — O10912 Unspecified pre-existing hypertension complicating pregnancy, second trimester: Secondary | ICD-10-CM | POA: Diagnosis present

## 2020-06-20 DIAGNOSIS — O09523 Supervision of elderly multigravida, third trimester: Secondary | ICD-10-CM

## 2020-06-20 DIAGNOSIS — O09213 Supervision of pregnancy with history of pre-term labor, third trimester: Secondary | ICD-10-CM

## 2020-06-20 DIAGNOSIS — E0500 Thyrotoxicosis with diffuse goiter without thyrotoxic crisis or storm: Secondary | ICD-10-CM

## 2020-06-20 DIAGNOSIS — O24410 Gestational diabetes mellitus in pregnancy, diet controlled: Secondary | ICD-10-CM

## 2020-06-20 DIAGNOSIS — O99283 Endocrine, nutritional and metabolic diseases complicating pregnancy, third trimester: Secondary | ICD-10-CM

## 2020-06-20 DIAGNOSIS — O10013 Pre-existing essential hypertension complicating pregnancy, third trimester: Secondary | ICD-10-CM | POA: Diagnosis not present

## 2020-06-20 DIAGNOSIS — O99013 Anemia complicating pregnancy, third trimester: Secondary | ICD-10-CM

## 2020-06-20 DIAGNOSIS — Z363 Encounter for antenatal screening for malformations: Secondary | ICD-10-CM

## 2020-06-21 ENCOUNTER — Other Ambulatory Visit: Payer: Self-pay | Admitting: *Deleted

## 2020-06-21 DIAGNOSIS — O10913 Unspecified pre-existing hypertension complicating pregnancy, third trimester: Secondary | ICD-10-CM

## 2020-07-02 ENCOUNTER — Encounter: Payer: Self-pay | Admitting: Obstetrics and Gynecology

## 2020-07-02 ENCOUNTER — Other Ambulatory Visit: Payer: Self-pay

## 2020-07-02 ENCOUNTER — Ambulatory Visit (INDEPENDENT_AMBULATORY_CARE_PROVIDER_SITE_OTHER): Payer: No Typology Code available for payment source | Admitting: Obstetrics and Gynecology

## 2020-07-02 VITALS — BP 147/100 | HR 103 | Wt 190.9 lb

## 2020-07-02 DIAGNOSIS — O24410 Gestational diabetes mellitus in pregnancy, diet controlled: Secondary | ICD-10-CM

## 2020-07-02 DIAGNOSIS — O10919 Unspecified pre-existing hypertension complicating pregnancy, unspecified trimester: Secondary | ICD-10-CM

## 2020-07-02 DIAGNOSIS — O0990 Supervision of high risk pregnancy, unspecified, unspecified trimester: Secondary | ICD-10-CM

## 2020-07-02 DIAGNOSIS — Z98891 History of uterine scar from previous surgery: Secondary | ICD-10-CM

## 2020-07-02 MED ORDER — METFORMIN HCL 500 MG PO TABS
500.0000 mg | ORAL_TABLET | Freq: Every day | ORAL | 5 refills | Status: DC
Start: 2020-07-02 — End: 2020-07-20

## 2020-07-02 MED ORDER — LABETALOL HCL 200 MG PO TABS: 200.0000 mg | ORAL_TABLET | Freq: Two times a day (BID) | ORAL | 3 refills | Status: DC

## 2020-07-02 NOTE — Progress Notes (Signed)
Subjective:  Tammy Byrd is a 40 y.o. [id number] at [redacted]w[redacted]d being seen today for ongoing prenatal care.  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  Patient reports no complaints.  Contractions: Not present. Vag. Bleeding: None.  Movement: Present. Denies leaking of fluid.   The following portions of the patient's history were reviewed and updated as appropriate: allergies, current medications, past family history, past medical history, past social history, past surgical history and problem list. Problem list updated.  Objective:   Vitals:   07/02/20 1050 07/02/20 1051  BP: (!) 156/100 (!) 147/100  Pulse: (!) 103 (!) 103  Weight: 86.6 kg     Fetal Status: Fetal Heart Rate (bpm): 154   Movement: Present     General:  Alert, oriented and cooperative. Patient is in no acute distress.  Skin: Skin is warm and dry. No rash noted.   Cardiovascular: Normal heart rate noted  Respiratory: Normal respiratory effort, no problems with respiration noted  Abdomen: Soft, gravid, appropriate for gestational age. Pain/Pressure: Present     Pelvic:  Cervical exam deferred        Extremities: Normal range of motion.  Edema: Trace  Mental Status: Normal mood and affect. Normal behavior. Normal judgment and thought content.   Urinalysis:      Assessment and Plan:  Pregnancy: [id number] at [redacted]w[redacted]d  1. Supervision of high risk pregnancy, antepartum Stable  2. Chronic hypertension affecting pregnancy BP still elevated. Denies HA or visual changes Will add Labetalol Antenatal testing scheduled Pt instructed to bring  her BP cuff with her at next visit Reports home BP readings are not as high as here - labetalol (NORMODYNE) 200 MG tablet; Take 1 tablet (200 mg total) by mouth 2 (two) times daily.  Dispense: 60 tablet; Refill: 3  3. Diet controlled gestational diabetes mellitus (GDM) in third trimester Fasting CBG's elevated Will start Metformin Antenatal testing already in process  - metFORMIN (GLUCOPHAGE) 500 MG tablet; Take 1 tablet (500 mg total) by mouth at bedtime.  Dispense: 60 tablet; Refill: 5  4. History of cesarean delivery Desires TOLAC, papers signed today  Preterm labor symptoms and general obstetric precautions including but not limited to vaginal bleeding, contractions, leaking of fluid and fetal movement were reviewed in detail with the patient. Please refer to After Visit Summary for other counseling recommendations.  Return in about 2 weeks (around 07/16/2020) for OB visit, face to face, MD only.   Hermina Staggers, MD

## 2020-07-13 ENCOUNTER — Ambulatory Visit (INDEPENDENT_AMBULATORY_CARE_PROVIDER_SITE_OTHER): Payer: No Typology Code available for payment source

## 2020-07-13 ENCOUNTER — Encounter: Payer: Self-pay | Admitting: *Deleted

## 2020-07-13 ENCOUNTER — Other Ambulatory Visit: Payer: Self-pay

## 2020-07-13 ENCOUNTER — Ambulatory Visit: Payer: No Typology Code available for payment source

## 2020-07-13 ENCOUNTER — Ambulatory Visit: Payer: No Typology Code available for payment source | Admitting: *Deleted

## 2020-07-13 VITALS — BP 160/92 | HR 83 | Wt 195.3 lb

## 2020-07-13 DIAGNOSIS — O10919 Unspecified pre-existing hypertension complicating pregnancy, unspecified trimester: Secondary | ICD-10-CM | POA: Diagnosis not present

## 2020-07-13 DIAGNOSIS — O24415 Gestational diabetes mellitus in pregnancy, controlled by oral hypoglycemic drugs: Secondary | ICD-10-CM

## 2020-07-13 DIAGNOSIS — Z3A32 32 weeks gestation of pregnancy: Secondary | ICD-10-CM

## 2020-07-13 MED ORDER — LABETALOL HCL 200 MG PO TABS: 200.0000 mg | ORAL_TABLET | Freq: Three times a day (TID) | ORAL | 3 refills | Status: DC

## 2020-07-13 NOTE — Progress Notes (Signed)
Pt informed that the ultrasound is considered a limited OB ultrasound and is not intended to be a complete ultrasound exam.  Patient also informed that the ultrasound is not being completed with the intent of assessing for fetal or placental anomalies or any pelvic abnormalities.  Explained that the purpose of today's ultrasound is to assess for presentation, BPP and amniotic fluid volume.  Patient acknowledges the purpose of the exam and the limitations of the study.    Pt denies H/A or visual disturbances. Per consult w/Julie Magnus Sinning, pt was advised to increase Labetalol to 200 mg TID. She also needs to return on 5/31 for BP check. Pt should continue to check BP once daily and go to MAU if her BP is greater than 160/105 or if she develops symptoms of pre-eclampsia. Pt voiced understanding of instructions given.

## 2020-07-17 ENCOUNTER — Other Ambulatory Visit: Payer: Self-pay

## 2020-07-17 ENCOUNTER — Ambulatory Visit (INDEPENDENT_AMBULATORY_CARE_PROVIDER_SITE_OTHER): Payer: No Typology Code available for payment source

## 2020-07-17 VITALS — BP 152/89 | HR 91

## 2020-07-17 DIAGNOSIS — O10919 Unspecified pre-existing hypertension complicating pregnancy, unspecified trimester: Secondary | ICD-10-CM

## 2020-07-17 NOTE — Progress Notes (Signed)
Pt here today for BP check after start of Labetalol 200 mg po tid and Nifedipine 60 mg po qd.  Pt denies visual disturbances and headaches.  BP LA 157/90.  Rpt RA 152/89.  Pt reports that she has white coat syndrome.  Pt states that she had one dose of Nifedipine and Labetalol this am around 1115.  Reviewed BP's with Casper Harrison, MD who recommends that pt continue to take Labetalol 200 mg po tid, to increase Nifedipine 60 mg po from once a day to twice a day, and recheck BP in two days.   Provider also recommended that pt get pre-eclampsia labs today.  Pt notified of providers recommendation however pt left without getting labs today.  Pt has an appt scheduled with provider on Friday, 07/20/20, which pt can be evaluated at that time.    Called pt and left message that she left without get labs drawn if she could please come to the office for the lab draw.  Returned pt call and pt informed me that she will be able to come in tomorrow 07/18/20 after 1530 for her lab draw.     Leonette Nutting  07/17/20

## 2020-07-17 NOTE — Progress Notes (Signed)
I reviewed the patient's chart and discussed the care plan. I agree with the documentation and findings in the RN note.    Casper Harrison, MD [facility] Fellow, [facility] for [facility], [facility]

## 2020-07-18 ENCOUNTER — Other Ambulatory Visit: Payer: No Typology Code available for payment source

## 2020-07-19 LAB — COMPREHENSIVE METABOLIC PANEL
ALT: 8 IU/L (ref 0–32)
AST: 18 IU/L (ref 0–40)
Albumin/Globulin Ratio: 1.6 (ref 1.2–2.2)
Albumin: 3.6 g/dL — ABNORMAL LOW (ref 3.8–4.8)
Alkaline Phosphatase: 106 IU/L (ref 44–121)
BUN/Creatinine Ratio: 15 (ref 9–23)
BUN: 6 mg/dL (ref 6–24)
Bilirubin Total: 0.2 mg/dL (ref 0.0–1.2)
CO2: 19 mmol/L — ABNORMAL LOW (ref 20–29)
Calcium: 9.9 mg/dL (ref 8.7–10.2)
Chloride: 102 mmol/L (ref 96–106)
Creatinine, Ser: 0.41 mg/dL — ABNORMAL LOW (ref 0.57–1.00)
Globulin, Total: 2.3 g/dL (ref 1.5–4.5)
Glucose: 80 mg/dL (ref 65–99)
Potassium: 4.1 mmol/L (ref 3.5–5.2)
Sodium: 136 mmol/L (ref 134–144)
Total Protein: 5.9 g/dL — ABNORMAL LOW (ref 6.0–8.5)
eGFR: 127 mL/min/{1.73_m2} (ref >59)

## 2020-07-19 LAB — CBC
Hematocrit: 32.4 % — ABNORMAL LOW (ref 34.0–46.6)
Hemoglobin: 10.6 g/dL — ABNORMAL LOW (ref 11.1–15.9)
MCH: 23 pg — ABNORMAL LOW (ref 26.6–33.0)
MCHC: 32.7 g/dL (ref 31.5–35.7)
MCV: 70 fL — ABNORMAL LOW (ref 79–97)
Platelets: 307 10*3/uL (ref 150–450)
RBC: 4.6 x10E6/uL (ref 3.77–5.28)
RDW: 16.8 % — ABNORMAL HIGH (ref 11.7–15.4)
WBC: 8.6 10*3/uL (ref 3.4–10.8)

## 2020-07-19 LAB — PROTEIN / CREATININE RATIO, URINE
Creatinine, Urine: 9.2 mg/dL
Protein, Ur: <4 mg/dL

## 2020-07-20 ENCOUNTER — Ambulatory Visit (INDEPENDENT_AMBULATORY_CARE_PROVIDER_SITE_OTHER): Payer: No Typology Code available for payment source | Admitting: Family Medicine

## 2020-07-20 ENCOUNTER — Other Ambulatory Visit: Payer: Self-pay

## 2020-07-20 ENCOUNTER — Encounter: Payer: Self-pay | Admitting: Internal Medicine

## 2020-07-20 ENCOUNTER — Ambulatory Visit: Payer: No Typology Code available for payment source | Admitting: Internal Medicine

## 2020-07-20 VITALS — BP 140/82 | HR 83 | Wt 198.8 lb

## 2020-07-20 VITALS — BP 128/82 | HR 84 | Ht 62.0 in | Wt 198.4 lb

## 2020-07-20 DIAGNOSIS — O0990 Supervision of high risk pregnancy, unspecified, unspecified trimester: Secondary | ICD-10-CM | POA: Diagnosis not present

## 2020-07-20 DIAGNOSIS — O10919 Unspecified pre-existing hypertension complicating pregnancy, unspecified trimester: Secondary | ICD-10-CM

## 2020-07-20 DIAGNOSIS — O24415 Gestational diabetes mellitus in pregnancy, controlled by oral hypoglycemic drugs: Secondary | ICD-10-CM

## 2020-07-20 DIAGNOSIS — O24410 Gestational diabetes mellitus in pregnancy, diet controlled: Secondary | ICD-10-CM

## 2020-07-20 DIAGNOSIS — Z98891 History of uterine scar from previous surgery: Secondary | ICD-10-CM

## 2020-07-20 DIAGNOSIS — E0500 Thyrotoxicosis with diffuse goiter without thyrotoxic crisis or storm: Secondary | ICD-10-CM

## 2020-07-20 LAB — T4, FREE: Free T4: 0.77 ng/dL (ref 0.60–1.60)

## 2020-07-20 LAB — POCT GLYCOSYLATED HEMOGLOBIN (HGB A1C): Hemoglobin A1C: 6.3 % — AB (ref 4.0–5.6)

## 2020-07-20 LAB — TSH: TSH: 0.95 u[IU]/mL (ref 0.35–4.50)

## 2020-07-20 LAB — T3, FREE: T3, Free: 2.5 pg/mL (ref 2.3–4.2)

## 2020-07-20 MED ORDER — METFORMIN HCL 500 MG PO TABS: 1000.0000 mg | ORAL_TABLET | Freq: Every day | ORAL | 5 refills | Status: DC

## 2020-07-20 NOTE — Patient Instructions (Signed)
You can take Mucinex, (Allegra, Claritin, Zyrtec-any one of these--they are the same class--same as Benadryl), sudafed, saline and steroid nasal sprays. Do not take Phenyleprine.

## 2020-07-20 NOTE — Progress Notes (Signed)
Patient ID: Tammy Byrd, female   DOB: 12-16-1980, 40 y.o.   MRN: [id number]   This visit occurred during the SARS-CoV-2 public health emergency.  Safety protocols were in place, including screening questions prior to the visit, additional usage of staff PPE, and extensive cleaning of exam room while observing appropriate contact time as indicated for disinfecting solutions.   HPI  Tammy Byrd is a 40 y.o.-year-old female, returning for follow-up for Graves' disease.  Last visit 6 months ago.  She is here with her husband.  Interim history: /Patient is now pregnant almost [redacted] weeks along. EDD 09/01/2020.  She will have a boy.  She also has 3 girls.  Tammy Byrd is feeling well but has leg swelling and also allergies.  Reviewed history: She had 2 UTIs in 07/2016 and started to feel poorly afterwards.  She saw her PCP in 08/2016 for palpitations, fatigue, increased sweating.  TFTs were in the thyrotoxic range.  She was referred to endocrinology.  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 and 5 mg in p.m. in 11/2016.  We also started atenolol 25 mg twice a day but decrease the dose to only once a day in the past due to dizziness.  In 11/2017: we were able to decrease the methimazole to 5 mg twice a day.  We continued atenolol.  In 04/2018: we decreased the methimazole to 5 mg once a day.  She did not return for labs in 1.5 months, as advised.  Upon questioning, she was occasionally missing methimazole doses.  In 10/2018: We decrease methimazole to 2.5 mg daily  In 05/2019: We stopped methimazole completely.  She continues off the medication.  Reviewed her TFTs: Lab Results  Component Value Date   TSH 1.140 04/11/2020   TSH 0.514 02/29/2020   TSH 1.52 01/20/2020   TSH 1.55 11/11/2019   TSH 2.010 11/07/2019    TSH 2.27 08/19/2019   TSH 1.56 07/14/2019   TSH 1.930 06/07/2019   TSH 2.63 11/05/2018   TSH 2.87 04/30/2018   FREET4 0.97 04/11/2020   FREET4 0.91 01/20/2020   FREET4 0.96 11/11/2019   FREET4 1.23 11/07/2019   FREET4 1.00 08/19/2019   FREET4 0.96 07/14/2019   FREET4 1.28 06/07/2019   FREET4 1.04 11/05/2018   FREET4 0.82 04/30/2018   FREET4 0.85 12/11/2017    Pt denies: - feeling nodules in neck - hoarseness - dysphagia - choking - SOB with lying down  No family history of thyroid disease except thyroid cancer in mother.  No FH of thyroid cancer. No h/o radiation tx to head or neck.  No seaweed or kelp. No recent contrast studies. No herbal supplements. No Biotin use. No recent steroids use.   She has a history of HTN/preeclampsia and GDM during previous pregnancy.  Reviewed HbA1c levels -latest was slightly higher: Lab Results  Component Value Date   HGBA1C 6.2 (H) 02/29/2020   HGBA1C 6.0 (H) 11/07/2019   She was started on metformin 500 >> 1000 mg at bedtime (increased today) by high risk OB/GYN.  She checks CBG 4x a day: - am: 101-120 - 2h after b'fast: 91-100 - lunch: n/c - 2h after lunch: 92-100 - dinner: n/c - 2h after dinner: 104-120, 130 (syrup) - bedtime: n/c   ROS: Constitutional: no weight gain/no weight loss, +  fatigue, no subjective hyperthermia, no subjective hypothermia Eyes: no blurry vision, no xerophthalmia ENT: no sore throat, + see HPI Cardiovascular: no CP/no SOB/no palpitations/no leg swelling Respiratory: no cough/no SOB/no wheezing Gastrointestinal: no N/no V/no D/no C/no acid reflux Musculoskeletal: no muscle aches/no joint aches Skin: no rashes, no hair loss Neurological: no tremors/no numbness/no tingling/no dizziness  I reviewed pt's medications, allergies, PMH, social hx, family hx, and changes were documented in the history of present illness. Otherwise, unchanged from my initial visit note.  Past Medical History:  Diagnosis  Date  . 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 GDM (gestational diabetes mellitus)   . History of severe pre-eclampsia    Past Surgical History:  Procedure Laterality Date  . CESAREAN SECTION N/A 09/04/2015   Procedure: CESAREAN SECTION;  Surgeon: Truett Mainland, DO;  Location: [facility];  Service: Obstetrics;  Laterality: N/A;  . NO PAST SURGERIES     Social History   Social History  . Marital status: Single,    Spouse name: N/A  . Number of children: 3   Occupational History  . Dental asstnt   Social History Main Topics  . Smoking status: Never Smoker  . Smokeless tobacco: Never Used  . Alcohol use No  . Drug use: No   Current Outpatient Medications  Medication Sig Dispense Refill  . Alcohol Swabs (ALCOHOL WIPES) 70 % PADS 1 application by Does not apply route in the morning, at noon, in the evening, and at bedtime. 100 each 11  . aspirin EC 81 MG tablet Take 1 tablet (81 mg total) by mouth daily. Swallow whole. 30 tablet 11  . Blood Glucose Monitoring Suppl (ONETOUCH VERIO) w/Device KIT 1 Device by Does not apply route in the morning, at noon, in the evening, and at bedtime. 1 kit 0  . ferrous gluconate (FERGON) 324 MG tablet Take 1 tablet (324 mg total) by mouth daily with breakfast. 90 tablet 3  . glucose blood test strip Use as instructed 100 each 12  . hydrocortisone 1 % lotion Apply 1 application topically 2 (two) times daily. 118 mL 0  . labetalol (NORMODYNE) 200 MG tablet Take 1 tablet (200 mg total) by mouth 3 (three) times daily. 90 tablet 3  . Lancets (ONETOUCH ULTRASOFT) lancets Use as instructed 100 each 12  . metFORMIN (GLUCOPHAGE) 500 MG tablet Take 2 tablets (1,000 mg total) by mouth at bedtime. 60 tablet 5  . NIFEdipine (PROCARDIA XL/NIFEDICAL XL) 60 MG 24 hr tablet Take 1 tablet (60 mg total) by mouth daily. 30 tablet 5  . Prenatal Vit-Fe Fumarate-FA (PRENATAL PO) Take by mouth.     No current facility-administered medications for this  visit.   Allergies  Allergen Reactions  . Latex     BUMPS AND SWELLING   Family History  Problem Relation Age of Onset  . Kidney disease Mother    PE: BP 128/82 (BP Location: Right Arm, Patient Position: Sitting, Cuff Size: Normal)   Pulse 84   Ht 5' 2" (1.575 m)   Wt 198 lb 6.4 oz (90 kg)   LMP 12/06/2019   SpO2 94%   BMI 36.29 kg/m  Wt Readings from Last 3 Encounters:  07/20/20 198 lb 6.4 oz (90 kg)  07/20/20 198 lb 12.8 oz (90.2 kg)  07/13/20 195 lb 4.8 oz (88.6 kg)   Constitutional: overweight, in NAD Eyes: PERRLA, EOMI, no exophthalmos ENT: moist mucous membranes, + mild symmetric thyromegaly, no cervical lymphadenopathy Cardiovascular: RRR, No MRG, +  B pitting LE edema Respiratory: CTA B Gastrointestinal: abdomen soft, NT, ND, BS+ Musculoskeletal: no deformities, strength intact in all 4 Skin: moist, warm, no rashes Neurological: no tremor with outstretched hands, DTR normal in all 4  ASSESSMENT:  1.  Graves' disease  2. H/o gestational diabetes  PLAN:  1. Patient with history of thyrotoxicosis (low TSH, high free thyroid hormones), with thyrotoxic symptoms initially: Weight loss, heat intolerance, hyper defecation, palpitations, anxiety.  TSI antibodies were elevated, pointing towards a diagnosis of Graves' disease.  We did not perform a thyroid uptake and scan. -We initially started her on methimazole 10 mg in a.m. and 5 mg in p.m. and atenolol 25 mg twice a day.  She developed dizziness and atenolol so we decreased the dose to only once daily, which she tolerated well.  Afterwards, she can taper the atenolol to off. -We were able to stop methimazole 05/2019.  She had repeated TFTs afterwards including in 01/2020 in 03/2020 and these were normal. -At today's visit, she continues off methimazole. -She denies thyrotoxic symptoms.  She does have lower extremity swelling, most likely due to venous compression during pregnancy.  Per her report, she recently had urine  proteins checked which were normal. -We will recheck her TFTs today; if they are in the thyrotoxic range, we can restart methimazole, now that she is close to the end of her pregnancy -No signs of Graves' ophthalmopathy: Double vision, blurry vision, eye pain, chemosis -I will see her back in 6 months  2.  Gestational diabetes -She also had this with a previous pregnancy, but did not require insulin -Latest HbA1c 6.2% 02/2020; at today's visit, HbA1c was 6.3%, slightly higher -She was started on metformin 500 mg by high risk OB 07/02/2020 and the dose was increased to 1000 mg today -She tolerates metformin well -At today's visit, sugars are slightly higher than goal in the morning, but I suspect that they will improve after increasing metformin.  We discussed that she can increase the metformin dose further if they do not get lower than 95, which is our fasting target for pregnancy.  We discussed about CBG targets for pregnancy and I wrote them down for her for reference.  Later in the day, sugars are usually at goal.  Component     Latest Ref Rng & Units 07/20/2020  TSH     0.35 - 4.50 uIU/mL 0.95  Triiodothyronine,Free,Serum     2.3 - 4.2 pg/mL 2.5  T4,Free(Direct)     0.60 - 1.60 ng/dL 0.77  Normal TFTs.  Philemon Kingdom, MD PhD [facility] Endocrinology

## 2020-07-20 NOTE — Progress Notes (Signed)
   PRENATAL VISIT NOTE  Subjective:  Tammy Byrd is a 40 y.o. [id number] at [redacted]w[redacted]d being seen today for ongoing prenatal care.  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  Patient reports URI symptoms.  Contractions: Not present. Vag. Bleeding: None.  Movement: Present. Denies leaking of fluid.   The following portions of the patient's history were reviewed and updated as appropriate: allergies, current medications, past family history, past medical history, past social history, past surgical history and problem list.   Objective:   Vitals:   07/20/20 1057 07/20/20 1134  BP: (!) 150/86 140/82  Pulse: 83   Weight: 198 lb 12.8 oz (90.2 kg)     Fetal Status: Fetal Heart Rate (bpm): NST   Movement: Present     General:  Alert, oriented and cooperative. Patient is in no acute distress.  Skin: Skin is warm and dry. No rash noted.   Cardiovascular: Normal heart rate noted  Respiratory: Normal respiratory effort, no problems with respiration noted  Abdomen: Soft, gravid, appropriate for gestational age.  Pain/Pressure: Present     Pelvic: Cervical exam deferred        Extremities: Normal range of motion.     Mental Status: Normal mood and affect. Normal behavior. Normal judgment and thought content.  NST:  Baseline: 130 bpm, Variability: Good {> 6 bpm), Accelerations: Reactive and Decelerations: Absent   Assessment and Plan:  Pregnancy: [id number] at [redacted]w[redacted]d 1. Supervision of high risk pregnancy, antepartum   2. Chronic hypertension affecting pregnancy On labetalol and Procardia She has no interest  in increasing meds Reports BP is well controlled at home Recent growth 74% - Fetal nonstress test  3. Gestational diabetes mellitus (GDM) in third trimester controlled on oral hypoglycemic drug    Fastings are high--increased Metformin to 1000mg  q hs--next iw=s insulin. She is not wanting to start this. - metFORMIN (GLUCOPHAGE) 500 MG tablet; Take 2 tablets (1,000 mg total) by mouth at bedtime.  Dispense: 60 tablet; Refill: 5 - Fetal nonstress test  4. History of cesarean delivery Desires TOLAC--consent signed  Preterm labor symptoms and general obstetric precautions including but not limited to vaginal bleeding, contractions, leaking of fluid and fetal movement were reviewed in detail with the patient. Please refer to After Visit Summary for other counseling recommendations.   Return in about 10 days (around 07/30/2020) for as scheduled; Add weekly NST/BPP & HOB , HRC, needs MD.  Future Appointments  Date Time Provider Department Center  07/20/2020  1:00 PM 09/19/2020, MD [facility] None  07/23/2020  2:30 PM [facility] NURSE [facility]  07/23/2020  2:45 PM [facility] US5 [facility]  07/30/2020  2:15 PM [facility] NST [facility]  07/30/2020  3:15 PM 08/01/2020, MD [facility]    [facility] MD

## 2020-07-20 NOTE — Patient Instructions (Addendum)
Please continue off methimazole for now.  Please stop at the lab.  Targets for sugars during pregnancy: Before meals <95, 1h after a meal <140 2h after a meal <120  Please come back for a follow-up appointment in 6 months.

## 2020-07-23 ENCOUNTER — Ambulatory Visit: Payer: No Typology Code available for payment source | Attending: Obstetrics and Gynecology

## 2020-07-23 ENCOUNTER — Encounter: Payer: Self-pay | Admitting: *Deleted

## 2020-07-23 ENCOUNTER — Other Ambulatory Visit: Payer: Self-pay | Admitting: Obstetrics

## 2020-07-23 ENCOUNTER — Ambulatory Visit: Payer: No Typology Code available for payment source | Admitting: *Deleted

## 2020-07-23 ENCOUNTER — Other Ambulatory Visit: Payer: Self-pay

## 2020-07-23 DIAGNOSIS — D569 Thalassemia, unspecified: Secondary | ICD-10-CM

## 2020-07-23 DIAGNOSIS — O10013 Pre-existing essential hypertension complicating pregnancy, third trimester: Secondary | ICD-10-CM

## 2020-07-23 DIAGNOSIS — O99012 Anemia complicating pregnancy, second trimester: Secondary | ICD-10-CM

## 2020-07-23 DIAGNOSIS — O99281 Endocrine, nutritional and metabolic diseases complicating pregnancy, first trimester: Secondary | ICD-10-CM | POA: Diagnosis not present

## 2020-07-23 DIAGNOSIS — Z3A34 34 weeks gestation of pregnancy: Secondary | ICD-10-CM

## 2020-07-23 DIAGNOSIS — O10913 Unspecified pre-existing hypertension complicating pregnancy, third trimester: Secondary | ICD-10-CM | POA: Diagnosis not present

## 2020-07-23 DIAGNOSIS — Z8759 Personal history of other complications of pregnancy, childbirth and the puerperium: Secondary | ICD-10-CM

## 2020-07-23 DIAGNOSIS — Z862 Personal history of diseases of the blood and blood-forming organs and certain disorders involving the immune mechanism: Secondary | ICD-10-CM

## 2020-07-23 DIAGNOSIS — O0990 Supervision of high risk pregnancy, unspecified, unspecified trimester: Secondary | ICD-10-CM

## 2020-07-23 DIAGNOSIS — E0500 Thyrotoxicosis with diffuse goiter without thyrotoxic crisis or storm: Secondary | ICD-10-CM | POA: Diagnosis not present

## 2020-07-23 DIAGNOSIS — O24415 Gestational diabetes mellitus in pregnancy, controlled by oral hypoglycemic drugs: Secondary | ICD-10-CM

## 2020-07-23 DIAGNOSIS — O09523 Supervision of elderly multigravida, third trimester: Secondary | ICD-10-CM

## 2020-07-23 DIAGNOSIS — O09213 Supervision of pregnancy with history of pre-term labor, third trimester: Secondary | ICD-10-CM

## 2020-07-30 ENCOUNTER — Other Ambulatory Visit: Payer: Self-pay

## 2020-07-30 ENCOUNTER — Ambulatory Visit (INDEPENDENT_AMBULATORY_CARE_PROVIDER_SITE_OTHER): Payer: No Typology Code available for payment source

## 2020-07-30 ENCOUNTER — Ambulatory Visit: Payer: No Typology Code available for payment source

## 2020-07-30 ENCOUNTER — Ambulatory Visit: Payer: No Typology Code available for payment source | Admitting: *Deleted

## 2020-07-30 ENCOUNTER — Ambulatory Visit (INDEPENDENT_AMBULATORY_CARE_PROVIDER_SITE_OTHER): Payer: No Typology Code available for payment source | Admitting: Obstetrics and Gynecology

## 2020-07-30 VITALS — BP 127/75 | HR 80 | Wt 201.7 lb

## 2020-07-30 DIAGNOSIS — O285 Abnormal chromosomal and genetic finding on antenatal screening of mother: Secondary | ICD-10-CM

## 2020-07-30 DIAGNOSIS — O24415 Gestational diabetes mellitus in pregnancy, controlled by oral hypoglycemic drugs: Secondary | ICD-10-CM

## 2020-07-30 DIAGNOSIS — E0500 Thyrotoxicosis with diffuse goiter without thyrotoxic crisis or storm: Secondary | ICD-10-CM

## 2020-07-30 DIAGNOSIS — Z3A35 35 weeks gestation of pregnancy: Secondary | ICD-10-CM

## 2020-07-30 DIAGNOSIS — O10919 Unspecified pre-existing hypertension complicating pregnancy, unspecified trimester: Secondary | ICD-10-CM

## 2020-07-30 DIAGNOSIS — Z8751 Personal history of pre-term labor: Secondary | ICD-10-CM

## 2020-07-30 DIAGNOSIS — O0990 Supervision of high risk pregnancy, unspecified, unspecified trimester: Secondary | ICD-10-CM

## 2020-07-30 DIAGNOSIS — Z98891 History of uterine scar from previous surgery: Secondary | ICD-10-CM

## 2020-07-30 DIAGNOSIS — D573 Sickle-cell trait: Secondary | ICD-10-CM

## 2020-07-30 DIAGNOSIS — Z8759 Personal history of other complications of pregnancy, childbirth and the puerperium: Secondary | ICD-10-CM

## 2020-07-30 DIAGNOSIS — D565 Hemoglobin E-beta thalassemia: Secondary | ICD-10-CM

## 2020-07-30 NOTE — Progress Notes (Signed)
   PRENATAL VISIT NOTE  Subjective:  Tammy Byrd is a 40 y.o. H6P5916 at [redacted]w[redacted]d being seen today for ongoing prenatal care.  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  Patient doing well with no acute concerns today. She reports no complaints.  Contractions: Not present. Vag. Bleeding: None.  Movement: Present. Denies leaking of fluid.   FBS: 107-140 PPS:89-115  The following portions of the patient's history were reviewed and updated as appropriate: allergies, current medications, past family history, past medical history, past social history, past surgical history and problem list. Problem list updated.  Objective:   Vitals:   07/30/20 1505  BP: 127/75  Pulse: 80  Weight: 201 lb 11.2 oz (91.5 kg)    Fetal Status: Fetal Heart Rate (bpm): NST Fundal Height: 36 cm Movement: Present     General:  Alert, oriented and cooperative. Patient is in no acute distress.  Skin: Skin is warm and dry. No rash noted.   Cardiovascular: Normal heart rate noted  Respiratory: Normal respiratory effort, no problems with respiration noted  Abdomen: Soft, gravid, appropriate for gestational age.  Pain/Pressure: Present     Pelvic: Cervical exam deferred        Extremities: Normal range of motion.     Mental Status:  Normal mood and affect. Normal behavior. Normal judgment and thought content.   Assessment and Plan:  Pregnancy: [id number] at [redacted]w[redacted]d  1. Supervision of high risk pregnancy, antepartum Continue routine care, cultures next visit  2. Chronic hypertension affecting  pregnancy Blood pressure well controlled today  3. Gestational diabetes mellitus (GDM) in third trimester controlled on oral hypoglycemic drug Fastings still elevated, pt notes higher carb load at evening meal with rice, pt advised to modify diet.  Postprandial blood sugars within range.  4. [redacted] weeks gestation of pregnancy   5. Graves disease   6. Sickle cell trait (HCC)   7. Hgb E-beta thalassemia (HCC)   8. 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 History of severe pre-eclampsia No s/sx of preeclampsia  10. History of preterm delivery No s/sx of PTL  11. History of cesarean delivery Pt desires TOLAC, form previously signed  Preterm labor symptoms and general obstetric precautions including but not limited to vaginal bleeding, contractions, leaking of fluid and fetal movement were reviewed in detail with the patient.  Please refer to After Visit Summary for other counseling recommendations.   Return in about 1 week (around 08/06/2020) for Weekly HOB, NST/BPP as scheduled, HOB, in person, 36 weeks swabs.   Mariel Aloe, MD Faculty Attending Center for [facility]

## 2020-07-30 NOTE — Progress Notes (Signed)

## 2020-08-06 ENCOUNTER — Other Ambulatory Visit (HOSPITAL_COMMUNITY)
Admission: RE | Admit: 2020-08-06 | Discharge: 2020-08-06 | Disposition: A | Discharge status: Home / Self Care | Payer: No Typology Code available for payment source | Source: Ambulatory Visit | Attending: Obstetrics and Gynecology | Admitting: Obstetrics and Gynecology

## 2020-08-06 ENCOUNTER — Ambulatory Visit (INDEPENDENT_AMBULATORY_CARE_PROVIDER_SITE_OTHER): Payer: No Typology Code available for payment source

## 2020-08-06 ENCOUNTER — Ambulatory Visit (INDEPENDENT_AMBULATORY_CARE_PROVIDER_SITE_OTHER): Payer: No Typology Code available for payment source | Admitting: General Practice

## 2020-08-06 ENCOUNTER — Other Ambulatory Visit: Payer: Self-pay

## 2020-08-06 ENCOUNTER — Ambulatory Visit (INDEPENDENT_AMBULATORY_CARE_PROVIDER_SITE_OTHER): Payer: No Typology Code available for payment source | Admitting: Obstetrics and Gynecology

## 2020-08-06 VITALS — BP 152/76 | HR 74 | Wt 204.0 lb

## 2020-08-06 DIAGNOSIS — O0990 Supervision of high risk pregnancy, unspecified, unspecified trimester: Secondary | ICD-10-CM

## 2020-08-06 DIAGNOSIS — Z3A36 36 weeks gestation of pregnancy: Secondary | ICD-10-CM | POA: Diagnosis not present

## 2020-08-06 DIAGNOSIS — Z8751 Personal history of pre-term labor: Secondary | ICD-10-CM

## 2020-08-06 DIAGNOSIS — O24415 Gestational diabetes mellitus in pregnancy, controlled by oral hypoglycemic drugs: Secondary | ICD-10-CM

## 2020-08-06 DIAGNOSIS — Z8759 Personal history of other complications of pregnancy, childbirth and the puerperium: Secondary | ICD-10-CM

## 2020-08-06 DIAGNOSIS — O10919 Unspecified pre-existing hypertension complicating pregnancy, unspecified trimester: Secondary | ICD-10-CM

## 2020-08-06 DIAGNOSIS — Z98891 History of uterine scar from previous surgery: Secondary | ICD-10-CM

## 2020-08-06 NOTE — Progress Notes (Signed)
Pt informed that the ultrasound is considered a limited OB ultrasound and is not intended to be a complete ultrasound exam.  Patient also informed that the ultrasound is not being completed with the intent of assessing for fetal or placental anomalies or any pelvic abnormalities.  Explained that the purpose of today's ultrasound is to assess for  BPP, presentation, and AFI.  Patient acknowledges the purpose of the exam and the limitations of the study.     Detric Scalisi H RN BSN 08/06/20  

## 2020-08-06 NOTE — Patient Instructions (Signed)
Vaginal Delivery ?Vaginal delivery means that you give birth by pushing your baby out of your birth canal (vagina). Your health care team will help you before, during, and after vaginal delivery. ?Birth experiences are unique for every woman and every pregnancy, and birth experiences vary depending on where you choose to give birth. ?What are the risks and benefits? ?Generally, this is safe. However, problems may occur, including: ?Bleeding. ?Infection. ?Damage to other structures such as vaginal tearing. ?Allergic reactions to medicines. ?Despite the risks, benefits of vaginal delivery include less risk of bleeding and infection and a shorter recovery time compared to a Cesarean delivery. Cesarean delivery, or C-section, is the surgical delivery of a baby. ?What happens when I arrive at the birth center or hospital? ?Once you are in labor and have been admitted into the hospital or birth center, your health care team may: ?Review your pregnancy history and any concerns that you have. ?Talk with you about your birth plan and discuss pain control options. ?Check your blood pressure, breathing, and heartbeat. ?Assess your baby's heartbeat. ?Monitor your uterus for contractions. ?Check whether your bag of water (amniotic sac) has broken (ruptured). ?Insert an IV into one of your veins. This may be used to give you fluids and medicines. ?Monitoring ?Your health care team may assess your contractions (uterine monitoring) and your baby's heart rate (fetal monitoring). You may need to be monitored: ?Often, but not continuously (intermittently). ?All the time or for long periods at a time (continuously). Continuous monitoring may be needed if: ?You are taking certain medicines, such as medicine to relieve pain or make your contractions stronger. ?You have pregnancy or labor complications. ?Monitoring may be done by: ?Placing a special stethoscope or a handheld monitoring device on your abdomen to check your baby's heartbeat  and to check for contractions. ?Placing monitors on your abdomen (external monitors) to record your baby's heartbeat and the frequency and length of contractions. ?Placing monitors inside your uterus through your vagina (internal monitors) to record your baby's heartbeat and the frequency, length, and strength of your contractions. Depending on the type of monitor, it may remain in your uterus or on your baby's head until birth. ?Telemetry. This is a type of continuous monitoring that can be done with external or internal monitors. Instead of having to stay in bed, you are able to move around. ?Physical exam ?Your health care team may perform frequent physical exams. This may include: ?Checking how and where your baby is positioned in your uterus. ?Checking your cervix to determine: ?Whether it is thinning out (effacing). ?Whether it is opening up (dilating). ?What happens during labor and delivery? ?Normal labor and delivery is divided into the following three stages: ?Stage 1 ?This is the longest stage of labor. ?Throughout this stage, you will feel contractions. Contractions generally feel mild, infrequent, and irregular at first. They get stronger, more frequent, and more regular as you move through this stage. You may have contractions about every 2-3 minutes. ?This stage ends when your cervix is completely dilated to 4 inches (10 cm) and completely effaced. ?Stage 2 ?This stage starts once your cervix is completely effaced and dilated and lasts until the delivery of your baby. ?This is the stage where you will feel an urge to push your baby out of your vagina. ?You may feel stretching and burning pain, especially when the widest part of your baby's head passes through the vaginal opening (crowning). ?Once your baby is delivered, the umbilical cord will be clamped and   cut. Timing of cutting the cord will depend on your wishes, your baby's health, and your health care provider's practices. ?Your baby will be  placed on your bare chest (skin-to-skin contact) in an upright position and covered with a warm blanket. If you are choosing to breastfeed, watch your baby for feeding cues, like rooting or sucking, and help the baby to your breast for his or her first feeding. ?Stage 3 ?This stage starts immediately after the birth of your baby and ends after you deliver the placenta. ?This stage may take anywhere from 5 to 30 minutes. ?After your baby has been delivered, you will feel contractions as your body expels the placenta. These contractions also help your uterus get smaller and reduce bleeding. ?What can I expect after labor and delivery? ?After labor is over, you and your baby will be assessed closely until you are ready to go home. Your health care team will teach you how to care for yourself and your baby. ?You and your baby may be encouraged to stay in the same room (rooming in) during your hospital stay. This will help promote early bonding and successful breastfeeding. ?Your uterus will be checked and massaged regularly (fundal massage). ?You may continue to receive fluids and medicines through an IV. ?You will have some soreness and pain in your abdomen, vagina, and the area of skin between your vaginal opening and your anus (perineum). ?If an incision was made near your vagina (episiotomy) or if you had some vaginal tearing during delivery, cold compresses may be placed on your episiotomy or your tear. This helps to reduce pain and swelling. ?It is normal to have vaginal bleeding after delivery. Wear a sanitary pad for vaginal bleeding and discharge. ?Summary ?Vaginal delivery means that you will give birth by pushing your baby out of your birth canal (vagina). ?Your health care team will monitor you and your baby throughout the stages of labor. ?After you deliver your baby, your health care team will continue to assess you and your baby to ensure you are both recovering as expected after delivery. ?This  information is not intended to replace advice given to you by your health care provider. Make sure you discuss any questions you have with your health care provider. ?Document Revised: 01/02/2020 Document Reviewed: 01/02/2020 ?Elsevier Patient Education ? 2022 [employer] ? ?

## 2020-08-06 NOTE — Progress Notes (Signed)
Subjective:  Kenlie Seki is a 40 y.o. [id number] at [redacted]w[redacted]d being seen today for ongoing prenatal care.  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  Patient reports general discomforts of pregnancy.  Contractions: Not present. Vag. Bleeding: None.  Movement: Present. Denies leaking of fluid.   The following portions of the patient's history were reviewed and updated as appropriate: allergies, current medications, past family history, past medical history, past social history, past surgical history and problem list. Problem list updated.  Objective:   Vitals:   08/06/20 1439 08/06/20 1459  BP: (!) 157/93 (!) 152/76  Pulse: 74   Weight: 204 lb (92.5 kg)     Fetal Status: Fetal Heart Rate (bpm): NST   Movement: Present     General:  Alert, oriented and cooperative. Patient is in no acute distress.  Skin: Skin is warm and dry. No rash noted.   Cardiovascular: Normal heart rate noted  Respiratory: Normal respiratory effort, no problems with respiration noted  Abdomen: Soft, gravid, appropriate for gestational age. Pain/Pressure: Present     Pelvic:  Cervical exam performed        Extremities: Normal range of motion.  Edema: Moderate pitting, indentation subsides rapidly  Mental Status: Normal mood and affect. Normal behavior. Normal judgment and thought content.   Urinalysis:      Assessment and Plan:  Pregnancy: [id number] at [redacted]w[redacted]d  1. Supervision of high risk pregnancy, antepartum Labor precautions - Culture, beta strep (group b only) - GC/Chlamydia probe amp ([place])not at [facility]  2.  Gestational diabetes mellitus (GDM) in third trimester controlled on oral hypoglycemic drug Fasting CBG's still elevated but improved with diet. Pt encouraged to work on evening meal  Continue with Metformin Continue with weekly antenatal testing and serial growth scans BPP today 10/10  3. Chronic hypertension affecting pregnancy BP stable See above  4. History of cesarean delivery Desires TOLAC, papers signed  5. History of preterm delivery Stable  6. History of severe pre-eclampsia See above  Preterm labor symptoms and general obstetric precautions including but not limited to vaginal bleeding, contractions, leaking of fluid and fetal movement were reviewed in detail with the patient. Please refer to After Visit Summary for other counseling recommendations.  Return in about 1 week (around 08/13/2020) for OB visit, face to face, MD only.   Hermina Staggers, MD

## 2020-08-07 LAB — GC/CHLAMYDIA PROBE AMP (~~LOC~~) NOT AT ARMC
Chlamydia: NEGATIVE
Comment: NEGATIVE
Comment: NORMAL
Neisseria Gonorrhea: NEGATIVE

## 2020-08-10 LAB — CULTURE, BETA STREP (GROUP B ONLY): Strep Gp B Culture: NEGATIVE

## 2020-08-13 ENCOUNTER — Inpatient Hospital Stay (HOSPITAL_COMMUNITY)
Admission: AD | Admit: 2020-08-13 | Discharge: 2020-08-16 | DRG: 807 | Disposition: A | Discharge status: Home / Self Care | Payer: No Typology Code available for payment source | Attending: Obstetrics & Gynecology | Admitting: Obstetrics & Gynecology

## 2020-08-13 ENCOUNTER — Encounter: Payer: No Typology Code available for payment source | Admitting: Family Medicine

## 2020-08-13 ENCOUNTER — Other Ambulatory Visit: Payer: Self-pay

## 2020-08-13 ENCOUNTER — Inpatient Hospital Stay (HOSPITAL_COMMUNITY): Payer: No Typology Code available for payment source | Admitting: Anesthesiology

## 2020-08-13 ENCOUNTER — Encounter (HOSPITAL_COMMUNITY): Payer: Self-pay | Admitting: Family Medicine

## 2020-08-13 ENCOUNTER — Other Ambulatory Visit: Payer: No Typology Code available for payment source

## 2020-08-13 DIAGNOSIS — O1414 Severe pre-eclampsia complicating childbirth: Secondary | ICD-10-CM

## 2020-08-13 DIAGNOSIS — O99284 Endocrine, nutritional and metabolic diseases complicating childbirth: Secondary | ICD-10-CM | POA: Diagnosis present

## 2020-08-13 DIAGNOSIS — D573 Sickle-cell trait: Secondary | ICD-10-CM | POA: Diagnosis present

## 2020-08-13 DIAGNOSIS — D565 Hemoglobin E-beta thalassemia: Secondary | ICD-10-CM | POA: Diagnosis present

## 2020-08-13 DIAGNOSIS — O09529 Supervision of elderly multigravida, unspecified trimester: Secondary | ICD-10-CM

## 2020-08-13 DIAGNOSIS — O34219 Maternal care for unspecified type scar from previous cesarean delivery: Secondary | ICD-10-CM | POA: Diagnosis present

## 2020-08-13 DIAGNOSIS — I10 Essential (primary) hypertension: Secondary | ICD-10-CM | POA: Diagnosis present

## 2020-08-13 DIAGNOSIS — Z3A37 37 weeks gestation of pregnancy: Secondary | ICD-10-CM

## 2020-08-13 DIAGNOSIS — O9902 Anemia complicating childbirth: Secondary | ICD-10-CM | POA: Diagnosis present

## 2020-08-13 DIAGNOSIS — O24425 Gestational diabetes mellitus in childbirth, controlled by oral hypoglycemic drugs: Secondary | ICD-10-CM | POA: Diagnosis present

## 2020-08-13 DIAGNOSIS — Z8751 Personal history of pre-term labor: Secondary | ICD-10-CM

## 2020-08-13 DIAGNOSIS — O114 Pre-existing hypertension with pre-eclampsia, complicating childbirth: Secondary | ICD-10-CM | POA: Diagnosis present

## 2020-08-13 DIAGNOSIS — E0500 Thyrotoxicosis with diffuse goiter without thyrotoxic crisis or storm: Secondary | ICD-10-CM | POA: Diagnosis present

## 2020-08-13 DIAGNOSIS — O285 Abnormal chromosomal and genetic finding on antenatal screening of mother: Secondary | ICD-10-CM | POA: Diagnosis present

## 2020-08-13 DIAGNOSIS — O0990 Supervision of high risk pregnancy, unspecified, unspecified trimester: Secondary | ICD-10-CM

## 2020-08-13 DIAGNOSIS — O10919 Unspecified pre-existing hypertension complicating pregnancy, unspecified trimester: Secondary | ICD-10-CM | POA: Diagnosis present

## 2020-08-13 DIAGNOSIS — D649 Anemia, unspecified: Secondary | ICD-10-CM | POA: Diagnosis present

## 2020-08-13 DIAGNOSIS — O26893 Other specified pregnancy related conditions, third trimester: Secondary | ICD-10-CM | POA: Diagnosis present

## 2020-08-13 DIAGNOSIS — O24419 Gestational diabetes mellitus in pregnancy, unspecified control: Secondary | ICD-10-CM | POA: Diagnosis present

## 2020-08-13 DIAGNOSIS — Z8759 Personal history of other complications of pregnancy, childbirth and the puerperium: Secondary | ICD-10-CM

## 2020-08-13 DIAGNOSIS — Z20822 Contact with and (suspected) exposure to covid-19: Secondary | ICD-10-CM | POA: Diagnosis present

## 2020-08-13 DIAGNOSIS — O1002 Pre-existing essential hypertension complicating childbirth: Secondary | ICD-10-CM | POA: Diagnosis present

## 2020-08-13 DIAGNOSIS — Z98891 History of uterine scar from previous surgery: Secondary | ICD-10-CM

## 2020-08-13 HISTORY — DX: Anemia, unspecified: D64.9

## 2020-08-13 HISTORY — DX: Gestational diabetes mellitus in pregnancy, unspecified control: O24.419

## 2020-08-13 LAB — GLUCOSE, CAPILLARY
Glucose-Capillary: 115 mg/dL — ABNORMAL HIGH (ref 70–99)
Glucose-Capillary: 120 mg/dL — ABNORMAL HIGH (ref 70–99)

## 2020-08-13 LAB — CBC
HCT: 34.8 % — ABNORMAL LOW (ref 36.0–46.0)
Hemoglobin: 11.3 g/dL — ABNORMAL LOW (ref 12.0–15.0)
MCH: 23 pg — ABNORMAL LOW (ref 26.0–34.0)
MCHC: 32.5 g/dL (ref 30.0–36.0)
MCV: 70.7 fL — ABNORMAL LOW (ref 80.0–100.0)
Platelets: 149 10*3/uL — ABNORMAL LOW (ref 150–400)
RBC: 4.92 MIL/uL (ref 3.87–5.11)
RDW: 16.5 % — ABNORMAL HIGH (ref 11.5–15.5)
WBC: 10 10*3/uL (ref 4.0–10.5)
nRBC: 0 % (ref 0.0–0.2)

## 2020-08-13 LAB — COMPREHENSIVE METABOLIC PANEL
ALT: 12 U/L (ref 0–44)
AST: 25 U/L (ref 15–41)
Albumin: 2.5 g/dL — ABNORMAL LOW (ref 3.5–5.0)
Alkaline Phosphatase: 114 U/L (ref 38–126)
Anion gap: 9 (ref 5–15)
BUN: 10 mg/dL (ref 6–20)
CO2: 20 mmol/L — ABNORMAL LOW (ref 22–32)
Calcium: 8.8 mg/dL — ABNORMAL LOW (ref 8.9–10.3)
Chloride: 105 mmol/L (ref 98–111)
Creatinine, Ser: 0.49 mg/dL (ref 0.44–1.00)
GFR, Estimated: >60 mL/min (ref >60)
Glucose, Bld: 94 mg/dL (ref 70–99)
Potassium: 4.3 mmol/L (ref 3.5–5.1)
Sodium: 134 mmol/L — ABNORMAL LOW (ref 135–145)
Total Bilirubin: 0.6 mg/dL (ref 0.3–1.2)
Total Protein: 5.6 g/dL — ABNORMAL LOW (ref 6.5–8.1)

## 2020-08-13 LAB — RPR: RPR Ser Ql: NONREACTIVE

## 2020-08-13 LAB — RESP PANEL BY RT-PCR (FLU A&B, COVID) ARPGX2
Influenza A by PCR: NEGATIVE
Influenza B by PCR: NEGATIVE
SARS Coronavirus 2 by RT PCR: NEGATIVE

## 2020-08-13 LAB — PROTEIN / CREATININE RATIO, URINE
Creatinine, Urine: 129.22 mg/dL
Protein Creatinine Ratio: 3.57 mg/mg{Cre} — ABNORMAL HIGH (ref 0.00–0.15)
Total Protein, Urine: 461 mg/dL

## 2020-08-13 LAB — TYPE AND SCREEN
ABO/RH(D): A POS
Antibody Screen: NEGATIVE

## 2020-08-13 MED ORDER — OXYTOCIN BOLUS FROM INFUSION
333.0000 mL | Freq: Once | INTRAVENOUS | Status: AC
  Administered 2020-08-13: 333 mL via INTRAVENOUS

## 2020-08-13 MED ORDER — LIDOCAINE HCL (PF) 1 % IJ SOLN
30.0000 mL | INTRAMUSCULAR | Status: AC | PRN
  Administered 2020-08-13: 30 mL via SUBCUTANEOUS
  Filled 2020-08-13: qty 30

## 2020-08-13 MED ORDER — ONDANSETRON HCL 4 MG/2ML IJ SOLN: 4.0000 mg | Freq: Four times a day (QID) | INTRAMUSCULAR | Status: DC | PRN

## 2020-08-13 MED ORDER — TRANEXAMIC ACID-NACL 1000-0.7 MG/100ML-% IV SOLN
1000.0000 mg | INTRAVENOUS | Status: AC
  Administered 2020-08-13: 1000 mg via INTRAVENOUS

## 2020-08-13 MED ORDER — LACTATED RINGERS IV SOLN: INTRAVENOUS | Status: DC

## 2020-08-13 MED ORDER — LABETALOL HCL 5 MG/ML IV SOLN
80.0000 mg | INTRAVENOUS | Status: DC | PRN
  Administered 2020-08-13: 80 mg via INTRAVENOUS
  Filled 2020-08-13: qty 16

## 2020-08-13 MED ORDER — LABETALOL HCL 5 MG/ML IV SOLN: 80.0000 mg | INTRAVENOUS | Status: DC | PRN

## 2020-08-13 MED ORDER — TERBUTALINE SULFATE 1 MG/ML IJ SOLN: 0.2500 mg | Freq: Once | INTRAMUSCULAR | Status: DC | PRN

## 2020-08-13 MED ORDER — PHENYLEPHRINE 40 MCG/ML (10ML) SYRINGE FOR IV PUSH (FOR BLOOD PRESSURE SUPPORT): 80.0000 ug | PREFILLED_SYRINGE | INTRAVENOUS | Status: DC | PRN

## 2020-08-13 MED ORDER — FENTANYL CITRATE (PF) 100 MCG/2ML IJ SOLN
50.0000 ug | INTRAMUSCULAR | Status: DC | PRN
  Administered 2020-08-13: 50 ug via INTRAVENOUS
  Filled 2020-08-13: qty 2

## 2020-08-13 MED ORDER — NIFEDIPINE ER OSMOTIC RELEASE 60 MG PO TB24
60.0000 mg | ORAL_TABLET | Freq: Every day | ORAL | Status: DC
  Administered 2020-08-13 – 2020-08-14 (×2): 60 mg via ORAL
  Filled 2020-08-13: qty 1
  Filled 2020-08-13: qty 2

## 2020-08-13 MED ORDER — ACETAMINOPHEN 325 MG PO TABS: 650.0000 mg | ORAL_TABLET | ORAL | Status: DC | PRN

## 2020-08-13 MED ORDER — SOD CITRATE-CITRIC ACID 500-334 MG/5ML PO SOLN: 30.0000 mL | ORAL | Status: DC | PRN

## 2020-08-13 MED ORDER — TETANUS-DIPHTH-ACELL PERTUSSIS 5-2.5-18.5 LF-MCG/0.5 IM SUSY: 0.5000 mL | PREFILLED_SYRINGE | Freq: Once | INTRAMUSCULAR | Status: DC

## 2020-08-13 MED ORDER — MAGNESIUM SULFATE BOLUS VIA INFUSION
4.0000 g | Freq: Once | INTRAVENOUS | Status: AC
  Administered 2020-08-13: 4 g via INTRAVENOUS
  Filled 2020-08-13: qty 1000

## 2020-08-13 MED ORDER — ONDANSETRON HCL 4 MG/2ML IJ SOLN: 4.0000 mg | INTRAMUSCULAR | Status: DC | PRN

## 2020-08-13 MED ORDER — OXYTOCIN-SODIUM CHLORIDE 30-0.9 UT/500ML-% IV SOLN: 2.5000 [IU]/h | INTRAVENOUS | Status: DC

## 2020-08-13 MED ORDER — OXYTOCIN-SODIUM CHLORIDE 30-0.9 UT/500ML-% IV SOLN
1.0000 m[IU]/min | INTRAVENOUS | Status: DC
Start: 2020-08-13 — End: 2020-08-16
  Filled 2020-08-13: qty 500

## 2020-08-13 MED ORDER — SIMETHICONE 80 MG PO CHEW: 80.0000 mg | CHEWABLE_TABLET | ORAL | Status: DC | PRN

## 2020-08-13 MED ORDER — DIPHENHYDRAMINE HCL 25 MG PO CAPS: 25.0000 mg | ORAL_CAPSULE | Freq: Four times a day (QID) | ORAL | Status: DC | PRN

## 2020-08-13 MED ORDER — IBUPROFEN 600 MG PO TABS
600.0000 mg | ORAL_TABLET | Freq: Four times a day (QID) | ORAL | Status: DC
  Administered 2020-08-13 – 2020-08-16 (×9): 600 mg via ORAL
  Filled 2020-08-13 (×9): qty 1

## 2020-08-13 MED ORDER — ONDANSETRON HCL 4 MG PO TABS: 4.0000 mg | ORAL_TABLET | ORAL | Status: DC | PRN

## 2020-08-13 MED ORDER — MAGNESIUM SULFATE 40 GM/1000ML IV SOLN
2.0000 g/h | INTRAVENOUS | Status: AC
  Administered 2020-08-14: 2 g/h via INTRAVENOUS
  Filled 2020-08-13: qty 1000

## 2020-08-13 MED ORDER — MAGNESIUM SULFATE 40 GM/1000ML IV SOLN
INTRAVENOUS | Status: AC
  Filled 2020-08-13: qty 1000

## 2020-08-13 MED ORDER — MAGNESIUM SULFATE 40 GM/1000ML IV SOLN: 2.0000 g/h | INTRAVENOUS | Status: DC

## 2020-08-13 MED ORDER — LACTATED RINGERS IV SOLN: 500.0000 mL | INTRAVENOUS | Status: DC | PRN

## 2020-08-13 MED ORDER — OXYCODONE-ACETAMINOPHEN 5-325 MG PO TABS: 2.0000 | ORAL_TABLET | ORAL | Status: DC | PRN

## 2020-08-13 MED ORDER — EPHEDRINE 5 MG/ML INJ: 10.0000 mg | INTRAVENOUS | Status: DC | PRN

## 2020-08-13 MED ORDER — COCONUT OIL OIL: 1.0000 "application " | TOPICAL_OIL | Status: DC | PRN

## 2020-08-13 MED ORDER — TRANEXAMIC ACID-NACL 1000-0.7 MG/100ML-% IV SOLN: 1000.0000 mg | Freq: Once | INTRAVENOUS | Status: DC | PRN

## 2020-08-13 MED ORDER — HYDRALAZINE HCL 20 MG/ML IJ SOLN
10.0000 mg | INTRAMUSCULAR | Status: DC | PRN
  Administered 2020-08-13 (×2): 10 mg via INTRAVENOUS
  Filled 2020-08-13 (×2): qty 1

## 2020-08-13 MED ORDER — METHYLERGONOVINE MALEATE 0.2 MG/ML IJ SOLN
INTRAMUSCULAR | Status: AC
  Administered 2020-08-13: 0.2 mg
  Filled 2020-08-13: qty 1

## 2020-08-13 MED ORDER — FENTANYL-BUPIVACAINE-NACL 0.5-0.125-0.9 MG/250ML-% EP SOLN
12.0000 mL/h | EPIDURAL | Status: DC | PRN
  Administered 2020-08-13: 12 mL/h via EPIDURAL
  Filled 2020-08-13: qty 250

## 2020-08-13 MED ORDER — LIDOCAINE HCL (PF) 1 % IJ SOLN
INTRAMUSCULAR | Status: DC | PRN
  Administered 2020-08-13: 3 mL via EPIDURAL
  Administered 2020-08-13: 2 mL via EPIDURAL

## 2020-08-13 MED ORDER — LABETALOL HCL 5 MG/ML IV SOLN
20.0000 mg | INTRAVENOUS | Status: DC | PRN
  Administered 2020-08-15: 20 mg via INTRAVENOUS
  Filled 2020-08-13: qty 4

## 2020-08-13 MED ORDER — DIBUCAINE (PERIANAL) 1 % EX OINT: 1.0000 "application " | TOPICAL_OINTMENT | CUTANEOUS | Status: DC | PRN

## 2020-08-13 MED ORDER — SENNOSIDES-DOCUSATE SODIUM 8.6-50 MG PO TABS
2.0000 | ORAL_TABLET | ORAL | Status: DC
  Administered 2020-08-13 – 2020-08-15 (×3): 2 via ORAL
  Filled 2020-08-13 (×3): qty 2

## 2020-08-13 MED ORDER — METHYLERGONOVINE MALEATE 0.2 MG/ML IJ SOLN: 0.2000 mg | Freq: Once | INTRAMUSCULAR | Status: DC

## 2020-08-13 MED ORDER — HYDRALAZINE HCL 20 MG/ML IJ SOLN: 10.0000 mg | Freq: Once | INTRAMUSCULAR | Status: DC

## 2020-08-13 MED ORDER — WITCH HAZEL-GLYCERIN EX PADS: 1.0000 "application " | MEDICATED_PAD | CUTANEOUS | Status: DC | PRN

## 2020-08-13 MED ORDER — PRENATAL MULTIVITAMIN CH
1.0000 | ORAL_TABLET | Freq: Every day | ORAL | Status: DC
  Administered 2020-08-14 – 2020-08-16 (×3): 1 via ORAL
  Filled 2020-08-13 (×3): qty 1

## 2020-08-13 MED ORDER — LABETALOL HCL 200 MG PO TABS
200.0000 mg | ORAL_TABLET | Freq: Three times a day (TID) | ORAL | Status: DC
  Administered 2020-08-13 (×2): 200 mg via ORAL
  Filled 2020-08-13: qty 1
  Filled 2020-08-13: qty 2

## 2020-08-13 MED ORDER — BENZOCAINE-MENTHOL 20-0.5 % EX AERO
1.0000 "application " | INHALATION_SPRAY | CUTANEOUS | Status: DC | PRN
  Administered 2020-08-15: 1 via TOPICAL
  Filled 2020-08-13 (×2): qty 56

## 2020-08-13 MED ORDER — DIPHENHYDRAMINE HCL 50 MG/ML IJ SOLN: 12.5000 mg | INTRAMUSCULAR | Status: DC | PRN

## 2020-08-13 MED ORDER — HYDRALAZINE HCL 20 MG/ML IJ SOLN: 10.0000 mg | INTRAMUSCULAR | Status: DC | PRN

## 2020-08-13 MED ORDER — ACETAMINOPHEN 325 MG PO TABS
650.0000 mg | ORAL_TABLET | ORAL | Status: DC | PRN
  Administered 2020-08-13: 650 mg via ORAL
  Filled 2020-08-13: qty 2

## 2020-08-13 MED ORDER — LACTATED RINGERS IV SOLN: 500.0000 mL | Freq: Once | INTRAVENOUS | Status: DC

## 2020-08-13 MED ORDER — MEASLES, MUMPS & RUBELLA VAC IJ SOLR: 0.5000 mL | Freq: Once | INTRAMUSCULAR | Status: DC

## 2020-08-13 MED ORDER — LABETALOL HCL 5 MG/ML IV SOLN
20.0000 mg | INTRAVENOUS | Status: DC | PRN
  Administered 2020-08-13: 20 mg via INTRAVENOUS
  Filled 2020-08-13: qty 4

## 2020-08-13 MED ORDER — OXYCODONE-ACETAMINOPHEN 5-325 MG PO TABS: 1.0000 | ORAL_TABLET | ORAL | Status: DC | PRN

## 2020-08-13 MED ORDER — TRANEXAMIC ACID-NACL 1000-0.7 MG/100ML-% IV SOLN
INTRAVENOUS | Status: AC
  Filled 2020-08-13: qty 100

## 2020-08-13 MED ORDER — LABETALOL HCL 5 MG/ML IV SOLN
40.0000 mg | INTRAVENOUS | Status: DC | PRN
  Administered 2020-08-13: 40 mg via INTRAVENOUS
  Filled 2020-08-13: qty 8

## 2020-08-13 MED ORDER — LABETALOL HCL 5 MG/ML IV SOLN
40.0000 mg | INTRAVENOUS | Status: DC | PRN
  Administered 2020-08-15: 40 mg via INTRAVENOUS
  Filled 2020-08-13: qty 8

## 2020-08-13 NOTE — Progress Notes (Addendum)
Labor Progress Note Tammy Byrd is a 40 y.o. [id number] at [redacted]w[redacted]d presented for IOL secondary to severe preeclampsia superimposed on cHTN S: Comfortable with epidural. Denies headache, vision changes. Discussed AROM, patient amenable.  O:  BP (!) 153/75   Pulse 78   Temp 98.9 F (37.2 C) (Oral)   Resp 16   Ht 5\' 2"  (1.575 m)   Wt 91.8 kg   LMP 12/06/2019   SpO2 97%   BMI 37.00 kg/m  EFM: baseline 125/moderate variability/+accels/no decels Toco: every 2-3 minutes  CVE: Dilation: 8.5 Effacement (%): 90 Cervical Position: Posterior, Middle Station: 0 Presentation: Vertex Exam by:: Dr. [email or web address] Tammy Byrd   A&P: 40 y.o. 41 [redacted]w[redacted]d IOL for severe preeclampsia superimposed on cHTN, TOLAC #TOLAC/SOL: Active, progressing well. Contracting well on her own, now s/p AROM with moderate meconium (1759). #Superimposed severe preeclampsia on cHTN: On magnesium since 1045. Intermittent mild range pressures, asymptomatic. On antihypertensive protocol. Continue home labetalol 200 mg TID and nifedipine 60 mg daily. #A2GDM: on metformin, continue CBG q2 checks. Most recent CBG 120. #Pain: epidural in place #FWB: category I #GBS negative #Graves disease: recent thyroid labs wnl #sickle cell trait/Hgb E-beta thalassemia: Hgb 11.3 on admission  [redacted]w[redacted]d, MD 6:15 PM   GME ATTESTATION:  I saw and evaluated the patient. I agree with the findings and the plan of care as documented in the resident's note.  Littie Deeds, MD OB Fellow, Faculty [facility], Center for [facility] 08/13/2020 7:16 PM

## 2020-08-13 NOTE — Progress Notes (Addendum)
Patient ID: Tammy Byrd, female   DOB: 01/19/1981, 40 y.o.   MRN: [id number]  Labor Progress Note Tammy Byrd is a 40 y.o. L2X5170 with IUP at [redacted]w[redacted]d presented for IOL given severe range blood pressures measured (190/100 in MAU) with underlying chronic HTN.   S: Patient reports that she just received pain medication notes that she feels contractions q2-3 minutes. She feels fetal movements. Denies blurry vision or headaches. No concerns at this time.   O:  BP (!) 149/83   Pulse 78   Temp 98.3 F (36.8 C) (Oral)   Resp 18   Ht 5\' 2"  (1.575 m)   Wt 91.8 kg   LMP 12/06/2019   SpO2 98%   BMI 37.00 kg/m  EFM: Baseline 135 bpm / minimal to moderate variability / no accels present / intermittent variable decelerations have resolved with position change.  Toco: contractions q2-3 minutes   CVE: Dilation: 4 Effacement (%): 80 Cervical Position: Posterior, Middle Station: -2 Presentation: Vertex Exam by:: [email or web address], rnc   A&P: 40 y.o. [phone] [redacted]w[redacted]d here for IOL for super-imposed pre-eclampsia with severe features on chronic hypertension. #Labor: CVE at 4/80/-2 , contracting every 2-3 minutes on her own. Discussed potential augmentation with pitocin if labor not progressing. #Pain: IV pain medicine PRN. Patient elects to proceed with epidural. #FWB: Category 2 tracing. Overall reassuring due to moderate variability. Will continue to monitor closely.  #GBS negative  #Superimposed pre-eclampsia with severe features on chronic HTN. Patient was continued on home anti-hypertensives. Patient is S/P 140 mg IV labetalol, 20 mg IV hydralazine and 60 mg oral nifedipine. CBC within normal limits. LFTs normal. UPC ratio 3.57. Urine protein elevated at 461. Magnesium bolus was started at 1045. Patient is asymptomatic currently. Will continue to monitor closely and continue IV antihypertensives for blood pressures >160/110.  #H/o Cesarean section, desires TOLAC. Patient counseled extensively on admission.  Consent signed in chart from 07/02/20.  #A2GDM: Patient on Metformin. Will continue to receive q4hr BG while in latent labor. BG level was 115 at 1259 pm.   #Graves disease- recent TSH/Free T3/T4 were within normal limits  #Sickle cell trait/Hgb E-beta thalassemia. Admission Hgb 11.3 g/dL. Will continue to monitor.   07/04/20, Student-PA 1:18 PM   Attestation of Supervision of Student:  I confirm that I have verified the information documented in the physician assistant student's note and that I have also personally performed the history, physical exam and all medical decision making activities.  I have verified that all services and findings are accurately documented in this student's note; and I agree with management and plan as outlined in the documentation. I have also made any necessary editorial changes.  Edmon Crape, MD [facility], [facility] 08/13/2020 2:49 PM

## 2020-08-13 NOTE — Anesthesia Postprocedure Evaluation (Signed)
Anesthesia Post Note  Patient: Tammy Byrd  Procedure(s) Performed: AN AD HOC LABOR EPIDURAL     Patient location during evaluation: Mother Baby Anesthesia Type: Epidural Level of consciousness: awake and alert Pain management: pain level controlled Vital Signs Assessment: post-procedure vital signs reviewed and stable Respiratory status: spontaneous breathing, nonlabored ventilation and respiratory function stable Cardiovascular status: stable Postop Assessment: no headache, no backache and epidural receding Anesthetic complications: no   No notable events documented.  Last Vitals:  Vitals:   08/13/20 2004 08/13/20 2016  BP: 104/88 135/86  Pulse: 81 82  Resp:    Temp:    SpO2:      Last Pain:  Vitals:   08/13/20 1429  TempSrc: Oral  PainSc: 0-No pain   Pain Goal:                   Emmory Solivan

## 2020-08-13 NOTE — Anesthesia Procedure Notes (Addendum)
Epidural Patient location during procedure: OB Start time: 08/13/2020 1:50 PM End time: 08/13/2020 2:10 PM  Staffing Anesthesiologist: Lewie Loron, MD Performed: anesthesiologist   Preanesthetic Checklist Completed: patient identified, IV checked, risks and benefits discussed, monitors and equipment checked, pre-op evaluation and timeout performed  Epidural Patient position: sitting Prep: DuraPrep and site prepped and draped Patient monitoring: heart rate Approach: midline Location: L3-L4 Injection technique: LOR air and LOR saline  Needle:  Needle type: Tuohy  Needle gauge: 17 G Needle length: 9 cm Needle insertion depth: 6 cm Catheter type: closed end flexible Catheter size: 19 Gauge Catheter at skin depth: 11 cm Test dose: negative  Assessment Sensory level: T8 Events: blood not aspirated, injection not painful, no injection resistance, no paresthesia and negative IV test  Additional Notes 1 x attempt at ~L2/3, LOR, but paresthesia. Needle repositioned but no discernable LOR. Repositioned again. Very faint LOR, threaded catheter and fluid noted out of the catheter. Pulled catheter out and slowly withdrew needle, and it fluid did eventually drip slowly from the needle. Needle fully removed. Pt complained of faint headache at that point. Moved to L3/4 with appropriate LOR and catheter threaded without issue. I discussed this with the patient and explained that we would watch closely and that another procedure might be necessary if after she delivers if she continues to have a headache. Reason for block:procedure for pain

## 2020-08-13 NOTE — MAU Provider Note (Signed)
History     CSN: [id number]  Arrival date and time: 08/13/20 0751   Event Date/Time   First Provider Initiated Contact with Patient 08/13/20 0830      Chief Complaint  Patient presents with   Contractions   HPI 40 yo [id number] at 4w2dwith pregnancy complicated by h/o prior c/s, CHTN, GDM on metformin, h/o severe pre-eclampsia, hyperthyroidism. Patient is on labetalol and procardia. She presents for labor evaluation with contractions that started around 6am. She did not take her medications this morning. Denies headache, blurring vision, decreased fetal movement.   OB History     Gravida  4   Para  3   Term  1   Preterm  2   AB  0   Living  3      SAB  0   IAB  0   Ectopic  0   Multiple  0   Live Births  3        Obstetric Comments  C/s for breech         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    GDM (gestational diabetes mellitus)    Gestational diabetes    History of severe pre-eclampsia    Pregnancy induced hypertension     Past Surgical History:  Procedure Laterality Date   CESAREAN SECTION N/A 09/04/2015   Procedure: CESAREAN SECTION;  Surgeon: JTruett Mainland DO;  Location: [facility]  Service: Obstetrics;  Laterality: N/A;   NO PAST SURGERIES      Family History  Problem Relation Age of Onset   Kidney disease Mother    Heart disease Father     Social History   Tobacco Use   Smoking status: Never   Smokeless tobacco: Never  Vaping Use   Vaping Use: Never used  Substance Use Topics   Alcohol use: No   Drug use: No    Allergies:  Allergies  Allergen Reactions   Latex Rash    BUMPS AND SWELLING  "sometimes"    Medications Prior to Admission  Medication Sig Dispense Refill Last Dose   acetaminophen (TYLENOL) 325 MG tablet Take 650 mg by mouth every 6 (six) hours as needed.   Past Month   aspirin EC 81 MG tablet Take 1 tablet (81 mg total) by mouth daily.  Swallow whole. 30 tablet 11 Past Week   ferrous gluconate (FERGON) 324 MG tablet Take 1 tablet (324 mg total) by mouth daily with breakfast. 90 tablet 3 08/12/2020   labetalol (NORMODYNE) 200 MG tablet Take 1 tablet (200 mg total) by mouth 3 (three) times daily. 90 tablet 3 08/12/2020   metFORMIN (GLUCOPHAGE) 500 MG tablet Take 2 tablets (1,000 mg total) by mouth at bedtime. 60 tablet 5 08/12/2020   NIFEdipine (PROCARDIA XL/NIFEDICAL XL) 60 MG 24 hr tablet Take 1 tablet (60 mg total) by mouth daily. 30 tablet 5 08/12/2020   Prenatal Vit-Fe Fumarate-FA (PRENATAL PO) Take by mouth.   08/12/2020   Alcohol Swabs (ALCOHOL WIPES) 70 % PADS 1 application by Does not apply route in the morning, at noon, in the evening, and at bedtime. 100 each 11    Blood Glucose Monitoring Suppl (ONETOUCH VERIO) w/Device KIT 1 Device by Does not apply route in the morning, at noon, in the evening, and at bedtime. 1 kit 0    glucose blood test strip Use as instructed 100 each 12    hydrocortisone  1 % lotion Apply 1 application topically 2 (two) times daily. 118 mL 0 More than a month   Lancets (ONETOUCH ULTRASOFT) lancets Use as instructed 100 each 12     Review of Systems Physical Exam   Blood pressure (!) 171/98, pulse 66, temperature 98.3 F (36.8 C), temperature source Oral, resp. rate 18, height 5' 2" (1.575 m), weight 91.8 kg, last menstrual period 12/06/2019, SpO2 99 %, currently breastfeeding.  Physical Exam Vitals and nursing note reviewed.  Constitutional:      Appearance: Normal appearance.  HENT:     Head: Normocephalic and atraumatic.  Cardiovascular:     Rate and Rhythm: Normal rate and regular rhythm.  Pulmonary:     Effort: Pulmonary effort is normal.  Neurological:     Mental Status: She is alert.  Psychiatric:        Mood and Affect: Mood normal.        Behavior: Behavior normal.        Thought Content: Thought content normal.    MAU Course  Procedures NST: 140s, mod variability, no accel.  Variable deceleration.   MDM Labetalol series done, gave oral home medications as well. At this point, I'm uncertain whether this is severe preeclampsia superimposed on CHTN or whether this is because she did not take her home medications. However, after a couple variable decels, will induce.  Assessment and Plan  To L&D  Truett Mainland 08/13/2020, 8:30 AM

## 2020-08-13 NOTE — H&P (Addendum)
OBSTETRIC ADMISSION HISTORY AND PHYSICAL  Tammy Byrd is a 40 y.o. female [phone] with IUP at [redacted]w[redacted]d by 21w [place] presenting from MAU for contractions and vaginal bleeding, admitted for IOL given severe range BP with underlying cHTN (initial BP 190/100). She had not take her home antihypertensives this morning, so her home medications were given in the MAU. She also received a total of 140 mg IV labetalol and 10 mg IV hydralazine in the MAU with improvement to BP 156/92. She reports +FMs, No LOF, no VB, no blurry vision, headaches or peripheral edema, and RUQ pain.  She plans on breast and bottle feeding. She request OP Nexplanon for birth control. 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 Graves disease Sickle cell trait Hgb E-beta thalassemia History of preterm delivery History of cesarean delivery - desires TOLAC, was for breech presentation in her third pregnancy AMA  Past Medical History: 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    GDM (gestational diabetes mellitus)    Gestational diabetes    History of severe pre-eclampsia    Pregnancy induced hypertension     Past Surgical History: Past Surgical History:  Procedure Laterality Date   CESAREAN SECTION N/A 09/04/2015   Procedure: CESAREAN SECTION;  Surgeon: Truett Mainland, DO;  Location: [place];  Service: Obstetrics;  Laterality: N/A;   NO PAST SURGERIES      Obstetrical History: OB History     Gravida  4   Para  3   Term  1   Preterm  2   AB  0   Living  3      SAB  0   IAB  0   Ectopic  0   Multiple  0   Live Births  3        Obstetric Comments  C/s for  breech         Social History Social History   Socioeconomic History   Marital status: Single    Spouse name: Not on file   Number of children: 3   Years of education: Not on file   Highest education level: Not on file  Occupational History   Not on file  Tobacco Use   Smoking status: Never   Smokeless tobacco: Never  Vaping Use   Vaping Use: Never used  Substance and Sexual Activity   Alcohol use: No   Drug use: No   Sexual activity: Not Currently  Other Topics Concern   Not on file  Social History Narrative   Not on file   Social Determinants of Health   Financial Resource Strain: Not on file  Food Insecurity: No Food Insecurity   Worried About Running Out of Food in the Last Year: Never true   Ran Out of Food in the Last Year: Never true  Transportation Needs: No Transportation Needs   Lack of Transportation (Medical): No   Lack of Transportation (Non-Medical): No  Physical Activity: Not on file  Stress: Not on file  Social Connections: Not on file    Family History: Family History  Problem Relation Age of Onset   Kidney disease Mother  Heart disease Father     Allergies: Allergies  Allergen Reactions   Latex Rash    BUMPS AND SWELLING  "sometimes"    Medications Prior to Admission  Medication Sig Dispense Refill Last Dose   acetaminophen (TYLENOL) 325 MG tablet Take 650 mg by mouth every 6 (six) hours as needed.   Past Month   aspirin EC 81 MG tablet Take 1 tablet (81 mg total) by mouth daily. Swallow whole. 30 tablet 11 Past Week   ferrous gluconate (FERGON) 324 MG tablet Take 1 tablet (324 mg total) by mouth daily with breakfast. 90 tablet 3 08/12/2020   labetalol (NORMODYNE) 200 MG tablet Take 1 tablet (200 mg total) by mouth 3 (three) times daily. 90 tablet 3 08/12/2020   metFORMIN (GLUCOPHAGE) 500 MG tablet Take 2 tablets (1,000 mg total) by mouth at bedtime. 60 tablet 5 08/12/2020   NIFEdipine (PROCARDIA XL/NIFEDICAL XL) 60 MG 24 hr tablet  Take 1 tablet (60 mg total) by mouth daily. 30 tablet 5 08/12/2020   Prenatal Vit-Fe Fumarate-FA (PRENATAL PO) Take by mouth.   08/12/2020   Alcohol Swabs (ALCOHOL WIPES) 70 % PADS 1 application by Does not apply route in the morning, at noon, in the evening, and at bedtime. 100 each 11    Blood Glucose Monitoring Suppl (ONETOUCH VERIO) w/Device KIT 1 Device by Does not apply route in the morning, at noon, in the evening, and at bedtime. 1 kit 0    glucose blood test strip Use as instructed 100 each 12    hydrocortisone 1 % lotion Apply 1 application topically 2 (two) times daily. 118 mL 0 More than a month   Lancets (ONETOUCH ULTRASOFT) lancets Use as instructed 100 each 12      Review of Systems   All systems reviewed and negative except as stated in HPI  Blood pressure (!) 156/92, pulse 71, temperature 98.3 F (36.8 C), temperature source Oral, resp. rate 18, height 5' 2"  (1.575 m), weight 91.8 kg, last menstrual period 12/06/2019, SpO2 98 %, currently breastfeeding. General appearance: alert, cooperative, appears stated age, and no distress Lungs: non-labored respirations Heart: regular rate Abdomen: gravid Presentation: cephalic Fetal monitoringBaseline: 140 bpm, moderate variability + accelerations, intermittent variable decelerations Uterine activity: contractions q2-4 minutes Dilation: 3 Effacement (%): 70 Station: -3 Exam by:: jolynn   Prenatal labs: ABO, Rh: A/Positive/-- (02/23 1140) Antibody: Negative (02/23 1140) Rubella: 29.20 (02/23 1140) RPR: Non Reactive (04/27 0935)  HBsAg: Negative (02/23 1140)  HIV: Non Reactive (04/27 0935)  GBS: Negative/-- (06/20 1628)  2 hr Glucola 110/230/164 Genetic screening  NIPS low risk, AFP neg Anatomy US nl  Prenatal Transfer Tool  Maternal Diabetes: Yes:  Diabetes Type:  Insulin/Medication controlled Genetic Screening: Normal Maternal Ultrasounds/Referrals: Normal Fetal Ultrasounds or other Referrals:  None Maternal  Substance Abuse:  No Significant Maternal Medications:  None Significant Maternal Lab Results: Group B Strep negative  Results for orders placed or performed during the hospital encounter of 08/13/20 (from the past 24 hour(s))  CBC   Collection Time: 08/13/20  8:45 AM  Result Value Ref Range   WBC 10.0 4.0 - 10.5 K/uL   RBC 4.92 3.87 - 5.11 MIL/uL   Hemoglobin 11.3 (L) 12.0 - 15.0 g/dL   HCT 34.8 (L) 36.0 - 46.0 %   MCV 70.7 (L) 80.0 - 100.0 fL   MCH 23.0 (L) 26.0 - 34.0 pg   MCHC 32.5 30.0 - 36.0 g/dL   RDW 16.5 (H) 11.5 - 15.5 %  Platelets 149 (L) 150 - 400 K/uL   nRBC 0.0 0.0 - 0.2 %    Patient Active Problem List   Diagnosis Date Noted   Supervision of high risk pregnancy, antepartum 04/11/2020   Anemia 07/28/2018   Graves disease 11/25/2016   Gestational diabetes 10/16/2015   History of cesarean delivery 09/05/2015   History of preterm delivery 09/04/2015   History of severe pre-eclampsia 08/16/2015   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 [id number] at 27w2dhere for IOL for super-imposed pre-eclampsia with severe features on chronic hypertension.  #Labor: SVE 3/70/-3 in MAU, contracting every 2-4 minutes on her own, will recheck in 4 hours and consider augmentation if not progressing with pitocin/amniotomy. #Pain: Desires epidural, can get when ready #FWB: Cat II, moderate variability with + accelerationss, intermittent variable decelerations but overall reassuring #ID:  GBS negative #MOF: both #MOC: OP Nexplanon #Circ:  Does not desire  #Superimposed pre-eclampsia with severe features on chronic HTN- continued home antihypertensives. Severe features based on severe range blood pressures, magnesium started per protocol. S/p 1429mIV labetalol, given additional 10 mg IV  hydralazine upon arrival to L&D for a total 20 mg IV hydralazine. Continue IV antihypertensives for BP > 160/110. CBC/CMP within normal limits, UPC ratio 3.5. Asymptomatic, monitor closely.  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 Glucose 94 on CMP on admission.  # H/o Cesarean section, desires TOLAC- counseled extensively on admission. Counseled about risk of TOLAC including uterine rupture, fetal death, risk of maternal death and need for emergent RLTCS, hemorrhage.  Pt. Counseled about future implications, chance of success with non-recurring reason for Primary CS. Risks of repeat CS also reviewed including bleeding, abnormal placentation, infection, longer recovery, injury to bowel or bladder. Pt aware and elects for TOLAC.  #Graves disease- recent TSH/free T3/T4 within normal limits. #Sickle cell trait/ Hgb E-beta thalassemia- admission Hgb 11.3  Tammy ButtonMD  08/13/2020, 9:57 AM  Attestation of Supervision of Resident:  I confirm that I have verified the information documented in the resident's note and that I have also personally performed the history, physical exam and all medical decision making activities.  I have verified that all services and findings are accurately documented in this note; and I agree with management and plan as outlined in the documentation. I have also made any necessary editorial changes.   MaLenoria ChimeMD [facility] 08/13/2020 12:43 PM   Attestation of Attending Supervision of Obstetric Fellow: Evaluation and management procedures were performed by the Obstetric Fellow under my supervision and collaboration.  I have reviewed the Obstetric Fellow's note and chart, and I agree with the management and plan except as noted below.  Initially unclear whether or not PreE vs uncontrolled BP due to not taking meds, however given severe pressures and marked increase in UPCR better safe and Mg was  started. Progressing well on her own since arrival, anticipate NSVD.    MaClarnce FlockMD Family Medicine Attending, [facility] 08/13/2020 3:55 PM

## 2020-08-13 NOTE — Lactation Note (Signed)
This note was copied from a baby's chart. Lactation Consultation Note  Patient Name: Tammy Byrd [id number] Date: 08/13/2020 Reason for consult: Maternal endocrine disorder;L&D Initial assessment;Early term 37-38.6wks;Other (Comment) (AMA) Age:40 hours  Visited with mom of 1 hour old ETI female, she's a P4 and experienced BF. LC assisted with hand expression (colostrum noted) and latch, baby able to latch but required repositioning a few times because mom kept switching from cross cradle to typical cradle.  Baby still nursing when exiting the room at the 8 minutes mark. Reviewed normal newborn behavior, feeding cues, size of baby's stomach, newborn hypoglycemia and STS care. Parents aware of the availability of donor milk in the unit in case baby needs further supplementation due to ETI and IDM status.  Feeding plan:  Encouraged mom to feed baby STS 8-12 times/24 hours or sooner if feeding cues are present Hand expression and finger/spoon feeding were also encouraged Parents will try to do as much STS care as possible  FOB present and supportive. Parents reported all questions and concerns were answered, they're both aware of LC OP services and will call PRN.   Maternal Data Has patient been taught Hand Expression?: Yes Does the patient have breastfeeding experience prior to this delivery?: Yes How long did the patient breastfeed?: BF her other kids for 1 year  Feeding Mother's Current Feeding Choice: Breast Milk  LATCH Score Latch: Grasps breast easily, tongue down, lips flanged, rhythmical sucking.  Audible Swallowing: A few with stimulation  Type of Nipple: Everted at rest and after stimulation  Comfort (Breast/Nipple): Soft / non-tender  Hold (Positioning): Assistance needed to correctly position infant at breast and maintain latch.  LATCH Score: 8   Lactation Tools Discussed/Used    Interventions Interventions: Breast feeding basics reviewed;Assisted with latch;Skin to  skin;Breast massage;Hand express;Breast compression;Adjust position;Education  Discharge WIC Program: No  Consult Status Consult Status: Follow-up Date: 08/14/20 Follow-up type: In-patient    Ilan Kahrs Venetia Constable 08/13/2020, 8:43 PM

## 2020-08-13 NOTE — Anesthesia Preprocedure Evaluation (Signed)
Anesthesia Evaluation  Patient identified by MRN, date of birth, ID band Patient awake    Reviewed: Allergy & Precautions, H&P , Patient's Chart, lab work & pertinent test results, reviewed documented beta blocker date and time   Airway Mallampati: II  TM Distance: >3 FB Neck ROM: full    Dental no notable dental hx. (+) Dental Advisory Given   Pulmonary    Pulmonary exam normal breath sounds clear to auscultation       Cardiovascular Exercise Tolerance: Good hypertension, Pt. on medications and Pt. on home beta blockers  Rhythm:regular Rate:Normal     Neuro/Psych    GI/Hepatic   Endo/Other  diabetes, Gestational  Renal/GU      Musculoskeletal   Abdominal   Peds  Hematology  (+) anemia ,   Anesthesia Other Findings   Reproductive/Obstetrics (+) Pregnancy                             Anesthesia Physical  Anesthesia Plan  ASA: 3  Anesthesia Plan: Epidural   Post-op Pain Management:    Induction:   PONV Risk Score and Plan:   Airway Management Planned:   Additional Equipment:   Intra-op Plan:   Post-operative Plan:   Informed Consent: I have reviewed the patients History and Physical, chart, labs and discussed the procedure including the risks, benefits and alternatives for the proposed anesthesia with the patient or authorized representative who has indicated his/her understanding and acceptance.       Plan Discussed with:   Anesthesia Plan Comments: ( )        Anesthesia Quick Evaluation

## 2020-08-13 NOTE — MAU Note (Signed)
Contractions started 2 hrs ago, are every now, mild.  Had some bloody "jelly" stuff, never had that before, so came in.

## 2020-08-13 NOTE — Discharge Summary (Deleted)
Postpartum Discharge Summary     Patient Name: Tammy Byrd DOB: [birth year] MRN: [id number]  Date of admission: 08/13/2020 Delivery date:08/13/2020  Delivering provider: Zola Button  Date of discharge: 08/15/2020  Admitting diagnosis: Chronic hypertension [I10] Intrauterine pregnancy: [redacted]w[redacted]d    Secondary diagnosis:  Active Problems:   Chronic hypertension affecting pregnancy with superimposed preeclampsia    Sickle cell trait (HCC)   AMA (advanced maternal age) multigravida 35+   Hgb E[place]beta thalassemia [place]   Hemoglobin E trait in mother in antepartum period   History of severe pre-eclampsia   History of preterm delivery   History of cesarean delivery   GDMA2   Graves disease   Anemia   Supervision of high risk pregnancy, antepartum   Additional problems: none    Discharge diagnosis: Term Pregnancy Delivered, Preeclampsia (severe), CHTN, GDM A2, and Anemia                                              Post partum procedures: none Augmentation: AROM Complications: None  Hospital course: Onset of Labor With Vaginal Delivery      40y.o. yo [id number] 343w2das admitted in Latent Labor on 08/13/2020. Patient had an uncomplicated labor course as follows:  Membrane Rupture Time/Date: 6:00 PM ,08/13/2020   Delivery Method:Vaginal, Spontaneous  Episiotomy: None  Lacerations:  2nd degree;Labial  Patient received IV Magnesium x 24hr due to preeclampsia.  BP was managed with procardia and titrated as needed. She is ambulating, tolerating a regular diet, passing flatus, and urinating well. Patient is discharged home in stable condition on 08/15/20.  Newborn Data: Birth date:08/13/2020  Birth time:7:40 PM  Gender:Female  Living status:Living  Apgars:9 ,9  Weight:3300 g   Magnesium Sulfate received: Yes: Seizure prophylaxis BMZ received: No Rhophylac:N/A MMR:N/A T-DaP:Given prenatally Flu: Yes Transfusion:No  Physical exam  Vitals:   08/15/20 0400 08/15/20 0731 08/15/20 0750  08/15/20 0818  BP: 139/74 (!) 165/92 (!) 163/87 (!) 143/78  Pulse: 67 69 75 62  Resp: 18 18    Temp: 98.5 F (36.9 C) 98.4 F (36.9 C)    TempSrc: Oral Oral    SpO2:  98% 97%   Weight:      Height:       General: alert, cooperative, and no distress Lochia: appropriate Uterine Fundus: firm Incision: N/A DVT Evaluation: No evidence of DVT seen on physical exam. Labs: Lab Results  Component Value Date   WBC 17.5 (H) 08/14/2020   HGB 9.7 (L) 08/14/2020   HCT 29.6 (L) 08/14/2020   MCV 70.0 (L) 08/14/2020   PLT 167 08/14/2020   CMP Latest Ref Rng & Units 08/14/2020  Glucose 70 - 99 mg/dL 111(H)  BUN 6 - 20 mg/dL 5(L)  Creatinine 0.44 - 1.00 mg/dL 0.55  Sodium 135 - 145 mmol/L 133(L)  Potassium 3.5 - 5.1 mmol/L 4.5  Chloride 98 - 111 mmol/L 102  CO2 22 - 32 mmol/L 22  Calcium 8.9 - 10.3 mg/dL 7.5(L)  Total Protein 6.5 - 8.1 g/dL 5.4(L)  Total Bilirubin 0.3 - 1.2 mg/dL 0.4  Alkaline Phos 38 - 126 U/L 94  AST 15 - 41 U/L 23  ALT 0 - 44 U/L 11   Edinburgh Score: Edinburgh Postnatal Depression Scale Screening Tool 08/15/2020  I have been able to laugh and see the funny side of things. 0  I have looked forward with enjoyment to things. 0  I have blamed myself unnecessarily when things went wrong. 0  I have been anxious or worried for no good reason. 0  I have felt scared or panicky for no good reason. 0  Things have been getting on top of me. 0  I have been so unhappy that I have had difficulty sleeping. 0  I have felt sad or miserable. 0  I have been so unhappy that I have been crying. 0  The thought of harming myself has occurred to me. 0  Edinburgh Postnatal Depression Scale Total 0     After visit meds:  Allergies as of 08/15/2020       Reactions   Latex Rash   BUMPS AND SWELLING  "sometimes"        Medication List     STOP taking these medications    Alcohol Wipes 70 % Pads   aspirin EC 81 MG tablet   glucose blood test strip   labetalol 200 MG  tablet Commonly known as: NORMODYNE   metFORMIN 500 MG tablet Commonly known as: GLUCOPHAGE   onetouch ultrasoft lancets   OneTouch Verio w/Device Kit       TAKE these medications    acetaminophen 325 MG tablet Commonly known as: TYLENOL Take 650 mg by mouth every 6 (six) hours as needed.   ferrous gluconate 324 MG tablet Commonly known as: FERGON Take 1 tablet (324 mg total) by mouth daily with breakfast.   hydrocortisone 1 % lotion Apply 1 application topically 2 (two) times daily.   ibuprofen 600 MG tablet Commonly known as: ADVIL Take 1 tablet (600 mg total) by mouth every 6 (six) hours as needed.   NIFEdipine 90 MG 24 hr tablet Commonly known as: ADALAT CC Take 1 tablet (90 mg total) by mouth daily. What changed:  medication strength how much to take   PRENATAL PO Take by mouth.         Discharge home in stable condition Infant Feeding: Breast Infant Disposition:home with mother Discharge instruction: per After Visit Summary and Postpartum booklet. Activity: Advance as tolerated. Pelvic rest for 6 weeks.  Diet: routine diet Future Appointments: Future Appointments  Date Time Provider [place]  08/24/2020  8:50 AM [facility]  09/27/2020 10:55 AM Danielle Rankin [facility]  01/25/2021 10:40 AM Philemon Kingdom, MD [facility] None   Follow up Visit:  [place] [facility] [place] [facility]. Schedule an appointment as soon as possible for a visit in 1 week(s).   Specialty: Obstetrics and Gynecology Why: Please call to schedule appt for 1 wk BP check Contact information: [place] [phone]               Message sent to [facility] 08/13/20 by Sylvester Harder.   Please schedule this patient for a In person postpartum visit in 6 weeks with the following provider: Any provider. Additional Postpartum F/U:2 hour GTT and BP check 1  week  High risk pregnancy complicated by: GDM and HTN Delivery mode:  Vaginal, Spontaneous  Anticipated Birth Control:  Nexplanon at pp follow up   08/15/2020 Annalee Genta, DO

## 2020-08-14 LAB — GLUCOSE, CAPILLARY
Glucose-Capillary: 142 mg/dL — ABNORMAL HIGH (ref 70–99)
Glucose-Capillary: 125 mg/dL — ABNORMAL HIGH (ref 70–99)
Glucose-Capillary: 107 mg/dL — ABNORMAL HIGH (ref 70–99)
Glucose-Capillary: 116 mg/dL — ABNORMAL HIGH (ref 70–99)

## 2020-08-14 LAB — COMPREHENSIVE METABOLIC PANEL
ALT: 11 U/L (ref 0–44)
AST: 23 U/L (ref 15–41)
Albumin: 2.3 g/dL — ABNORMAL LOW (ref 3.5–5.0)
Alkaline Phosphatase: 94 U/L (ref 38–126)
Anion gap: 9 (ref 5–15)
BUN: 5 mg/dL — ABNORMAL LOW (ref 6–20)
CO2: 22 mmol/L (ref 22–32)
Calcium: 7.5 mg/dL — ABNORMAL LOW (ref 8.9–10.3)
Chloride: 102 mmol/L (ref 98–111)
Creatinine, Ser: 0.55 mg/dL (ref 0.44–1.00)
GFR, Estimated: >60 mL/min (ref >60)
Glucose, Bld: 111 mg/dL — ABNORMAL HIGH (ref 70–99)
Potassium: 4.5 mmol/L (ref 3.5–5.1)
Sodium: 133 mmol/L — ABNORMAL LOW (ref 135–145)
Total Bilirubin: 0.4 mg/dL (ref 0.3–1.2)
Total Protein: 5.4 g/dL — ABNORMAL LOW (ref 6.5–8.1)

## 2020-08-14 LAB — CBC
HCT: 29.6 % — ABNORMAL LOW (ref 36.0–46.0)
Hemoglobin: 9.7 g/dL — ABNORMAL LOW (ref 12.0–15.0)
MCH: 22.9 pg — ABNORMAL LOW (ref 26.0–34.0)
MCHC: 32.8 g/dL (ref 30.0–36.0)
MCV: 70 fL — ABNORMAL LOW (ref 80.0–100.0)
Platelets: 167 10*3/uL (ref 150–400)
RBC: 4.23 MIL/uL (ref 3.87–5.11)
RDW: 16.3 % — ABNORMAL HIGH (ref 11.5–15.5)
WBC: 17.5 10*3/uL — ABNORMAL HIGH (ref 4.0–10.5)
nRBC: 0 % (ref 0.0–0.2)

## 2020-08-14 NOTE — Progress Notes (Signed)
POSTPARTUM PROGRESS NOTE  PPD #1  Subjective:  Tammy Byrd is a 40 y.o. [id number] s/p VBAC at [redacted]w[redacted]d. Today she notes no acute complaints. She denies any problems with ambulating, voiding or po intake. Denies nausea or vomiting. She has passed flatus, no BM.  Pain is well controlled.  Lochia minimal Denies fever/chills/chest pain/SOB.  no HA, no blurry vision, no RUQ pain  Objective: Blood pressure (!) 141/81, pulse 77, temperature 99.4 F (37.4 C), temperature source Oral, resp. rate 18, height 5\' 2"  (1.575 m), weight 91.8 kg, last menstrual period 12/06/2019, SpO2 97 %, unknown if currently breastfeeding.  BP range: 123-141/69-81 UOP: 1825/8hr, add'l 200cc this am  Physical Exam:  General: alert, cooperative and no distress Chest: no respiratory distress, CTAB Heart: regular rate and rhythm Abdomen: soft, nontender Uterine Fundus: firm, non-tender, below umbilicus DVT Evaluation: No calf swelling or tenderness Extremities: 2+ pitting edema Skin: warm, dry  Results for orders placed or performed during the hospital encounter of 08/13/20 (from the past 24 hour(s))  Resp Panel by RT-PCR (Flu A&B, Covid) Nasopharyngeal Swab     Status: None   Collection Time: 08/13/20 10:52 AM   Specimen: Nasopharyngeal Swab; Nasopharyngeal(NP) swabs in vial transport medium  Result Value Ref Range   SARS Coronavirus 2 by RT PCR NEGATIVE NEGATIVE   Influenza A by PCR NEGATIVE NEGATIVE   Influenza B by PCR NEGATIVE NEGATIVE  Glucose, capillary     Status: Abnormal   Collection Time: 08/13/20 12:59 PM  Result Value Ref Range   Glucose-Capillary 115 (H) 70 - 99 mg/dL  Glucose, capillary     Status: Abnormal   Collection Time: 08/13/20  5:26 PM  Result Value Ref Range   Glucose-Capillary 120 (H) 70 - 99 mg/dL  Glucose, capillary     Status: Abnormal   Collection Time: 08/14/20  8:54 AM  Result Value Ref Range   Glucose-Capillary 142 (H) 70 - 99 mg/dL    Assessment/Plan: Tammy Byrd is a 40 y.o.  41 s/p VBAC at [redacted]w[redacted]d PPD#1 complicated by: 1) Chronic HTN with superimposed preeclampsia -currently on Mag x 24hr -Procardia XL 30mg  daily, BP appropriate, will continue to monitor -labs pending this am  2) GDMA2 -previously on metformin 1000mg  at night -fasting this am elevated- plan to continue with accucheck today, further management pending sugars  Contraception: Nexplanon for outpatient Feeding: both  Dispo: Continue with postpartum care as outlined above   LOS: 1 day   [redacted]w[redacted]d, DO Faculty Attending, Center for [facility] 08/14/2020, 9:07 AM

## 2020-08-14 NOTE — Progress Notes (Signed)
Spoke with Dr. Charlotta Newton regarding removal of epidural. Per pt, she has not had any headache today. Received verbal order to remove epidural.

## 2020-08-14 NOTE — Lactation Note (Signed)
This note was copied from a baby's chart. Lactation Consultation Note  Patient Name: Tammy Byrd [id number] Date: 08/14/2020 Reason for consult: Follow-up assessment;Early term 37-38.6wks;Maternal endocrine disorder GDM. MgSO4 Age:40 hours  LC in to visit with P4 experienced breastfeeding Mom.  Baby in cradle hold at end of 15 minute feeding.  Mom denies any pain with latch and states she heard swallowing. Baby acting fussy and Mom wanting help.  Assisted with positioning baby in football hold.  Mom needing assistance with supporting her breast during feedings. Mom hand expressed nice flow of colostrum.  Baby not latching, so assisted Mom to place baby STS on her chest and baby settled right down.  Encouraged STS with baby and offering breast with cues.  Reassured Mom about normal sleepiness in first day of life.   Mom knows to ask for help prn.   Interventions Interventions: Breast feeding basics reviewed;Skin to skin;Breast massage;Hand express;Assisted with latch;Education  Consult Status Consult Status: Follow-up Date: 08/15/20 Follow-up type: In-patient    Tammy Byrd 08/14/2020, 10:24 AM

## 2020-08-15 ENCOUNTER — Other Ambulatory Visit (HOSPITAL_COMMUNITY): Payer: Self-pay

## 2020-08-15 LAB — GLUCOSE, CAPILLARY
Glucose-Capillary: 101 mg/dL — ABNORMAL HIGH (ref 70–99)
Glucose-Capillary: 96 mg/dL (ref 70–99)

## 2020-08-15 LAB — SURGICAL PATHOLOGY

## 2020-08-15 MED ORDER — NIFEDIPINE ER OSMOTIC RELEASE 60 MG PO TB24
90.0000 mg | ORAL_TABLET | Freq: Every day | ORAL | Status: DC
  Administered 2020-08-15 – 2020-08-16 (×2): 90 mg via ORAL
  Filled 2020-08-15 (×2): qty 1

## 2020-08-15 MED ORDER — HYDROCHLOROTHIAZIDE 25 MG PO TABS
25.0000 mg | ORAL_TABLET | Freq: Every day | ORAL | Status: DC
  Administered 2020-08-15 – 2020-08-16 (×2): 25 mg via ORAL
  Filled 2020-08-15 (×2): qty 1

## 2020-08-15 MED ORDER — NIFEDIPINE ER 90 MG PO TB24
90.0000 mg | ORAL_TABLET | Freq: Every day | ORAL | 3 refills | Status: DC
  Filled 2020-08-15: qty 30, 30d supply, fill #0

## 2020-08-15 MED ORDER — IBUPROFEN 600 MG PO TABS
600.0000 mg | ORAL_TABLET | Freq: Four times a day (QID) | ORAL | 0 refills | Status: AC | PRN
  Filled 2020-08-15: qty 30, 8d supply, fill #0

## 2020-08-15 NOTE — Progress Notes (Signed)
Due to severe range BP, will hold off on discharge home today.  Pt given IV anti-hypertensives and HCTZ added.  Plan to continue hospitalization to monitor BP management.  Myna Hidalgo, DO Attending Obstetrician & Gynecologist, [facility] for [employer], [facility]

## 2020-08-15 NOTE — Lactation Note (Addendum)
This note was copied from a baby's chart. Lactation Consultation Note  Patient Name: Tammy Byrd HFSFS'E Date: 08/15/2020 Reason for consult: Follow-up assessment;Early term 37-38.6wks Age:40 hours  P4, Baby [redacted]w[redacted]d.  Mother latching baby upon entering. Assisted with guiding baby deeper on breast. Provided mother with manual pump and discussed pumping if baby becomes sleepy at the breast and give volume pumped back to baby. Reviewed engorgement care and monitoring voids/stools.   Maternal Data  GDM  Feeding Mother's Current Feeding Choice: Breast Milk  LATCH Score Latch: Grasps breast easily, tongue down, lips flanged, rhythmical sucking.  Audible Swallowing: A few with stimulation  Type of Nipple: Everted at rest and after stimulation  Comfort (Breast/Nipple): Soft / non-tender  Hold (Positioning): Assistance needed to correctly position infant at breast and maintain latch.  LATCH Score: 8   Lactation Tools Discussed/Used  Manual pump  Interventions Interventions: Breast feeding basics reviewed;Hand pump;Education  Discharge Discharge Education: Engorgement and breast care;Warning signs for feeding baby  Consult Status Consult Status: Complete Date: 08/15/20    Dahlia Byes [facility] 08/15/2020, 9:09 AM

## 2020-08-15 NOTE — Progress Notes (Signed)
POSTPARTUM PROGRESS NOTE  PPD #2  Subjective:  Tammy Byrd is a 40 y.o. [id number] s/p VBAC at [redacted]w[redacted]d. Today she notes she is doing well. She denies any problems with ambulating, voiding or po intake. Denies nausea or vomiting. She has + flatus, +BM.  Pain is well controlled.  Lochia minimal Denies fever/chills/chest pain/SOB.  no HA, no blurry vision, noRUQ pain  Objective: Blood pressure (!) 143/78, pulse 62, temperature 98.4 F (36.9 C), temperature source Oral, resp. rate 18, height 5\' 2"  (1.575 m), weight 91.8 kg, last menstrual period 12/06/2019, SpO2 97 %, unknown if currently breastfeeding.  Physical Exam:  General: alert, cooperative and no distress Chest: no respiratory distress Heart: regular rate and rhythm Abdomen: soft, nontender Uterine Fundus: firm, appropriately tender Incision: n/a DVT Evaluation: No calf swelling or tenderness Extremities: 2+ pitting edema Skin: warm, dry  Results for orders placed or performed during the hospital encounter of 08/13/20 (from the past 24 hour(s))  Glucose, capillary     Status: Abnormal   Collection Time: 08/14/20 12:03 PM  Result Value Ref Range   Glucose-Capillary 125 (H) 70 - 99 mg/dL   Comment 1 Notify RN    Comment 2 Document in Chart   Glucose, capillary     Status: Abnormal   Collection Time: 08/14/20  5:08 PM  Result Value Ref Range   Glucose-Capillary 107 (H) 70 - 99 mg/dL  Glucose, capillary     Status: Abnormal   Collection Time: 08/14/20  8:20 PM  Result Value Ref Range   Glucose-Capillary 116 (H) 70 - 99 mg/dL  Glucose, capillary     Status: Abnormal   Collection Time: 08/15/20  4:03 AM  Result Value Ref Range   Glucose-Capillary 101 (H) 70 - 99 mg/dL  Glucose, capillary     Status: None   Collection Time: 08/15/20  8:50 AM  Result Value Ref Range   Glucose-Capillary 96 70 - 99 mg/dL   Comment 1 Notify RN    Comment 2 Document in Chart     Assessment/Plan: Tammy Byrd is a 40 y.o. 41 s/p VBAC at [redacted]w[redacted]d PPD#2  complicated by: 1) cHTN with superimposed preeclampsia -s/p Mag -BP elevated this am, s/p IV Labetalol x1, Procardia increased this am  2) GDMA2 -sugars within normal limits, no meds at this time  Contraception: NExplanon as outpatient Feeding: both  Dispo: If BP remains stable this am, plan for discharge home later today   LOS: 2 days   [redacted]w[redacted]d, DO Faculty Attending, Center for [facility] 08/15/2020, 10:00 AM

## 2020-08-16 ENCOUNTER — Other Ambulatory Visit (HOSPITAL_COMMUNITY): Payer: Self-pay

## 2020-08-16 LAB — GLUCOSE, CAPILLARY: Glucose-Capillary: 107 mg/dL — ABNORMAL HIGH (ref 70–99)

## 2020-08-16 MED ORDER — HYDROCHLOROTHIAZIDE 25 MG PO TABS
25.0000 mg | ORAL_TABLET | Freq: Every day | ORAL | 0 refills | Status: DC
  Filled 2020-08-16: qty 30, 30d supply, fill #0

## 2020-08-16 MED ORDER — FUROSEMIDE 20 MG PO TABS
20.0000 mg | ORAL_TABLET | Freq: Every morning | ORAL | 0 refills | Status: DC
  Filled 2020-08-16: qty 3, 3d supply, fill #0

## 2020-08-16 MED ORDER — FUROSEMIDE 40 MG PO TABS
20.0000 mg | ORAL_TABLET | Freq: Every day | ORAL | Status: DC
  Administered 2020-08-16: 20 mg via ORAL
  Filled 2020-08-16: qty 1

## 2020-08-16 NOTE — Lactation Note (Signed)
This note was copied from a baby's chart. Lactation Consultation Note  Patient Name: Tammy Byrd [name] Date: 08/16/2020 Reason for consult: Follow-up assessment;Early term 37-38.6wks;Infant weight loss;Other (Comment) (9 % weight loss, milk is in bilaterally . no engorgment) Age:40 hours This LC was asked to see this Dyad by the NP.  Baby awake and hungry with a wet and stool ( transitional ).  Due to mom B/P being elevated / LC assisted mom to lay on her left side and latch the baby with depth/ increased swallows with breast compressions, and massaging the nodules. Breast softened and baby fed 7 mins and released. Per mom the feeding was comfortable.  LC recommended if the breast are really full to release the breast down so the areola is compressible like a sandwich for a deeper latch. Nipple well rounded when baby released.  LC reviewed the doc flow sheets and feel the voids and stools correlate with the weight loss. LC informed the NP - Lauren Rafeek.    Maternal Data Has patient been taught Hand Expression?: Yes  Feeding Mother's Current Feeding Choice: Breast Milk  LATCH Score Latch: Grasps breast easily, tongue down, lips flanged, rhythmical sucking.  Audible Swallowing: Spontaneous and intermittent  Type of Nipple: Everted at rest and after stimulation  Comfort (Breast/Nipple): Filling, red/small blisters or bruises, mild/mod discomfort  Hold (Positioning): Assistance needed to correctly position infant at breast and maintain latch.  LATCH Score: 8   Lactation Tools Discussed/Used Tools: Shells;Pump;Flanges Flange Size: 24;27 Breast pump type: Manual Pump Education: Milk Storage Reason for Pumping: PRN  Interventions Interventions: Breast feeding basics reviewed;Assisted with latch;Skin to skin;Breast massage;Hand express;Breast compression;Adjust position;Position options;Shells;Hand pump;Education  Discharge Discharge Education: Engorgement and breast  care;Warning signs for feeding baby Pump: Manual  Consult Status Consult Status: Complete Date: 08/16/20    Kathrin Greathouse 08/16/2020, 12:19 PM

## 2020-08-16 NOTE — Plan of Care (Signed)
  Problem: Education: Goal: Knowledge of General Education information will improve Description: Including pain rating scale, medication(s)/side effects and non-pharmacologic comfort measures Outcome: Adequate for Discharge   Problem: Health Behavior/Discharge Planning: Goal: Ability to manage health-related needs will improve Outcome: Adequate for Discharge   Problem: Clinical Measurements: Goal: Ability to maintain clinical measurements within normal limits will improve Outcome: Adequate for Discharge Goal: Will remain free from infection Outcome: Adequate for Discharge Goal: Diagnostic test results will improve Outcome: Adequate for Discharge Goal: Respiratory complications will improve Outcome: Adequate for Discharge Goal: Cardiovascular complication will be avoided Outcome: Adequate for Discharge   Problem: Activity: Goal: Risk for activity intolerance will decrease Outcome: Adequate for Discharge   Problem: Nutrition: Goal: Adequate nutrition will be maintained Outcome: Adequate for Discharge   Problem: Coping: Goal: Level of anxiety will decrease Outcome: Adequate for Discharge   Problem: Elimination: Goal: Will not experience complications related to bowel motility Outcome: Adequate for Discharge Goal: Will not experience complications related to urinary retention Outcome: Adequate for Discharge   Problem: Pain Managment: Goal: General experience of comfort will improve Outcome: Adequate for Discharge   Problem: Safety: Goal: Ability to remain free from injury will improve Outcome: Adequate for Discharge   Problem: Skin Integrity: Goal: Risk for impaired skin integrity will decrease Outcome: Adequate for Discharge   Problem: Fluid Volume: Goal: Peripheral tissue perfusion will improve Outcome: Adequate for Discharge   Problem: Clinical Measurements: Goal: Complications related to disease process, condition or treatment will be avoided or  minimized Outcome: Adequate for Discharge   Problem: Education: Goal: Knowledge of condition will improve Outcome: Adequate for Discharge Goal: Individualized Educational Video(s) Outcome: Adequate for Discharge Goal: Individualized Newborn Educational Video(s) Outcome: Adequate for Discharge   Problem: Activity: Goal: Will verbalize the importance of balancing activity with adequate rest periods Outcome: Adequate for Discharge Goal: Ability to tolerate increased activity will improve Outcome: Adequate for Discharge   Problem: Coping: Goal: Ability to identify and utilize available resources and services will improve Outcome: Adequate for Discharge   Problem: Life Cycle: Goal: Chance of risk for complications during the postpartum period will decrease Outcome: Adequate for Discharge   Problem: Role Relationship: Goal: Ability to demonstrate positive interaction with newborn will improve Outcome: Adequate for Discharge   Problem: Skin Integrity: Goal: Demonstration of wound healing without infection will improve Outcome: Adequate for Discharge

## 2020-08-16 NOTE — Discharge Summary (Addendum)
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 yo [phone] 329w2das admitted in Latent Labor on 08/13/2020. Patient had an uncomplicated labor course as follows:  Membrane Rupture Time/Date: 6:00 PM ,08/13/2020   Delivery Method:Vaginal, Spontaneous  Episiotomy: None  Lacerations:  2nd degree;Labial  Patient received IV Magnesium x 24hr due to preeclampsia.  BP was managed with procardia 90 bid, HCTZ was also started for BP control as well as Lasix x 3 days.  She is ambulating, tolerating a regular diet, passing flatus, and urinating well. Patient is discharged home in stable condition on 08/16/20.  Newborn Data: Birth date:08/13/2020  Birth time:7:40 PM  Gender:Female  Living status:Living  Apgars:9 ,9  Weight:3300 g   Magnesium Sulfate received: Yes: Seizure prophylaxis BMZ received: No Rhophylac:N/A MMR:N/A T-DaP:Given prenatally Flu: Yes Transfusion:No  Physical  exam  Vitals:   08/16/20 0715 08/16/20 1112 08/16/20 1128 08/16/20 1236  BP:  (!) 169/92 (!) 165/96 (!) 166/92  Pulse:  80 83 78  Resp:  17    Temp:  98.3 F (36.8 C)    TempSrc:  Oral    SpO2: 98% 99%    Weight:      Height:       General: alert, cooperative, and no distress CV: RRR Lungs; CTAB Lochia: appropriate Uterine Fundus: firm, below umbilicus Incision: N/A DVT Evaluation: No evidence of DVT seen on physical exam. Labs: Lab Results  Component Value Date   WBC 17.5 (H) 08/14/2020   HGB 9.7 (L) 08/14/2020   HCT 29.6 (L) 08/14/2020   MCV 70.0 (L) 08/14/2020   PLT 167 08/14/2020   CMP Latest Ref Rng & Units 08/14/2020  Glucose 70 - 99 mg/dL 111(H)  BUN 6 - 20 mg/dL 5(L)  Creatinine 0.44 - 1.00 mg/dL 0.55  Sodium 135 - 145 mmol/L 133(L)  Potassium 3.5 - 5.1 mmol/L 4.5  Chloride 98 - 111 mmol/L 102  CO2 22 - 32 mmol/L 22  Calcium 8.9 - 10.3 mg/dL 7.5(L)  Total Protein 6.5 - 8.1 g/dL 5.4(L)  Total Bilirubin 0.3 - 1.2 mg/dL 0.4  Alkaline Phos 38 - 126 U/L 94  AST 15 - 41 U/L 23  ALT 0 - 44 U/L 11   Edinburgh Score: Edinburgh Postnatal Depression Scale Screening  Tool 08/15/2020  I have been able to laugh and see the funny side of things. 0  I have looked forward with enjoyment to things. 0  I have blamed myself unnecessarily when things went wrong. 0  I have been anxious or worried for no good reason. 0  I have felt scared or panicky for no good reason. 0  Things have been getting on top of me. 0  I have been so unhappy that I have had difficulty sleeping. 0  I have felt sad or miserable. 0  I have been so unhappy that I have been crying. 0  The thought of harming myself has occurred to me. 0  Edinburgh Postnatal Depression Scale Total 0     After visit meds:  Allergies as of 08/16/2020       Reactions   Latex Rash   BUMPS AND SWELLING        Medication List     STOP taking these medications    Alcohol Wipes 70 % Pads   aspirin EC 81 MG  tablet   glucose blood test strip   labetalol 200 MG tablet Commonly known as: NORMODYNE   metFORMIN 500 MG tablet Commonly known as: GLUCOPHAGE   onetouch ultrasoft lancets   OneTouch Verio w/Device Kit       TAKE these medications    acetaminophen 325 MG tablet Commonly known as: TYLENOL Take 650 mg by mouth every 6 (six) hours as needed for mild pain or headache.   calcium carbonate 500 MG chewable tablet Commonly known as: TUMS - dosed in mg elemental calcium Chew 1-2 tablets by mouth 3 (three) times daily as needed for indigestion or heartburn.   ferrous gluconate 324 MG tablet Commonly known as: FERGON Take 1 tablet (324 mg total) by mouth daily with breakfast.   furosemide 20 MG tablet Commonly known as: LASIX Take 1 tablet (20 mg total) by mouth in the morning for 3 days.   hydrochlorothiazide 25 MG tablet Commonly known as: HYDRODIURIL Take 1 tablet (25 mg total) by mouth daily.   ibuprofen 600 MG tablet Commonly known as: ADVIL Take 1 tablet (600 mg total) by mouth every 6 (six) hours as needed.   NIFEdipine 90 MG 24 hr tablet Commonly known as: ADALAT CC Take 1 tablet (90 mg total) by mouth daily. What changed:  medication strength how much to take   PRENATAL PO Take 1 capsule by mouth daily.         Discharge home in stable condition Infant Feeding: Breast Infant Disposition:home with mother Discharge instruction: per After Visit Summary and Postpartum booklet. Activity: Advance as tolerated. Pelvic rest for 6 weeks.  Diet: routine diet Future Appointments: Future Appointments  Date Time Provider [place]  08/24/2020  8:50 AM [facility]  09/27/2020 10:55 AM Danielle Rankin [facility]  01/25/2021 10:40 AM Philemon Kingdom, MD [facility] None   Follow up Visit:  [place] [facility] [place] [facility]. Schedule an appointment as soon as possible for a  visit in 1 week(s).   Specialty: Obstetrics and Gynecology Why: Please call to schedule appt for 1 wk BP check Contact information: [place] [address] [phone]               Message sent to [facility] 08/13/20 by Sylvester Harder.   Please schedule this patient for a In person postpartum visit in 6 weeks with  the following provider: Any provider. Additional Postpartum F/U:2 hour GTT and BP check 1 week  High risk pregnancy complicated by: GDM and HTN Delivery mode:  Vaginal, Spontaneous  Anticipated Birth Control:  Nexplanon at pp follow up   08/16/2020 Annalee Genta, DO

## 2020-08-17 ENCOUNTER — Other Ambulatory Visit (HOSPITAL_COMMUNITY): Payer: Self-pay

## 2020-08-21 ENCOUNTER — Ambulatory Visit: Payer: No Typology Code available for payment source

## 2020-08-23 ENCOUNTER — Telehealth (HOSPITAL_COMMUNITY): Payer: Self-pay | Admitting: *Deleted

## 2020-08-23 NOTE — Telephone Encounter (Signed)
Left message to return nurse call. Duffy Rhody, RN 08/23/2020 11:25am

## 2020-08-24 ENCOUNTER — Other Ambulatory Visit (INDEPENDENT_AMBULATORY_CARE_PROVIDER_SITE_OTHER): Payer: No Typology Code available for payment source

## 2020-08-24 ENCOUNTER — Other Ambulatory Visit: Payer: Self-pay

## 2020-08-24 VITALS — BP 150/89 | HR 110 | Wt 172.1 lb

## 2020-08-24 DIAGNOSIS — Z0130 Encounter for examination of blood pressure without abnormal findings: Secondary | ICD-10-CM

## 2020-08-24 DIAGNOSIS — O24415 Gestational diabetes mellitus in pregnancy, controlled by oral hypoglycemic drugs: Secondary | ICD-10-CM

## 2020-08-24 DIAGNOSIS — I10 Essential (primary) hypertension: Secondary | ICD-10-CM

## 2020-08-24 NOTE — Patient Instructions (Signed)
Constipation:  Colace  Ducolax suppositories  Fleet enema  Glycerin suppositories  Metamucil  Milk of magnesia  Miralax  Senokot  Smooth move tea          

## 2020-08-24 NOTE — Progress Notes (Signed)
Chart reviewed for nurse visit. Agree with plan of care.   Referred to Cardio OB clinic due to treatment resistant hypertension.   Venora Maples, MD 08/24/20 1:19 PM

## 2020-08-24 NOTE — Progress Notes (Signed)
Patient here for blood pressure check following spontaneous vaginal delivery on 08/13/20. History of chronic hypertension. Currently taking hydrochlorothiazide 25 mg daily and nifedipine 90 mg daily; reports taking last at 7 AM today. BP today is 150/89, HR 110. Patient denies any symptoms of elevated BP. Reviewed with Crissie Reese, MD who gives recommendation that patient be referred to [facility] due to uncontrolled hypertension on multiple BP meds.  Pt reports constipation, has not had a BM in 2-3 days. List of over the counter meds given to pt. Recommended pt begin by taking Miralax daily. Encouraged increased fluids and fiber in diet.  Patient was scheduled for PP 2 hour GTT due to gestational diabetes. Pt was not aware of need to be fasting for this appt. Will return for 2 hr GTT and PP visit on 09/27/20.  Fleet Contras RN 08/24/20

## 2020-08-24 NOTE — Progress Notes (Signed)
Patient stated she had eaten a snack not aware that she needed to be fasting.  Needs to reschedule  lab

## 2020-09-27 ENCOUNTER — Ambulatory Visit (INDEPENDENT_AMBULATORY_CARE_PROVIDER_SITE_OTHER): Payer: No Typology Code available for payment source | Admitting: Medical

## 2020-09-27 ENCOUNTER — Encounter: Payer: Self-pay | Admitting: Medical

## 2020-09-27 ENCOUNTER — Other Ambulatory Visit: Payer: Self-pay

## 2020-09-27 DIAGNOSIS — Z3202 Encounter for pregnancy test, result negative: Secondary | ICD-10-CM | POA: Diagnosis not present

## 2020-09-27 DIAGNOSIS — Z3042 Encounter for surveillance of injectable contraceptive: Secondary | ICD-10-CM

## 2020-09-27 DIAGNOSIS — Z8632 Personal history of gestational diabetes: Secondary | ICD-10-CM

## 2020-09-27 LAB — POCT PREGNANCY, URINE: Preg Test, Ur: NEGATIVE

## 2020-09-27 MED ORDER — MEDROXYPROGESTERONE ACETATE 150 MG/ML IM SUSP
150.0000 mg | Freq: Once | INTRAMUSCULAR | Status: AC
  Administered 2020-09-27: 150 mg via INTRAMUSCULAR

## 2020-09-27 NOTE — Progress Notes (Signed)
Post Partum Visit Note  Tammy Byrd is a 40 y.o. [phone] female who presents for a postpartum visit. She is 6 weeks postpartum following a normal spontaneous vaginal delivery.  I have fully reviewed the prenatal and intrapartum course. The delivery was at 37/2 gestational weeks.  Anesthesia: epidural. Postpartum course has been uncomplicated. Baby is doing well. Baby is feeding by breast. Bleeding no bleeding. Bowel function is  Constipated . Bladder function is normal. Patient is not sexually active. Contraception method is abstinence. Postpartum depression screening: negative.   Upstream - 09/27/20 1158       Pregnancy Intention Screening   Does the patient want to become pregnant in the next year? No    Does the patient's partner want to become pregnant in the next year? No    Would the patient like to discuss contraceptive options today? Yes      Contraception Wrap Up   Current Method Hormonal Injection    End Method Hormonal Injection    Contraception Counseling Provided Yes            The pregnancy intention screening data noted above was reviewed. Potential methods of contraception were discussed. The patient elected to proceed with Hormonal Injection.   Edinburgh Postnatal Depression Scale - 09/27/20 1144       Edinburgh Postnatal Depression Scale:  In the Past 7 Days   I have been able to laugh and see the funny side of things. 0    I have looked forward with enjoyment to things. 0    I have blamed myself unnecessarily when things went wrong. 0    I have been anxious or worried for no good reason. 0    I have felt scared or panicky for no good reason. 0    Things have been getting on top of me. 0    I have been so unhappy that I have had difficulty sleeping. 0    I have felt sad or miserable. 0    I have been so unhappy that I have been crying. 0    The thought of harming myself has occurred to me. 0    Edinburgh Postnatal Depression Scale Total 0              Health Maintenance Due  Topic Date Due   PNEUMOCOCCAL POLYSACCHARIDE VACCINE AGE 49-64 HIGH RISK  Never done   Pneumococcal Vaccine 62-64 Years old (1 - PCV) Never done   OPHTHALMOLOGY EXAM  Never done   COVID-19 Vaccine (3 - Pfizer risk series) 06/20/2019   FOOT EXAM  06/06/2020   INFLUENZA VACCINE  09/17/2020   URINE MICROALBUMIN  09/21/2020    The following portions of the patient's history were reviewed and updated as appropriate: allergies, current medications, past family history, past medical history, past social history, past surgical history, and problem list.  Review of Systems Pertinent items are noted in HPI.  Objective:  BP (!) 151/96 (BP Location: Left Arm)   Pulse 96   Ht 5\' 2"  (1.575 m)   Wt 171 lb 12.8 oz (77.9 kg)   LMP 12/06/2019   Breastfeeding Yes   BMI 31.42 kg/m    General:  alert and cooperative   Breasts:  not indicated  Lungs: clear to auscultation bilaterally  Heart:  regular rate and rhythm, S1, S2 normal, no murmur, click, rub or gallop  Abdomen: soft, non-tender; bowel sounds normal; no masses,  no organomegaly   Wound N/A  GU exam:  not indicated       Assessment:   Normal postpartum exam.  History of Gestation Diabetes Chronic Hypertension  Plan:   Essential components of care per ACOG recommendations:  1.  Mood and well being: Patient with negative depression screening today. Reviewed local resources for support.  - Patient tobacco use? No.   - hx of drug use? No.    2. Infant care and feeding:  -Patient currently breastmilk feeding? Yes. Discussed returning to work and pumping. Reviewed importance of draining breast regularly to support lactation.  -Social determinants of health (SDOH) reviewed in EPIC. No concerns  3. Sexuality, contraception and birth spacing - Patient does not want a pregnancy in the next year.  Desired family size is 4 children.  - Reviewed forms of contraception in tiered fashion. Patient desired  Depo-Provera today.    4. Sleep and fatigue -Encouraged family/partner/community support of 4 hrs of uninterrupted sleep to help with mood and fatigue  5. Physical Recovery  - Discussed patients delivery and complications. She describes her labor as good. - Patient had a Vaginal, no problems at delivery. Patient had a 2nd degree laceration. Perineal healing reviewed. Patient expressed understanding - Patient has urinary incontinence? No. - Patient is safe to resume physical and sexual activity  6.  Health Maintenance - HM due items addressed Yes - Last pap smear  Diagnosis  Date Value Ref Range Status  04/11/2020   Final   - Negative for intraepithelial lesion or malignancy (NILM)   Pap smear not done at today's visit.  -Breast Cancer screening indicated? Yes. Patient referred today for mammogram.  Advised post breast feeding.  7. Chronic Disease/Pregnancy Condition follow up: Hypertension and Gestational Diabetes - PCP follow up - Dr. Vincenza Hews with [facility] - Patient will return for PP GTT when fasting  - Patient missed last dose of HCTZ and Procardia, advised to restart and follow-up with PCP for future management  Vonzella Nipple, PA-C [facility] [employer]

## 2020-09-27 NOTE — Progress Notes (Signed)
Pt wants Depo for Birth Control. 

## 2020-10-04 ENCOUNTER — Other Ambulatory Visit: Payer: Self-pay

## 2020-10-04 ENCOUNTER — Other Ambulatory Visit: Payer: No Typology Code available for payment source

## 2020-10-04 ENCOUNTER — Other Ambulatory Visit: Payer: Self-pay | Admitting: General Practice

## 2020-10-04 ENCOUNTER — Ambulatory Visit (INDEPENDENT_AMBULATORY_CARE_PROVIDER_SITE_OTHER): Payer: No Typology Code available for payment source

## 2020-10-04 VITALS — BP 138/75 | HR 128 | Ht 62.0 in | Wt 171.0 lb

## 2020-10-04 DIAGNOSIS — Z8632 Personal history of gestational diabetes: Secondary | ICD-10-CM

## 2020-10-04 DIAGNOSIS — I10 Essential (primary) hypertension: Secondary | ICD-10-CM

## 2020-10-04 NOTE — Progress Notes (Signed)
Patient was assessed and managed by nursing staff during this encounter. I have reviewed the chart and agree with the documentation and plan. I have also made any necessary editorial changes.  Warden Fillers, MD 10/04/2020 9:24 AM

## 2020-10-04 NOTE — Progress Notes (Signed)
Pt here today for PP BP check. Pt denies headaches, visual changes or swelling. Pt has been taking HCTZ and Procardia Rx as directed.   BP today: 138/75 128 pulse, but pt is fasting for GTT 2 hr PP today. Pt denies SOB, palpitations.   Pt has follow up appt with Dr Servando Salina tomorrow for [facility]. Pt aware and agreeable to date and time of appt.   Judeth Cornfield, RN

## 2020-10-05 ENCOUNTER — Ambulatory Visit (INDEPENDENT_AMBULATORY_CARE_PROVIDER_SITE_OTHER): Payer: No Typology Code available for payment source | Admitting: Cardiology

## 2020-10-05 ENCOUNTER — Encounter: Payer: Self-pay | Admitting: Cardiology

## 2020-10-05 VITALS — BP 174/96 | HR 90 | Ht 62.0 in | Wt 175.0 lb

## 2020-10-05 DIAGNOSIS — R7303 Prediabetes: Secondary | ICD-10-CM | POA: Diagnosis not present

## 2020-10-05 DIAGNOSIS — O165 Unspecified maternal hypertension, complicating the puerperium: Secondary | ICD-10-CM | POA: Diagnosis not present

## 2020-10-05 DIAGNOSIS — R011 Cardiac murmur, unspecified: Secondary | ICD-10-CM

## 2020-10-05 LAB — GLUCOSE TOLERANCE, 2 HOURS
Glucose, 2 hour: 159 mg/dL — ABNORMAL HIGH (ref 65–139)
Glucose, GTT - Fasting: 106 mg/dL — ABNORMAL HIGH (ref 65–99)

## 2020-10-05 NOTE — Patient Instructions (Addendum)
Medication Instructions:  Your physician recommends that you continue on your current medications as directed. Please refer to the Current Medication list given to you today.  *If you need a refill on your cardiac medications before your next appointment, please call your pharmacy*   Lab Work: None If you have labs (blood work) drawn today and your tests are completely normal, you will receive your results only by: MyChart Message (if you have MyChart) OR A paper copy in the mail If you have any lab test that is abnormal or we need to change your treatment, we will call you to review the results.   Testing/Procedures: Your physician has requested that you have an echocardiogram. Echocardiography is a painless test that uses sound waves to create images of your heart. It provides your doctor with information about the size and shape of your heart and how well your heart's chambers and valves are working. This procedure takes approximately one hour. There are no restrictions for this procedure.    Follow-Up: At [facility], you and your health needs are our priority.  As part of our continuing mission to provide you with exceptional heart care, we have created designated Provider Care Teams.  These Care Teams include your primary Cardiologist (physician) and Advanced Practice Providers (APPs -  Physician Assistants and Nurse Practitioners) who all work together to provide you with the care you need, when you need it.  We recommend signing up for the patient portal called "MyChart".  Sign up information is provided on this After Visit Summary.  MyChart is used to connect with patients for Virtual Visits (Telemedicine).  Patients are able to view lab/test results, encounter notes, upcoming appointments, etc.  Non-urgent messages can be sent to your provider as well.   To learn more about what you can do with MyChart, go to [email or web address].    Your next appointment:   4 weeks  The  format for your next appointment:   In Person  Provider:   Thomasene Ripple, DO    Other Instructions Please take blood pressure daily and sendin MyChart message after 1 week.

## 2020-10-05 NOTE — Progress Notes (Signed)
Cardio-Obstetrics Clinic  New Evaluation  Date:  10/06/2020   ID:  Tammy Byrd, DOB 01/08/1981, MRN [id number]  PCP:  Tammy Agee, NP   [facility] Providers Cardiologist:  Thomasene Ripple, DO  Electrophysiologist:  None       Referring MD: Tammy Maples, MD   Chief Complaint: Chronic hypertension, history of pre-eclampsia  History of Present Illness:    Tammy Byrd is a 40 y.o. female [[id number]] who is being seen today for the evaluation of hypertension at the request of Crissie Reese, Mary Sella, MD.   Patient is currently 6 weeks postpartum. Per chart review, she had chronic hypertension throughout her pregnancy that was poorly controlled. She was not consistently compliant with her medications because she reported normal blood pressure readings at home. She was treated with IV magnesium intrapartum due to pre-eclampsia. She also has a history of pre-eclampsia with prior pregnancies.  Today patient reports she feels well overall. No chest pain, shortness of breath, headaches, dizziness, or syncope. States she feels jittery when she takes the hydrochlorothiazide so she discontinued it. She only takes the nifedipine some days. Did not take it today because her home BP was "normal". Patient feels her blood pressure is elevated in the office because she gets nervous.   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 BIRTHING SUITES;  Service: Obstetrics;  Laterality: N/A;   NO PAST SURGERIES     { Click here to update PMH, PSH, OB Hx then refresh  note  :1}   OB History     Gravida  4   Para  4   Term  2   Preterm  2   AB  0   Living  4      SAB  0   IAB  0   Ectopic  0   Multiple  0   Live Births  4        Obstetric Comments  C/s for breech             Current Medications: Current Meds  Medication Sig   acetaminophen (TYLENOL) 325 MG tablet Take 650 mg by mouth every 6 (six) hours as needed for mild pain or headache.   ferrous gluconate (FERGON) 324 MG tablet Take 1 tablet (324 mg total) by mouth daily with breakfast.   hydrochlorothiazide (HYDRODIURIL) 25 MG tablet Take 1 tablet (25 mg total) by mouth daily.   ibuprofen (ADVIL) 600 MG tablet Take 1 tablet (600 mg total) by mouth every 6 (six) hours as needed.   NIFEdipine (ADALAT CC) 90 MG 24 hr tablet Take 1 tablet (90 mg total) by mouth daily.   Prenatal Vit-Fe Fumarate-FA (PRENATAL PO) Take 1 capsule by mouth daily.     Allergies:   Latex   Social History   Socioeconomic History   Marital status: Single    Spouse name: Not on file   Number of children: 3   Years of education: Not on file   Highest education level: Not on file  Occupational  History   Not on file  Tobacco Use   Smoking status: Never   Smokeless tobacco: Never  Vaping Use   Vaping Use: Never used  Substance and Sexual Activity   Alcohol use: No   Drug use: No   Sexual activity: Not Currently  Other Topics Concern   Not on file  Social History Narrative   Not on file   Social Determinants of Health   Financial Resource Strain: Not on file  Food Insecurity: No Food Insecurity   Worried About Running Out of Food in the Last Year: Never true   Ran Out of Food in the Last Year: Never true  Transportation Needs: No Transportation Needs   Lack of Transportation (Medical): No   Lack of Transportation (Non-Medical): No  Physical Activity: Not on file  Stress: Not on file  Social Connections: Not on file      Family History  Problem Relation Age of Onset    Kidney disease Mother    Heart disease Father       ROS:   Please see the history of present illness.    All other systems reviewed and are negative.   Labs/EKG Reviewed:    EKG:   EKG was not ordered today. Recent EKG normal sinus rhythm, normal EKG  Recent Labs: 07/20/2020: TSH 0.95 08/14/2020: ALT 11; BUN 5; Creatinine, Ser 0.55; Hemoglobin 9.7; Platelets 167; Potassium 4.5; Sodium 133   Recent Lipid Panel Lab Results  Component Value Date/Time   CHOL 234 (H) 06/07/2019 10:46 AM   TRIG 208 (H) 06/07/2019 10:46 AM   HDL 47 06/07/2019 10:46 AM   CHOLHDL 5.0 (H) 06/07/2019 10:46 AM   LDLCALC 149 (H) 06/07/2019 10:46 AM    Physical Exam:    VS:  BP (!) 174/96 (BP Location: Left Arm, Patient Position: Sitting, Cuff Size: Normal)   Pulse 90   Ht 5\' 2"  (1.575 m)   Wt 175 lb (79.4 kg)   LMP  (LMP Unknown)   SpO2 99%   BMI 32.01 kg/m     Wt Readings from Last 3 Encounters:  10/05/20 175 lb (79.4 kg)  10/04/20 171 lb (77.6 kg)  09/27/20 171 lb 12.8 oz (77.9 kg)     GEN: Well nourished, well developed in no acute distress HEENT: Normal NECK: No JVD; No carotid bruits LYMPHATICS: No lymphadenopathy CARDIAC: RRR, II/VI mid systolic murmur, no rubs, gallops RESPIRATORY:  Clear to auscultation without rales, wheezing or rhonchi  ABDOMEN: Soft, non-tender, non-distended MUSCULOSKELETAL:  No edema; No deformity  SKIN: Warm and dry NEUROLOGIC:  Alert and oriented x 3 PSYCHIATRIC:  Normal affect    Risk Assessment/Risk Calculators:     CARPREG II Risk Prediction Index Score:  1.  The patient's risk for a primary cardiac event is 5%.            ASSESSMENT & PLAN:    Postpartum HTN Cardiac Murmur Hyperlipidemia  Patient's blood pressure is not well controlled. BP significantly elevated at today's visit up to 174/96. She has not been compliant with prescribed antihypertensives because she feels her BP is normal at home. Discussed the importance of medication  therapy, and that her home readings (often >130) are not within normal range. She is agreeable to taking Nifedipine 90mg  daily. She will check her BP twice daily and take HCTZ 12.5mg  only if the afternoon reading is above 130. Patient to keep written log of her home BP readings and send via MyChart. Additionally, she will bring  her home cuff to her next visit.   Due to her significantly elevated blood pressure (likely has been uncontrolled for quite some time),and murmur noted on exam today, will obtain echocardiogram.   For her hyperlipidemia, recommend repeat lipid panel when she is out of the peripartum window.    Patient Instructions  Medication Instructions:  Your physician recommends that you continue on your current medications as directed. Please refer to the Current Medication list given to you today.  *If you need a refill on your cardiac medications before your next appointment, please call your pharmacy*   Lab Work: None If you have labs (blood work) drawn today and your tests are completely normal, you will receive your results only by: MyChart Message (if you have MyChart) OR A paper copy in the mail If you have any lab test that is abnormal or we need to change your treatment, we will call you to review the results.   Testing/Procedures: Your physician has requested that you have an echocardiogram. Echocardiography is a painless test that uses sound waves to create images of your heart. It provides your doctor with information about the size and shape of your heart and how well your heart's chambers and valves are working. This procedure takes approximately one hour. There are no restrictions for this procedure.    Follow-Up: At [facility], you and your health needs are our priority.  As part of our continuing mission to provide you with exceptional heart care, we have created designated Provider Care Teams.  These Care Teams include your primary Cardiologist  (physician) and Advanced Practice Providers (APPs -  Physician Assistants and Nurse Practitioners) who all work together to provide you with the care you need, when you need it.  We recommend signing up for the patient portal called "MyChart".  Sign up information is provided on this After Visit Summary.  MyChart is used to connect with patients for Virtual Visits (Telemedicine).  Patients are able to view lab/test results, encounter notes, upcoming appointments, etc.  Non-urgent messages can be sent to your provider as well.   To learn more about what you can do with MyChart, go to [email or web address].    Your next appointment:   4 weeks  The format for your next appointment:   In Person  Provider:   Thomasene Ripple, DO    Other Instructions Please take blood pressure daily and sendin MyChart message after 1 week.    Medication Adjustments/Labs and Tests Ordered: Current medicines are reviewed at length with the patient today.  Concerns regarding medicines are outlined above.  Tests Ordered: Orders Placed This Encounter  Procedures   ECHOCARDIOGRAM COMPLETE   Medication Changes: No orders of the defined types were placed in this encounter.   Maury Dus, MD PGY-2 [facility]

## 2020-10-10 MED ORDER — AMLODIPINE BESYLATE 5 MG PO TABS: 5.0000 mg | ORAL_TABLET | Freq: Every day | ORAL | 3 refills | Status: DC

## 2020-10-24 ENCOUNTER — Ambulatory Visit (HOSPITAL_COMMUNITY): Payer: No Typology Code available for payment source | Attending: Cardiovascular Disease

## 2020-10-24 ENCOUNTER — Other Ambulatory Visit: Payer: Self-pay

## 2020-10-24 DIAGNOSIS — R011 Cardiac murmur, unspecified: Secondary | ICD-10-CM | POA: Diagnosis present

## 2020-10-24 LAB — ECHOCARDIOGRAM COMPLETE
Area-P 1/2: 3.65 cm2
S' Lateral: 2.7 cm

## 2020-11-09 ENCOUNTER — Other Ambulatory Visit: Payer: Self-pay

## 2020-11-09 ENCOUNTER — Encounter: Payer: Self-pay | Admitting: Cardiology

## 2020-11-09 ENCOUNTER — Ambulatory Visit (INDEPENDENT_AMBULATORY_CARE_PROVIDER_SITE_OTHER): Payer: No Typology Code available for payment source | Admitting: Cardiology

## 2020-11-09 VITALS — BP 171/89 | HR 108 | Ht 62.0 in | Wt 172.5 lb

## 2020-11-09 DIAGNOSIS — I10 Essential (primary) hypertension: Secondary | ICD-10-CM

## 2020-11-09 DIAGNOSIS — Z8632 Personal history of gestational diabetes: Secondary | ICD-10-CM

## 2020-11-09 DIAGNOSIS — Z8759 Personal history of other complications of pregnancy, childbirth and the puerperium: Secondary | ICD-10-CM | POA: Diagnosis not present

## 2020-11-09 DIAGNOSIS — E669 Obesity, unspecified: Secondary | ICD-10-CM

## 2020-11-09 MED ORDER — PROPRANOLOL HCL 20 MG PO TABS: 20.0000 mg | ORAL_TABLET | Freq: Two times a day (BID) | ORAL | 3 refills | Status: DC

## 2020-11-09 NOTE — Patient Instructions (Signed)
Medication Instructions:  Your physician has recommended you make the following change in your medication:  START: Propranolol 20 mg twice daily *If you need a refill on your cardiac medications before your next appointment, please call your pharmacy*   Lab Work: None If you have labs (blood work) drawn today and your tests are completely normal, you will receive your results only by: MyChart Message (if you have MyChart) OR A paper copy in the mail If you have any lab test that is abnormal or we need to change your treatment, we will call you to review the results.   Testing/Procedures: None   Follow-Up: At [facility], you and your health needs are our priority.  As part of our continuing mission to provide you with exceptional heart care, we have created designated Provider Care Teams.  These Care Teams include your primary Cardiologist (physician) and Advanced Practice Providers (APPs -  Physician Assistants and Nurse Practitioners) who all work together to provide you with the care you need, when you need it.  We recommend signing up for the patient portal called "MyChart".  Sign up information is provided on this After Visit Summary.  MyChart is used to connect with patients for Virtual Visits (Telemedicine).  Patients are able to view lab/test results, encounter notes, upcoming appointments, etc.  Non-urgent messages can be sent to your provider as well.   To learn more about what you can do with MyChart, go to [email or web address].    Your next appointment:   3 month(s)  The format for your next appointment:   In Person  Provider:   Thomasene Ripple, DO [address], [place], [place] [address]    Other Instructions

## 2020-11-09 NOTE — Progress Notes (Signed)
Cardio-Obstetrics Clinic  Follow Up Note   Date:  11/09/2020   ID:  Tammy Byrd, DOB 1981-02-03, MRN [id number]  PCP:  Janeece Agee, NP   [facility] Providers Cardiologist:  Thomasene Ripple, DO  Electrophysiologist:  None        Referring MD: Janeece Agee, NP   Chief Complaint: I am doing fine  History of Present Illness:    Tammy Byrd is a 40 y.o. female [[id number]] who returns for follow up of chronic hypertension.  The patient was initially seen by me on October 05, 2020 at that time her blood pressure was elevated.  She was taking hydrochlorothiazide and this had been discontinued due to jittery feeling.  In addition she had not been taking her nifedipine at the time of that visit.  During her visit with me her blood pressure was elevated.  She was agreeable to take her nifedipine in was agreeable to check her blood pressure and she also wanted to again try her hydrochlorothiazide.  I will send the patient for echocardiogram given her heart murmur.  In the interim the patient actually stopped the nifedipine and continue to take her blood pressure.  She had sent me information which noted elevated blood pressure I actually started the patient on amlodipine and asked her to continue her hydrochlorothiazide.  Unfortunately does not appear that she has been taking both medication.  She is only taking amlodipine.  According to the patient she started taking hydrochlorothiazide she got some rash.   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    GDM (gestational diabetes mellitus)    Gestational diabetes    History of severe pre-eclampsia    Pregnancy induced hypertension     Past Surgical History:  Procedure Laterality Date   CESAREAN SECTION N/A 09/04/2015   Procedure: CESAREAN SECTION;  Surgeon: Levie Heritage, DO;  Location: [facility] BIRTHING SUITES;  Service:  Obstetrics;  Laterality: N/A;   NO PAST SURGERIES        OB History     Gravida  4   Para  4   Term  2   Preterm  2   AB  0   Living  4      SAB  0   IAB  0   Ectopic  0   Multiple  0   Live Births  4        Obstetric Comments  C/s for breech             Current Medications: Current Meds  Medication Sig   acetaminophen (TYLENOL) 325 MG tablet Take 650 mg by mouth every 6 (six) hours as needed for mild pain or headache.   amLODipine (NORVASC) 5 MG tablet Take 1 tablet (5 mg total) by mouth daily.   ferrous gluconate (FERGON) 324 MG tablet Take 1 tablet (324 mg total) by mouth daily with breakfast.   hydrochlorothiazide (HYDRODIURIL) 25 MG tablet Take 1 tablet (25 mg total) by mouth daily.   ibuprofen (ADVIL) 600 MG tablet Take 1 tablet (600 mg total) by mouth every 6 (six) hours as needed. (Patient taking differently: Take 600 mg by mouth every 6 (six) hours as needed for headache, mild pain or moderate pain.)   Prenatal Vit-Fe Fumarate-FA (PRENATAL PO) Take 1 capsule by mouth daily. Unknown strength   propranolol (INDERAL) 20 MG tablet Take 1 tablet (  20 mg total) by mouth 2 (two) times daily.   [DISCONTINUED] NIFEdipine (ADALAT CC) 90 MG 24 hr tablet Take 1 tablet (90 mg total) by mouth daily.     Allergies:   Hydrochlorothiazide and Latex   Social History   Socioeconomic History   Marital status: Single    Spouse name: Not on file   Number of children: 3   Years of education: Not on file   Highest education level: Not on file  Occupational History   Not on file  Tobacco Use   Smoking status: Never   Smokeless tobacco: Never  Vaping Use   Vaping Use: Never used  Substance and Sexual Activity   Alcohol use: No   Drug use: No   Sexual activity: Not Currently  Other Topics Concern   Not on file  Social History Narrative   Not on file   Social Determinants of Health   Financial Resource Strain: Not on file  Food Insecurity: No Food  Insecurity   Worried About Running Out of Food in the Last Year: Never true   Ran Out of Food in the Last Year: Never true  Transportation Needs: No Transportation Needs   Lack of Transportation (Medical): No   Lack of Transportation (Non-Medical): No  Physical Activity: Not on file  Stress: Not on file  Social Connections: Not on file      Family History  Problem Relation Age of Onset   Kidney disease Mother    Heart disease Father       ROS:   Please see the history of present illness.     All other systems reviewed and are negative.   Labs/EKG Reviewed:    EKG:   EKG is was ordered today.     Recent Labs: 07/20/2020: TSH 0.95 08/14/2020: ALT 11; BUN 5; Creatinine, Ser 0.55; Hemoglobin 9.7; Platelets 167; Potassium 4.5; Sodium 133   Recent Lipid Panel Lab Results  Component Value Date/Time   CHOL 234 (H) 06/07/2019 10:46 AM   TRIG 208 (H) 06/07/2019 10:46 AM   HDL 47 06/07/2019 10:46 AM   CHOLHDL 5.0 (H) 06/07/2019 10:46 AM   LDLCALC 149 (H) 06/07/2019 10:46 AM    Physical Exam:    VS:  BP (!) 171/89 (BP Location: Left Arm, Patient Position: Sitting)   Pulse (!) 108   Ht 5\' 2"  (1.575 m)   Wt 172 lb 8 oz (78.2 kg)   SpO2 98%   BMI 31.55 kg/m     Wt Readings from Last 3 Encounters:  11/09/20 172 lb 8 oz (78.2 kg)  10/05/20 175 lb (79.4 kg)  10/04/20 171 lb (77.6 kg)     GEN:  Well nourished, well developed in no acute distress HEENT: Normal NECK: No JVD; No carotid bruits LYMPHATICS: No lymphadenopathy CARDIAC: RRR, no murmurs, rubs, gallops RESPIRATORY:  Clear to auscultation without rales, wheezing or rhonchi  ABDOMEN: Soft, non-tender, non-distended MUSCULOSKELETAL:  No edema; No deformity  SKIN: Warm and dry NEUROLOGIC:  Alert and oriented x 3 PSYCHIATRIC:  Normal affect    Risk Assessment/Risk Calculators:                 ASSESSMENT & PLAN:    Postpartum hypertension Hyperlipidemia Obesity  Unfortunately her blood pressure is  elevated in the office today.  This was taken by me as well manually and on her automated blood pressure cuff.  She is only taking her amlodipine and has stopped the hydrochlorothiazide.  She also tells  me that she has been having some palpitations and is concerned about this. I like to start the patient on propanolol 20 mg twice a day hopefully this will help with her blood pressure as well as her palpitations.  I advised the patient about compliance with her antihypertensives and she assured me that she is taking this medication.  I am hoping we can get some improvement with this.  She will take her blood pressure for me the next week and she will send me this information via MyChart.  Hyperlipidemia-advised the patient to hold off on taking her statins right now given the fact that she is breast-feeding.  Once she is no longer breast-feeding we will reassess her lipid profile and then give her statins if needed.  The patient understands the need to lose weight with diet and exercise. We have discussed specific strategies for this.  Patient Instructions  Medication Instructions:  Your physician has recommended you make the following change in your medication:  START: Propranolol 20 mg twice daily *If you need a refill on your cardiac medications before your next appointment, please call your pharmacy*   Lab Work: None If you have labs (blood work) drawn today and your tests are completely normal, you will receive your results only by: MyChart Message (if you have MyChart) OR A paper copy in the mail If you have any lab test that is abnormal or we need to change your treatment, we will call you to review the results.   Testing/Procedures: None   Follow-Up: At [facility], you and your health needs are our priority.  As part of our continuing mission to provide you with exceptional heart care, we have created designated Provider Care Teams.  These Care Teams include your primary  Cardiologist (physician) and Advanced Practice Providers (APPs -  Physician Assistants and Nurse Practitioners) who all work together to provide you with the care you need, when you need it.  We recommend signing up for the patient portal called "MyChart".  Sign up information is provided on this After Visit Summary.  MyChart is used to connect with patients for Virtual Visits (Telemedicine).  Patients are able to view lab/test results, encounter notes, upcoming appointments, etc.  Non-urgent messages can be sent to your provider as well.   To learn more about what you can do with MyChart, go to [email or web address].    Your next appointment:   3 month(s)  The format for your next appointment:   In Person  Provider:   Thomasene Ripple, DO [address], [place], [place] [address]    Other Instructions     Dispo:  No follow-ups on file.   Medication Adjustments/Labs and Tests Ordered: Current medicines are reviewed at length with the patient today.  Concerns regarding medicines are outlined above.  Tests Ordered: No orders of the defined types were placed in this encounter.  Medication Changes: Meds ordered this encounter  Medications   propranolol (INDERAL) 20 MG tablet    Sig: Take 1 tablet (20 mg total) by mouth 2 (two) times daily.    Dispense:  180 tablet    Refill:  3

## 2021-01-25 ENCOUNTER — Encounter: Payer: Self-pay | Admitting: Internal Medicine

## 2021-01-25 ENCOUNTER — Other Ambulatory Visit: Payer: Self-pay

## 2021-01-25 ENCOUNTER — Ambulatory Visit (INDEPENDENT_AMBULATORY_CARE_PROVIDER_SITE_OTHER): Payer: No Typology Code available for payment source | Admitting: Internal Medicine

## 2021-01-25 VITALS — BP 128/82 | HR 88 | Ht 62.0 in | Wt 173.0 lb

## 2021-01-25 DIAGNOSIS — E0500 Thyrotoxicosis with diffuse goiter without thyrotoxic crisis or storm: Secondary | ICD-10-CM | POA: Diagnosis not present

## 2021-01-25 DIAGNOSIS — Z8632 Personal history of gestational diabetes: Secondary | ICD-10-CM | POA: Diagnosis not present

## 2021-01-25 LAB — HEMOGLOBIN A1C: Hgb A1c MFr Bld: 6.6 % — ABNORMAL HIGH (ref 4.6–6.5)

## 2021-01-25 LAB — T4, FREE: Free T4: 0.86 ng/dL (ref 0.60–1.60)

## 2021-01-25 LAB — TSH: TSH: 1.41 u[IU]/mL (ref 0.35–5.50)

## 2021-01-25 LAB — T3, FREE: T3, Free: 3.7 pg/mL (ref 2.3–4.2)

## 2021-01-25 NOTE — Patient Instructions (Signed)
Please continue off methimazole for now.  Please stop at the lab.  Please come back for a follow-up appointment in 6 months.   

## 2021-01-25 NOTE — Progress Notes (Signed)
Patient ID: Tammy Byrd, female   DOB: 12/31/1980, 40 y.o.   MRN: [id number]   This visit occurred during the SARS-CoV-2 public health emergency.  Safety protocols were in place, including screening questions prior to the visit, additional usage of staff PPE, and extensive cleaning of exam room while observing appropriate contact time as indicated for disinfecting solutions.   HPI  Tammy Byrd is a 40 y.o.-year-old femaleyear-old female, returning for follow-up for Graves' disease.  Last visit 6 months ago.  She is here with her husband and her baby.  Interim history: She gave birth in 07/2020-has a boy.  She also has 3 girls.  She had preeclampsia with the latest pregnancy. Baby is doing well. She still has HTN (on Amlodipine now) and has had tachycardia at previous appointments (she was recommended metoprolol, but did not start it yet), but no tremors, palpitations, heat intolerance.  This  Reviewed history: She had 2 UTIs in 07/2016 and started to feel poorly afterwards.  She saw her PCP in 08/2016 for palpitations, fatigue, increased sweating.  TFTs were in the thyrotoxic range.  She was referred to endocrinology.  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 and 5 mg in p.m. in 11/2016.  We also started atenolol 25 mg twice a day but decrease the dose to only once a day in the past due to dizziness.  In 11/2017: we were able to decrease the methimazole to 5 mg twice a day.  We continued atenolol.  In 04/2018: we decreased the methimazole to 5 mg once a day.  She did not return for labs in 1.5 months, as advised.  Upon questioning, she was occasionally missing methimazole doses.  In 10/2018: We decrease methimazole to 2.5 mg daily  In 05/2019: We stopped methimazole completely.  She continues off the  medication.  Reviewed her TFTs: 11/12/2020: TSH 1.64 Lab Results  Component Value Date   TSH 0.95 07/20/2020   TSH 1.140 04/11/2020   TSH 0.514 02/29/2020   TSH 1.52 01/20/2020   TSH 1.55 11/11/2019   TSH 2.010 11/07/2019   TSH 2.27 08/19/2019   TSH 1.56 07/14/2019   TSH 1.930 06/07/2019   TSH 2.63 11/05/2018   FREET4 0.77 07/20/2020   FREET4 0.97 04/11/2020   FREET4 0.91 01/20/2020   FREET4 0.96 11/11/2019   FREET4 1.23 11/07/2019   FREET4 1.00 08/19/2019   FREET4 0.96 07/14/2019   FREET4 1.28 06/07/2019   FREET4 1.04 11/05/2018   FREET4 0.82 04/30/2018    Pt denies: - feeling nodules in neck - hoarseness - dysphagia - choking - SOB with lying down  No family history of thyroid disease except thyroid cancer in mother.  No FH of thyroid cancer. No h/o radiation tx to head or neck.  No herbal supplements. No Biotin use. No recent steroids use.   She has a history of HTN/preeclampsia and GDM during previous pregnancy.  She had severe preeclampsia with  the latest pregnancy.  Reviewed HbA1c levels: 11/12/2020: HbA1c 6.0% Lab Results  Component Value Date   HGBA1C 6.3 (A) 07/20/2020   HGBA1C 6.2 (H) 02/29/2020   HGBA1C 6.0 (H) 11/07/2019   She was started on metformin 500 by High Risk Ob >> increased to 1000 mg at bedtime (increased 07/2020) >> now off. She is not checking blood  sugars at home currently.  Labs reviewed current records from PCP: 11/12/2020: Glucose 102, BUN/creatinine/0.51, GFR 121 Lipids: 330/167/61/237 -she was advised to reduce red meat, dairy, and fried foods.  Also, she was given other instructions about improving diet.  She was not started on a statin as she was breast-feeding.  This is followed by PCP.  ROS: + see HPI  I reviewed pt's medications, allergies, PMH, social hx, family hx, and changes were documented in the history of present illness. Otherwise, unchanged from my initial visit note.  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    GDM (gestational diabetes mellitus)    Gestational diabetes    History of severe pre-eclampsia    Pregnancy induced hypertension    Past Surgical History:  Procedure Laterality Date   CESAREAN SECTION N/A 09/04/2015   Procedure: CESAREAN SECTION;  Surgeon: Levie Heritage, DO;  Location: [facility] BIRTHING SUITES;  Service: Obstetrics;  Laterality: N/A;   NO PAST SURGERIES     Social History   Social History   Marital status: Single,    Spouse name: N/A   Number of children: 4   Occupational History   Dental asstnt   Social History Main Topics   Smoking status: Never Smoker   Smokeless tobacco: Never Used   Alcohol use No   Drug use: No   Current Outpatient Medications  Medication Sig Dispense Refill   acetaminophen (TYLENOL) 325 MG tablet Take 650 mg by mouth every 6 (six) hours as needed for mild pain or headache.     amLODipine (NORVASC) 5 MG tablet Take 1 tablet (5 mg total) by mouth daily. 180 tablet 3   ferrous gluconate (FERGON) 324 MG tablet Take 1 tablet (324 mg total) by mouth daily with breakfast. 90 tablet 3   hydrochlorothiazide (HYDRODIURIL) 25 MG tablet Take 1 tablet (25 mg total) by mouth daily. 30 tablet 0   ibuprofen (ADVIL) 600 MG tablet Take 1 tablet (600 mg total) by mouth every 6 (six) hours as needed. (Patient taking differently: Take 600 mg by mouth every 6 (six) hours as needed for headache, mild pain or moderate pain.) 30 tablet 0   Prenatal Vit-Fe Fumarate-FA (PRENATAL PO) Take 1 capsule by mouth daily. Unknown strength     propranolol (INDERAL) 20 MG tablet Take 1 tablet (20 mg total) by mouth 2 (two) times daily. 180 tablet 3   No current facility-administered medications for this visit.   Allergies  Allergen Reactions   Hydrochlorothiazide Rash   Latex Rash    BUMPS AND SWELLING   Family History  Problem Relation Age of Onset   Kidney disease Mother    Heart disease Father    PE: BP  128/82 (BP Location: Right Arm, Patient Position: Sitting, Cuff Size: Normal)   Pulse 88   Ht 5\' 2"  (1.575 m)   Wt 173 lb (78.5 kg)   SpO2 98%   BMI 31.64 kg/m  Wt Readings from Last 3 Encounters:  01/25/21 173 lb (78.5 kg)  11/09/20 172 lb 8 oz (78.2 kg)  10/05/20 175 lb (79.4 kg)   Constitutional: overweight, in NAD Eyes: PERRLA, EOMI, no exophthalmos ENT: moist mucous membranes, + mild symmetric thyromegaly, no cervical lymphadenopathy Cardiovascular: RRR, No MRG Respiratory: CTA B Musculoskeletal: no deformities, strength intact in all 4 Skin: moist, warm, no rashes Neurological: no tremor with outstretched hands, DTR normal in all 4  ASSESSMENT:  1.  Graves'  disease  2. H/o gestational diabetes  PLAN:  1. Patient with history of thyrotoxicosis (low TSH, high free thyroid hormones), with initial thyrotoxic symptoms: Weight loss, heat intolerance, hyper defecation, palpitations, anxiety.  Her TSI antibodies were elevated, pointing towards a diagnosis of Graves' disease we did not perform a thyroid uptake and scan -We initially started her on methimazole 10 mg in a.m. and 5 mg in p.m. and atenolol milligrams twice a day.  She developed dizziness on the atenolol so we decreased the dose to only once daily, which she tolerated well, however, she was able to come off completely afterwards. -We were able to stop methimazole 05/2019 with subsequent normal TFTs -She continues off methimazole -She denies thyrotoxic signs or symptoms, but she has high blood pressure for which she started amlodipine.  She was also suggested a beta-blocker for tachycardia, but she did not start this yet she feels that her blood pressure and pulse are running high when she comes to the Dr. Marland KitchenWe will recheck her TFTs today -No signs of active Graves' ophthalmopathy: Double vision, blurry vision, eye pain, chemosis -I will see her back in 6 months  2.  Gestational diabetes -She also had this with a previous  pregnancy, but did not require insulin -Latest HbA1c was reviewed and this was 6.3% in 07/2020, higher, previously, 6.2% in 02/2020 -During the pregnancy, she was started (by high risk OB/GYN) on metformin 500 mg daily and then increase to 1000 mg daily in 07/2020. -She came off metformin after she gave birth.  She did have severe preeclampsia 07/2020. -At this visit, we will recheck her HbA1c  Component     Latest Ref Rng & Units 01/25/2021  TSH     0.35 - 5.50 uIU/mL 1.41  T4,Free(Direct)     0.60 - 1.60 ng/dL 0.17  Triiodothyronine,Free,Serum     2.3 - 4.2 pg/mL 3.7  Hemoglobin A1C     4.6 - 6.5 % 6.6 (H)  TFTs are normal.  No need to restart methimazole.  However, HbA1c is higher, now in the diabetic range. Will discuss with her about improving diet and recheck the HbA1c at next visit.  Carlus Pavlov, MD PhD [facility] Endocr[facility]

## 2021-02-01 ENCOUNTER — Ambulatory Visit: Payer: No Typology Code available for payment source | Admitting: Cardiology

## 2021-02-22 ENCOUNTER — Ambulatory Visit: Payer: No Typology Code available for payment source | Admitting: Cardiology

## 2021-03-22 ENCOUNTER — Ambulatory Visit
Admission: RE | Admit: 2021-03-22 | Discharge: 2021-03-22 | Disposition: A | Discharge status: Home / Self Care | Payer: No Typology Code available for payment source | Source: Ambulatory Visit | Attending: Medical | Admitting: Medical

## 2021-03-22 ENCOUNTER — Other Ambulatory Visit: Payer: Self-pay

## 2021-03-26 ENCOUNTER — Other Ambulatory Visit: Payer: Self-pay | Admitting: Cardiology

## 2021-03-26 MED ORDER — AMLODIPINE BESYLATE 5 MG PO TABS: 5.0000 mg | ORAL_TABLET | Freq: Every day | ORAL | 3 refills | Status: AC

## 2021-04-04 ENCOUNTER — Other Ambulatory Visit: Payer: Self-pay | Admitting: Medical

## 2021-05-23 ENCOUNTER — Inpatient Hospital Stay: Admission: RE | Admit: 2021-05-23 | Payer: No Typology Code available for payment source | Source: Ambulatory Visit

## 2021-07-26 ENCOUNTER — Encounter: Payer: Self-pay | Admitting: Internal Medicine

## 2021-07-26 ENCOUNTER — Ambulatory Visit: Payer: No Typology Code available for payment source | Admitting: Internal Medicine

## 2021-07-26 VITALS — BP 128/90 | HR 83 | Ht 62.0 in | Wt 160.8 lb

## 2021-07-26 DIAGNOSIS — E0500 Thyrotoxicosis with diffuse goiter without thyrotoxic crisis or storm: Secondary | ICD-10-CM

## 2021-07-26 DIAGNOSIS — E1165 Type 2 diabetes mellitus with hyperglycemia: Secondary | ICD-10-CM | POA: Diagnosis not present

## 2021-07-26 LAB — TSH: TSH: 1.37 u[IU]/mL (ref 0.35–5.50)

## 2021-07-26 LAB — T3, FREE: T3, Free: 3.2 pg/mL (ref 2.3–4.2)

## 2021-07-26 LAB — T4, FREE: Free T4: 0.84 ng/dL (ref 0.60–1.60)

## 2021-07-26 LAB — HEMOGLOBIN A1C: Hgb A1c MFr Bld: 6.6 % — ABNORMAL HIGH (ref 4.6–6.5)

## 2021-07-26 NOTE — Progress Notes (Addendum)
Patient ID: Tammy Byrd, female   DOB: [birth year], 41 y.o.   MRN: [id number]   HPI  Tammy Byrd is a 41 y.o.-year-old female, returning for follow-up for Graves' disease.  She also has a new diagnosis of type II diabetes.  Last visit 6 months ago.   Interim history: No tremors, palpitations, heat intolerance.  Reviewed history: She had 2 UTIs in 07/2016 and started to feel poorly afterwards.  She saw her PCP in 08/2016 for palpitations, fatigue, increased sweating.  TFTs were in the thyrotoxic range.  She was referred to endocrinology.  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 and 5 mg in p.m. in 11/2016.  We also started atenolol 25 mg twice a day but decrease the dose to only once a day in the past due to dizziness.  In 11/2017: we were able to decrease the methimazole to 5 mg twice a day.  We continued atenolol.  In 04/2018: we decreased the methimazole to 5 mg once a day.  She did not return for labs in 1.5 months, as advised.  Upon questioning, she was occasionally missing methimazole doses.  In 10/2018: We decrease methimazole to 2.5 mg daily  In 05/2019: We stopped methimazole completely.  She continues off the medication.  Reviewed her TFTs: Lab Results  Component Value Date   TSH 1.41 01/25/2021   TSH 0.95 07/20/2020   TSH 1.140 04/11/2020   TSH 0.514 02/29/2020   TSH 1.52 01/20/2020   TSH 1.55 11/11/2019   TSH 2.010 11/07/2019   TSH 2.27 08/19/2019   TSH 1.56 07/14/2019   TSH 1.930 06/07/2019   FREET4 0.86 01/25/2021   FREET4 0.77 07/20/2020   FREET4 0.97 04/11/2020   FREET4 0.91 01/20/2020   FREET4 0.96 11/11/2019   FREET4 1.23 11/07/2019   FREET4 1.00 08/19/2019   FREET4 0.96 07/14/2019   FREET4 1.28 06/07/2019   FREET4 1.04 11/05/2018   Lab Results  Component Value Date    T3FREE 3.7 01/25/2021   T3FREE 2.5 07/20/2020   T3FREE 3.0 01/20/2020   T3FREE 3.1 11/11/2019   T3FREE 2.7 11/07/2019   T3FREE 3.0 08/19/2019   T3FREE 3.4 07/14/2019   T3FREE 3.5 11/05/2018   T3FREE 3.0 04/30/2018   T3FREE 2.9 12/11/2017  11/12/2020: TSH 1.64  Pt denies: - feeling nodules in neck - hoarseness - dysphagia - choking  No family history of thyroid disease except thyroid cancer in mother.  No FH of thyroid cancer. No h/o radiation tx to head or neck. No herbal supplements. No Biotin use. No recent steroids use.   Reviewed HbA1c levels: 06/07/2021:  HbA1c 6.6% Lab Results  Component Value Date   HGBA1C 6.6 (H) 01/25/2021   HGBA1C 6.3 (A) 07/20/2020   HGBA1C 6.2 (H) 02/29/2020   HGBA1C 6.0 (H) 11/07/2019  11/12/2020: HbA1c 6.0%  She was started on metformin 500 by High Risk Ob >> increased to 1000 mg at bedtime (increased 07/2020) >> now off. She is not checking blood sugars at home currently.  Labs reviewed current records from PCP: 05/30/2021:  Glucose 98, BUN/creatinine 16/0.52, GFR 119 Lipids: 263/103/59/186-will start on Crestor 20 mg daily after she finishes b'feeding 11/12/2020: Glucose 102, BUN/creatinine/0.51, GFR 121 Lipids: 330/167/61/237 -she was advised to reduce red meat, dairy, and fried foods.  Also, she was given other  instructions about improving diet.  She was not started on a statin as she was breast-feeding.  This is followed by PCP.  She gave birth in 07/2020-has a boy.  She had preeclampsia with the latest pregnancy.  She also has 3 girls.  ROS: + see HPI  I reviewed pt's medications, allergies, PMH, social hx, family hx, and changes were documented in the history of present illness. Otherwise, unchanged from my initial visit note.  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    GDM (gestational diabetes mellitus)    Gestational diabetes    History of severe pre-eclampsia     Pregnancy induced hypertension    Past Surgical History:  Procedure Laterality Date   CESAREAN SECTION N/A 09/04/2015   Procedure: CESAREAN SECTION;  Surgeon: Levie Heritage, DO;  [facility];  Service: Obstetrics;  Laterality: N/A;   NO PAST SURGERIES     Social History   Social History   Marital status: Single,    Spouse name: N/A   Number of children: 4   Occupational History   Dental asstnt   Social History Main Topics   Smoking status: Never Smoker   Smokeless tobacco: Never Used   Alcohol use No   Drug use: No   Current Outpatient Medications  Medication Sig Dispense Refill   acetaminophen (TYLENOL) 325 MG tablet Take 650 mg by mouth every 6 (six) hours as needed for mild pain or headache.     amLODipine (NORVASC) 5 MG tablet Take 1 tablet (5 mg total) by mouth daily. 180 tablet 3   ferrous gluconate (FERGON) 324 MG tablet Take 1 tablet (324 mg total) by mouth daily with breakfast. 90 tablet 3   ibuprofen (ADVIL) 600 MG tablet Take 1 tablet (600 mg total) by mouth every 6 (six) hours as needed. (Patient taking differently: Take 600 mg by mouth every 6 (six) hours as needed for headache, mild pain or moderate pain.) 30 tablet 0   Prenatal Vit-Fe Fumarate-FA (PRENATAL PO) Take 1 capsule by mouth daily. Unknown strength     propranolol (INDERAL) 20 MG tablet Take 1 tablet (20 mg total) by mouth 2 (two) times daily. 180 tablet 3   No current facility-administered medications for this visit.   Allergies  Allergen Reactions   Hydrochlorothiazide Rash   Latex Rash    BUMPS AND SWELLING   Family History  Problem Relation Age of Onset   Kidney disease Mother    Heart disease Father    PE: BP 128/90 (BP Location: Right Arm, Patient Position: Sitting, Cuff Size: Normal)   Pulse 83   Ht 5\' 2"  (1.575 m)   Wt 160 lb 12.8 oz (72.9 kg)   SpO2 99%   BMI 29.41 kg/m  Wt Readings from Last 3 Encounters:  07/26/21 160 lb 12.8 oz (72.9 kg)  01/25/21 173 lb  (78.5 kg)  11/09/20 172 lb 8 oz (78.2 kg)   Constitutional: overweight, in NAD Eyes: EOMI, no exophthalmos ENT: moist mucous membranes, + mild, symmetric, thyromegaly, no cervical lymphadenopathy Cardiovascular: RRR, No MRG Respiratory: CTA B Musculoskeletal: no deformities Skin: moist, warm, no rashes Neurological: no tremor with outstretched hands  ASSESSMENT:  1.  Graves' disease  2.  Type 2 diabetes - H/o gestational diabetes  PLAN:  1. Patient with history of overt thyrotoxicosis, with initial thyrotoxic symptoms: Weight loss, heat intolerance, hyperdefecation, palpitations, anxiety.  Her TSI antibodies were elevated, pointing towards  a diagnosis of Graves' disease so we did not perform a thyroid uptake and scan -We initially started her on methimazole 10 mg in a.m. and 5 mg in p.m. and also atenolol twice a day she developed dizziness on the atenolol so we decreased the dose to only once daily, which she tolerated well, however, she was able to come off completely afterwards. -We were able to stop methimazole in 05/2019 with subsequently normal TFTs -At last visit, she had high blood pressure-on amlodipine.  She was also suggested a beta-blocker for tachycardia but she did not start this as she felt that her blood sugars and pulse were whitecoat related. At today's visit, she denies thyrotoxic signs and symptoms.  Pulse is not elevated in BP is 128/90. -We will recheck her TFTs today -No signs of active Graves' ophthalmopathy: No double vision, no blurry vision, eye pain, chemosis -I will see her back in 6 months  2.  Type 2 diabetes - she had GDM, not requiring insulin, but was on metformin.  She came off metformin after she gave birth.   -At last visit, HbA1c was higher, at 6.6%.  We discussed that this is in the diabetic range.  Discussed about improving diet. -She had another HbA1c, which was still 6.6% on 06/07/2021 per check by PCP.  Based on these 2 HbA1c levels, she now  has a diagnosis of diabetes. -Since then, she started to improve her diet and she was able to lose 13 pounds! -She has an appointment coming up with PCP later this year to recheck her HbA1c -At today's visit, we discussed about starting to check blood sugars once a day, rotating check times.  Discussed about CBG targets. -I also suggested dietary changes. -We will add an HbA1c at today's visit  Component     Latest Ref Rng 07/26/2021  TSH     0.35 - 5.50 uIU/mL 1.37   Triiodothyronine,Free,Serum     2.3 - 4.2 pg/mL 3.2   T4,Free(Direct)     0.60 - 1.60 ng/dL [id number]   Hemoglobin [id number]     4.6 - 6.5 % 6.6 (H)     TFTs are normal.  HbA1c remains stable.  Carlus Pavlovristina Lani Mendiola, MD PhD [facility]

## 2021-07-26 NOTE — Patient Instructions (Addendum)
Please continue off methimazole for now.  Please stop at the lab.  Start checking blood sugars before or 2h after meals.  Please come back for a follow-up appointment in 6 months.  PATIENT INSTRUCTIONS FOR TYPE 2 DIABETES:  DIET AND EXERCISE Diet and exercise is an important part of diabetic treatment.  We recommended aerobic exercise in the form of brisk walking (working between 40-60% of maximal aerobic capacity, similar to brisk walking) for 150 minutes per week (such as 30 minutes five days per week) along with 3 times per week performing 'resistance' training (using various gauge rubber tubes with handles) 5-10 exercises involving the major muscle groups (upper body, lower body and core) performing 10-15 repetitions (or near fatigue) each exercise. Start at half the above goal but build slowly to reach the above goals. If limited by weight, joint pain, or disability, we recommend daily walking in a swimming pool with water up to waist to reduce pressure from joints while allow for adequate exercise.    BLOOD GLUCOSES Monitoring your blood glucoses is important for continued management of your diabetes. Please check your blood glucoses 2-4 times a day: fasting, before meals and at bedtime (you can rotate these measurements - e.g. one day check before the 3 meals, the next day check before 2 of the meals and before bedtime, etc.).   HYPOGLYCEMIA (low blood sugar) Hypoglycemia is usually a reaction to not eating, exercising, or taking too much insulin/ other diabetes drugs.  Symptoms include tremors, sweating, hunger, confusion, headache, etc. Treat IMMEDIATELY with 15 grams of Carbs: 4 glucose tablets  cup regular juice/soda 2 tablespoons raisins 4 teaspoons sugar 1 tablespoon honey Recheck blood glucose in 15 mins and repeat above if still symptomatic/blood glucose <100.  RECOMMENDATIONS TO REDUCE YOUR RISK OF DIABETIC COMPLICATIONS: * Take your prescribed MEDICATION(S) * Follow a  DIABETIC diet: Complex carbs, fiber rich foods, (monounsaturated and polyunsaturated) fats * AVOID saturated/trans fats, high fat foods, >2,300 mg salt per day. * EXERCISE at least 5 times a week for 30 minutes or preferably daily.  * DO NOT SMOKE OR DRINK more than 1 drink a day. * Check your FEET every day. Do not wear tightfitting shoes. Contact us if you develop an ulcer * See your EYE doctor once a year or more if needed * Get a FLU shot once a year * Get a PNEUMONIA vaccine once before and once after age 31 years  GOALS:  * Your Hemoglobin A1c of <7%  * fasting sugars need to be <130 * after meals sugars need to be <180 (2h after you start eating) * Your Systolic BP should be [id number] or lower  * Your Diastolic BP should be 80 or lower  * Your HDL (Good Cholesterol) should be 40 or higher  * Your LDL (Bad Cholesterol) should be 100 or lower. * Your Triglycerides should be 150 or lower  * Your Urine microalbumin (kidney function) should be <30 * Your Body Mass Index should be 25 or lower   Please consider the following ways to cut down carbs and fat and increase fiber and micronutrients in your diet: - substitute whole grain for white bread or pasta - substitute brown rice for white rice - substitute 90-calorie flat bread pieces for slices of bread when possible - substitute sweet potatoes or yams for white potatoes - substitute humus for margarine - substitute tofu for cheese when possible - substitute almond or rice milk for regular milk (would not drink soy  milk daily due to concern for soy estrogen influence on breast cancer risk) - substitute dark chocolate for other sweets when possible - substitute water - can add lemon or orange slices for taste - for diet sodas (artificial sweeteners will trick your body that you can eat sweets without getting calories and will lead you to overeating and weight gain in the long run) - do not skip breakfast or other meals (this will slow down  the metabolism and will result in more weight gain over time)  - can try smoothies made from fruit and almond/rice milk in am instead of regular breakfast - can also try old-fashioned (not instant) oatmeal made with almond/rice milk in am - order the dressing on the side when eating salad at a restaurant (pour less than half of the dressing on the salad) - eat as little meat as possible - can try juicing, but should not forget that juicing will get rid of the fiber, so would alternate with eating raw veg./fruits or drinking smoothies - use as little oil as possible, even when using olive oil - can dress a salad with a mix of balsamic vinegar and lemon juice, for e.g. - use agave nectar, stevia sugar, or regular sugar rather than artificial sweateners - steam or broil/roast veggies  - snack on veggies/fruit/nuts (unsalted, preferably) when possible, rather than processed foods - reduce or eliminate aspartame in diet (it is in diet sodas, chewing gum, etc) Read the labels!  Try to read Dr. Janene Harvey book: "Program for Reversing Diabetes" for other ideas for healthy eating.

## 2021-09-26 ENCOUNTER — Inpatient Hospital Stay: Admission: RE | Admit: 2021-09-26 | Payer: No Typology Code available for payment source | Source: Ambulatory Visit

## 2021-09-26 ENCOUNTER — Other Ambulatory Visit: Payer: Self-pay | Admitting: Medical

## 2021-09-26 DIAGNOSIS — Z1231 Encounter for screening mammogram for malignant neoplasm of breast: Secondary | ICD-10-CM

## 2021-11-21 ENCOUNTER — Other Ambulatory Visit: Payer: Self-pay | Admitting: Family Medicine

## 2021-11-21 DIAGNOSIS — Z1231 Encounter for screening mammogram for malignant neoplasm of breast: Secondary | ICD-10-CM

## 2021-11-28 ENCOUNTER — Inpatient Hospital Stay: Admission: RE | Admit: 2021-11-28 | Payer: No Typology Code available for payment source | Source: Ambulatory Visit

## 2022-02-07 ENCOUNTER — Ambulatory Visit: Payer: No Typology Code available for payment source | Admitting: Internal Medicine

## 2022-03-28 ENCOUNTER — Encounter: Payer: Self-pay | Admitting: Internal Medicine

## 2022-03-28 ENCOUNTER — Ambulatory Visit: Payer: Self-pay | Admitting: Internal Medicine

## 2022-03-28 VITALS — BP 136/80 | HR 93 | Ht 62.0 in | Wt 173.1 lb

## 2022-03-28 DIAGNOSIS — E0500 Thyrotoxicosis with diffuse goiter without thyrotoxic crisis or storm: Secondary | ICD-10-CM

## 2022-03-28 DIAGNOSIS — E1165 Type 2 diabetes mellitus with hyperglycemia: Secondary | ICD-10-CM

## 2022-03-28 LAB — POCT GLYCOSYLATED HEMOGLOBIN (HGB A1C): Hemoglobin A1C: 6.5 % — AB (ref 4.0–5.6)

## 2022-03-28 NOTE — Progress Notes (Unsigned)
Patient ID: Tammy Byrd, female   DOB: March 21, 1980, 42 y.o.   MRN: [id number]   HPI  Tammy Byrd is a 42 y.o.-year-old female, returning for follow-up for Graves' disease.  She also has a new diagnosis of type II diabetes.  Last visit 6 months ago.  She is here with her daughter.  Interim history: No tremors, palpitations, heat intolerance. She gained 13 lbs. She has anxiety. She is still breastfeeding.  Reviewed history: She had 2 UTIs in 07/2016 and started to feel poorly afterwards.  She saw her PCP in 08/2016 for palpitations, fatigue, increased sweating.  TFTs were in the thyrotoxic range.  She was referred to endocrinology.  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 and 5 mg in p.m. in 11/2016.  We also started atenolol 25 mg twice a day but decrease the dose to only once a day in the past due to dizziness.  In 11/2017: we were able to decrease the methimazole to 5 mg twice a day.  We continued atenolol.  In 04/2018: we decreased the methimazole to 5 mg once a day.  She did not return for labs in 1.5 months, as advised.  Upon questioning, she was occasionally missing methimazole doses.  In 10/2018: We decrease methimazole to 2.5 mg daily  In 05/2019: We stopped methimazole completely.  She continues off the medication.  Reviewed her TFTs: Lab Results  Component Value Date   TSH 1.37 07/26/2021   TSH 1.41 01/25/2021   TSH 0.95 07/20/2020   TSH 1.140 04/11/2020   TSH 0.514 02/29/2020   TSH 1.52 01/20/2020   TSH 1.55 11/11/2019   TSH 2.010 11/07/2019   TSH 2.27 08/19/2019   TSH 1.56 07/14/2019   FREET4 0.84 07/26/2021   FREET4 0.86 01/25/2021   FREET4 0.77 07/20/2020   FREET4 0.97 04/11/2020   FREET4 0.91 01/20/2020   FREET4 0.96 11/11/2019   FREET4 1.23 11/07/2019   FREET4 1.00  08/19/2019   FREET4 0.96 07/14/2019   FREET4 1.28 06/07/2019   Lab Results  Component Value Date   T3FREE 3.2 07/26/2021   T3FREE 3.7 01/25/2021   T3FREE 2.5 07/20/2020   T3FREE 3.0 01/20/2020   T3FREE 3.1 11/11/2019   T3FREE 2.7 11/07/2019   T3FREE 3.0 08/19/2019   T3FREE 3.4 07/14/2019   T3FREE 3.5 11/05/2018   T3FREE 3.0 04/30/2018  11/12/2020: TSH 1.64  Pt denies: - feeling nodules in neck - hoarseness - dysphagia - choking  No family history of thyroid disease except thyroid cancer in mother.  No FH of thyroid cancer. No h/o radiation tx to head or neck. No herbal supplements. No Biotin use. No recent steroids use.   Reviewed HbA1c levels: Lab Results  Component Value Date   HGBA1C 6.6 (H) 07/26/2021   HGBA1C 6.6 (H) 01/25/2021   HGBA1C 6.3 (A) 07/20/2020   HGBA1C 6.2 (H) 02/29/2020   HGBA1C 6.0 (H) 11/07/2019  06/07/2021:  HbA1c 6.6% 11/12/2020: HbA1c 6.0%  She was started on metformin 500 by High Risk Ob >> increased to 1000 mg at bedtime (increased 07/2020) >> now off. She is not checking blood sugars at home currently.  She checks sugars 1x a day: - am: 90-110 - 2h after b'fast: 90s - lunch: n/c - 2h after lunch: 90s - dinner: n/c - 2h after  dinner: 101-125 - bedtime: n/c  Labs reviewed current records from PCP: 05/30/2021:  Glucose 98, BUN/creatinine 16/0.52, GFR 119 Lipids: 263/103/59/186-will start on Crestor 20 mg daily after she finishes b'feeding 11/12/2020: Glucose 102, BUN/creatinine/0.51, GFR 121 Lipids: 330/167/61/237 -she was advised to reduce red meat, dairy, and fried foods.  Also, she was given other instructions about improving diet.  She was not started on a statin as she was breast-feeding.  This is followed by PCP.  She gave birth in 07/2020-has a boy.  She had preeclampsia with the latest pregnancy.  She also has 3 girls.  She had an eye exam 02/28/2022: No DR.  ROS: + see HPI  I reviewed pt's medications, allergies, PMH, social  hx, family hx, and changes were documented in the history of present illness. Otherwise, unchanged from my initial visit note.  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    GDM (gestational diabetes mellitus)    Gestational diabetes    History of severe pre-eclampsia    Pregnancy induced hypertension    Past Surgical History:  Procedure Laterality Date   CESAREAN SECTION N/A 09/04/2015   Procedure: CESAREAN SECTION;  Surgeon: Truett Mainland, DO;  Location: [place];  Service: Obstetrics;  Laterality: N/A;   NO PAST SURGERIES     Social History   Social History   Marital status: Single,    Spouse name: N/A   Number of children: 4   Occupational History   Dental asstnt   Social History Main Topics   Smoking status: Never Smoker   Smokeless tobacco: Never Used   Alcohol use No   Drug use: No   Current Outpatient Medications  Medication Sig Dispense Refill   acetaminophen (TYLENOL) 325 MG tablet Take 650 mg by mouth every 6 (six) hours as needed for mild pain or headache.     ferrous gluconate (FERGON) 324 MG tablet Take 1 tablet (324 mg total) by mouth daily with breakfast. 90 tablet 3   ibuprofen (ADVIL) 600 MG tablet Take 1 tablet (600 mg total) by mouth every 6 (six) hours as needed. (Patient taking differently: Take 600 mg by mouth every 6 (six) hours as needed for headache, mild pain or moderate pain.) 30 tablet 0   Prenatal Vit-Fe Fumarate-FA (PRENATAL PO) Take 1 capsule by mouth daily. Unknown strength     amLODipine (NORVASC) 5 MG tablet Take 1 tablet (5 mg total) by mouth daily. 180 tablet 3   No current facility-administered medications for this visit.   Allergies  Allergen Reactions   Hydrochlorothiazide Rash   Latex Rash    BUMPS AND SWELLING   Family History  Problem Relation Age of Onset   Kidney disease Mother    Heart disease Father    PE: BP 136/80   Pulse 93   Ht 5' 2"$  (1.575 m)   Wt  173 lb 2 oz (78.5 kg)   BMI 31.66 kg/m  Wt Readings from Last 3 Encounters:  03/28/22 173 lb 2 oz (78.5 kg)  07/26/21 160 lb 12.8 oz (72.9 kg)  01/25/21 173 lb (78.5 kg)   Constitutional: overweight, in NAD Eyes: EOMI, no exophthalmos ENT: + mild, symmetric, thyromegaly, no cervical lymphadenopathy Cardiovascular: tachycardia, RR, No MRG Respiratory: CTA B Musculoskeletal: no deformities Skin: moist, warm, no rashes Neurological: no tremor with outstretched hands  ASSESSMENT:  1.  Graves' disease  2.  Type 2 diabetes - H/o gestational diabetes  PLAN:  1. Patient with history of thyrotoxicosis, with initial thyrotoxic symptoms: Weight loss, heat intolerance, hyperdefecation, palpitations, anxiety. Her TSI antibodies were elevated, pointing towards a diagnosis of Graves' disease so we did not perform a thyroid uptake and scan -We initially started her on methimazole 10 mg in a.m. and 5 mg in p.m. and also atenolol twice a day she developed dizziness on the atenolol so we decreased the dose to only once daily, which she tolerated well, however, she was able to come off methimazole completely in 05/2019, with subsequent normal TFTs -At last visit, TFTs were normal -We will reach these today -No signs of active Graves' ophthalmopathy: No double vision, blurry vision, eye pain, chemosis.  She recently had an eye exam which was not noted. -She is tachycardic today but she mentions anxiety with doctors appointments.  No tremors. -I will see her back in 6 months  2.  Type 2 diabetes -With history of GDM, not requiring insulin.  She came off metformin after she gave birth. -At last visit, HbA1c was 6.6%, stable. -Before last visit, she started to improve her diet and she was able to lose 13 pounds.  We discussed about further salutary dietary changes. -At that time, I recommended to start checking blood sugars once a day, rotating check times.  Discussed about CBG targets. -However, since  last visit, she gained 13 pounds. -She did start to check blood sugars, rotating check times and they are all at goal -At today's visit, HbA1c is 6.5% (lower) -I will see her back in 6 months  Philemon Kingdom, MD PhD [facility]

## 2022-03-28 NOTE — Patient Instructions (Signed)
Please continue off methimazole for now.  Please stop at the lab.  Please come back for a follow-up appointment in 6 months.

## 2022-03-29 LAB — TSH: TSH: 3.24 mIU/L

## 2022-03-29 LAB — T3, FREE: T3, Free: 3.2 pg/mL (ref 2.3–4.2)

## 2022-03-29 LAB — T4, FREE: Free T4: 1.3 ng/dL (ref 0.8–1.8)

## 2022-04-04 IMAGING — US US MFM OB FOLLOW-UP
1 series · 13 of 24 positions shown · non-contrast
Comparison: none

[Series 1: us mfm ob follow-up · 24 acquisitions, 13 frames shown]
[im 1/24]
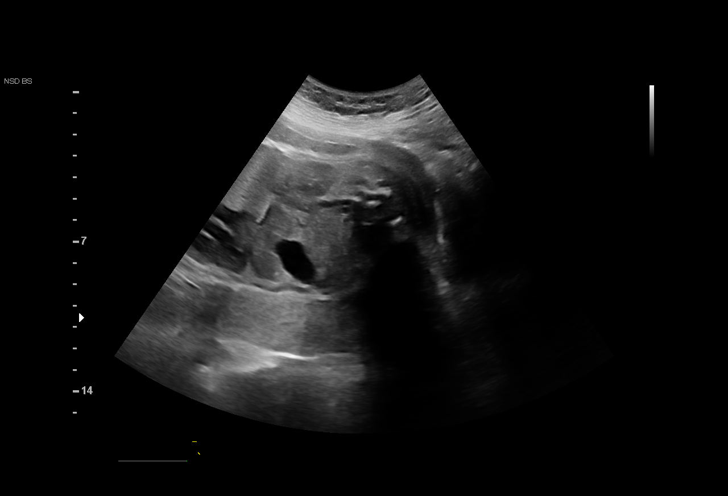
[im 3/24]
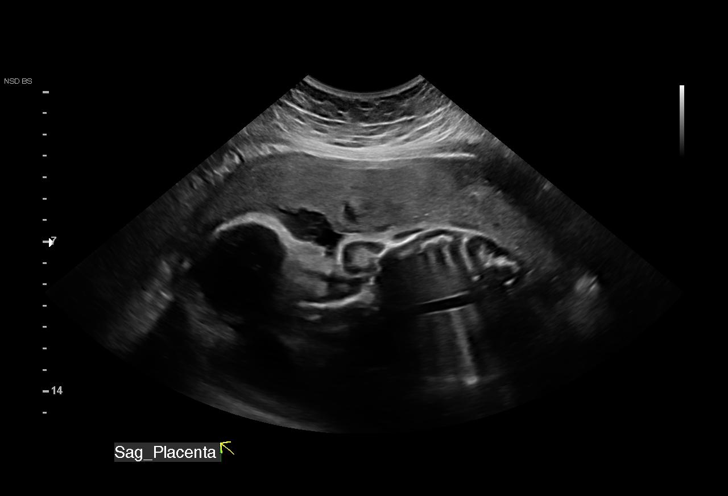
[im 5/24]
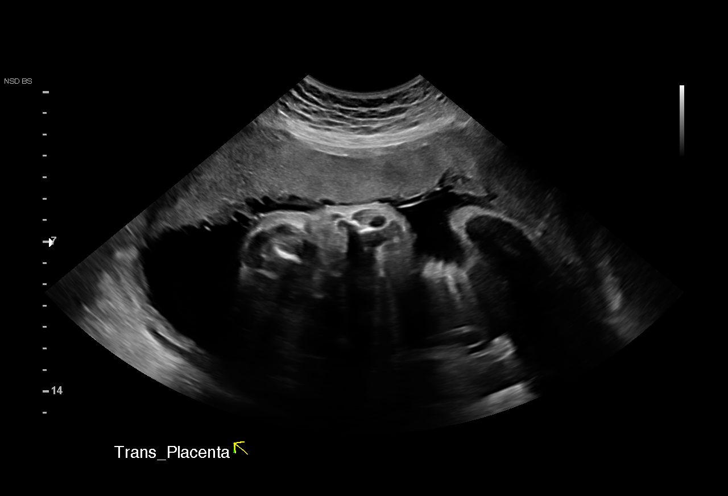
[im 7/24]
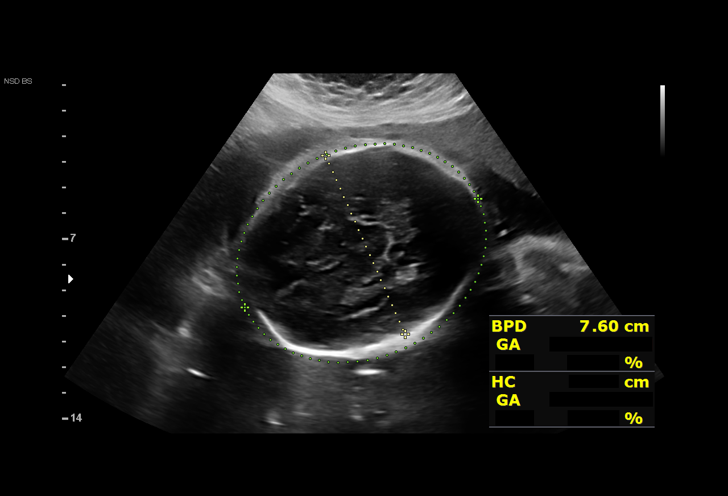
[im 9/24]
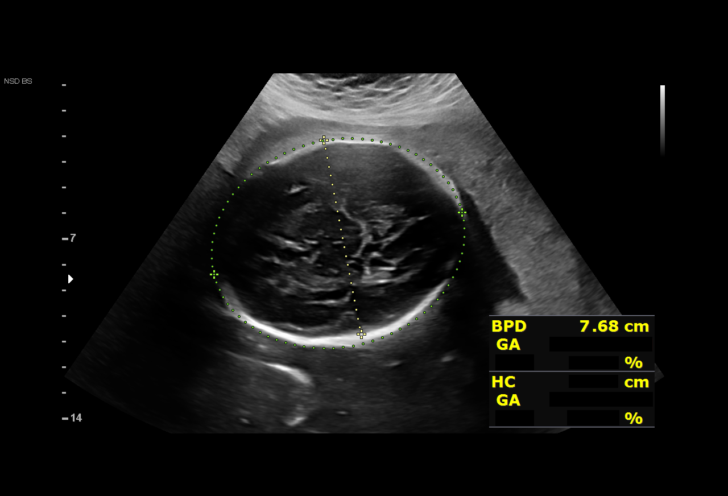
[im 11/24]
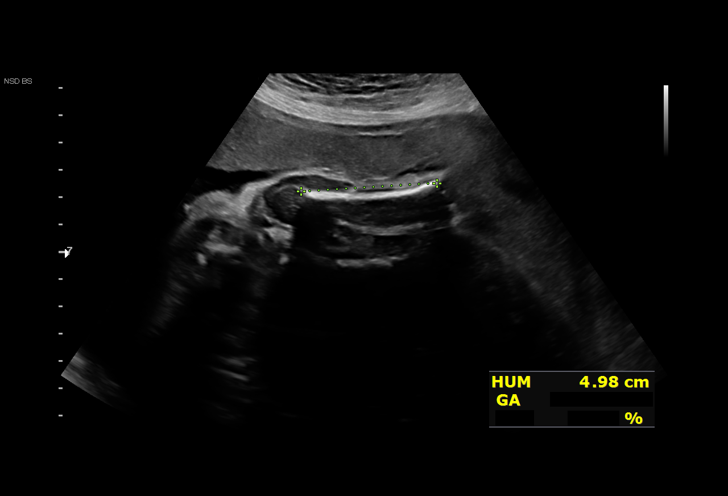
[im 13/24]
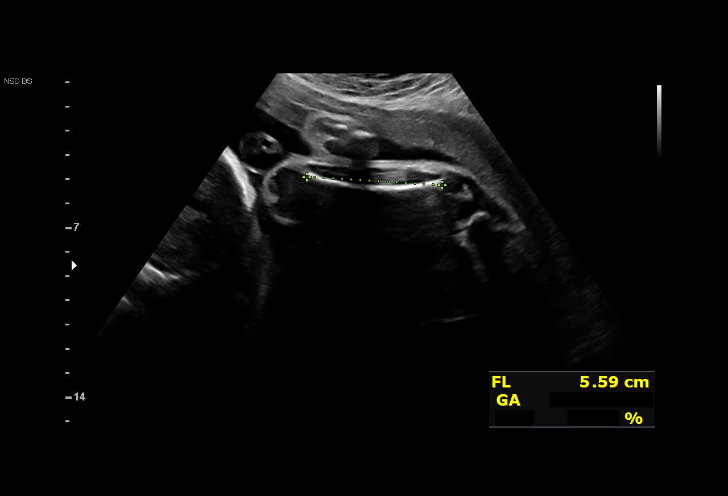
[im 14/24]
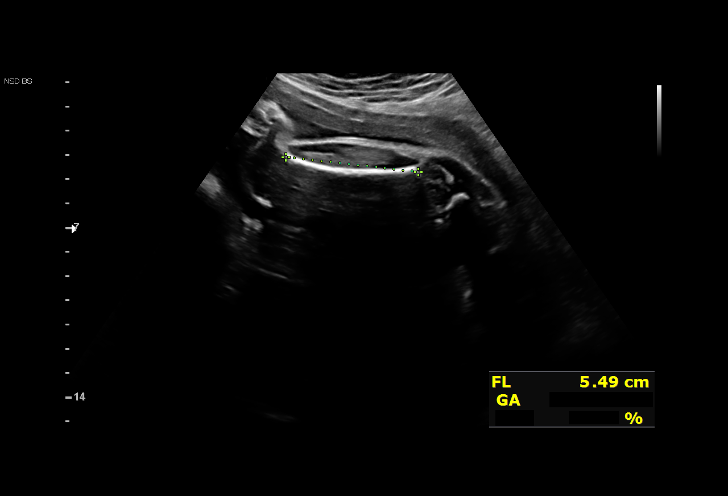
[im 16/24]
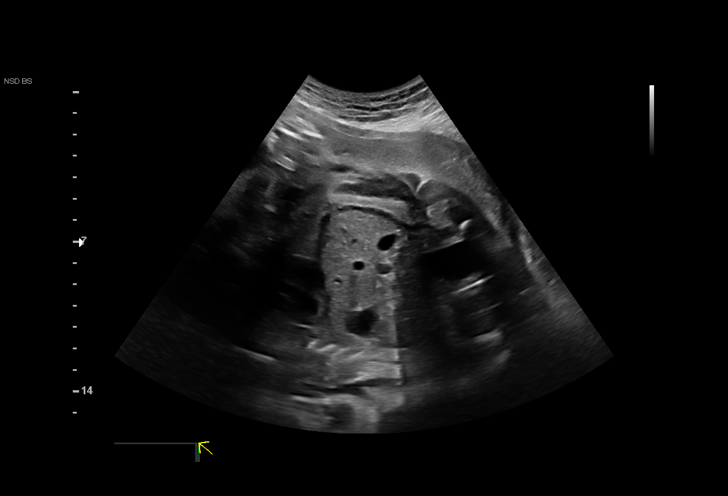
[im 18/24]
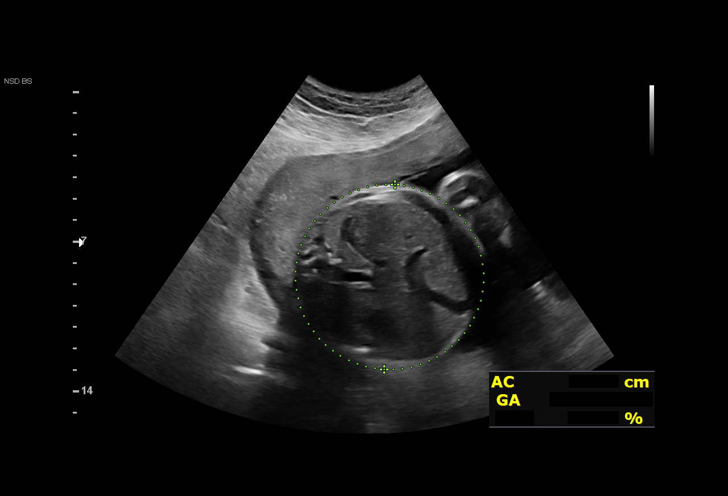
[im 20/24]
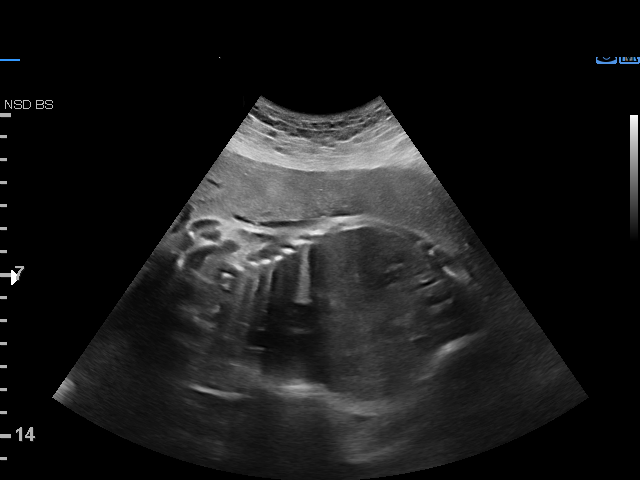
[im 22/24]
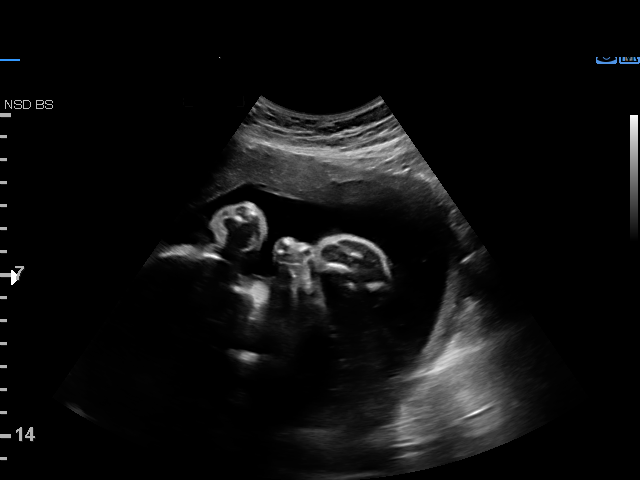
[im 24/24]
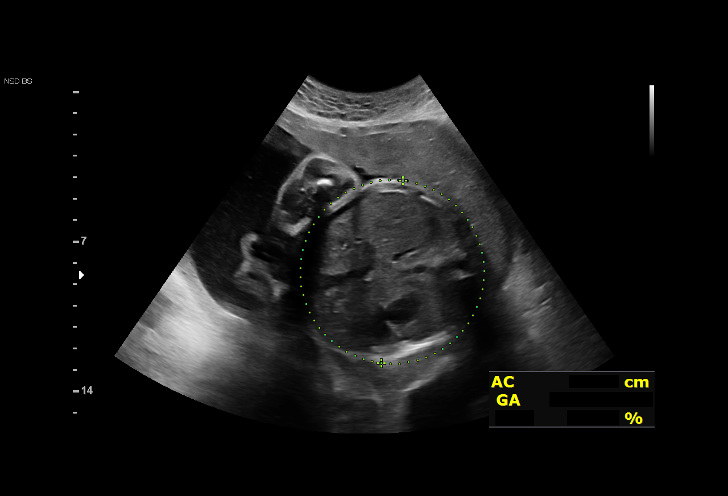

[13 of 24 positions shown; findings below may reference images not displayed]

Indications

 Advanced maternal age multigravida 35+,
 second trimester (40)
 29 weeks gestation of pregnancy
 Gestational diabetes in pregnancy, diet
 controlled
 Medical complication of pregnancy ([place]
 Disease)
 Hypertension - Chronic/Pre-existing
 (nifedipine)
 Antenatal screening for malformations
 Poor obstetric history: Previous preterm
 delivery, antepartum x 2 (34 and 35+3 wks)
 Poor obstetric history: Previous
 preeclampsia/GDM
 History of sickle cell trait
 Maternal thalassemia complicating
 pregnancy in second trimester (Hgb E-beta)
 Negative AFP
 LOW risk NIPS
Fetal Evaluation

 Num Of Fetuses:         1
 Fetal Heart Rate(bpm):  143
 Cardiac Activity:       Observed
 Presentation:           Breech
 Placenta:               Anterior
 P. Cord Insertion:      Previously Visualized
 Amniotic Fluid
 AFI FV:      Within normal limits

 AFI Sum(cm)     %Tile       Largest Pocket(cm)
 22.27           90

 RUQ(cm)       RLQ(cm)       LUQ(cm)        LLQ(cm)

Biometry

 BPD:      76.4  mm     G. Age:  30w 5d         72  %    CI:        72.85   %    70 - 86
                                                         FL/HC:      19.5   %    19.2 -
 HC:      284.6  mm     G. Age:  31w 2d         65  %    HC/AC:      1.05        0.99 -
 AC:      272.3  mm     G. Age:  31w 2d         89  %    FL/BPD:     72.5   %    71 - 87
 FL:       55.4  mm     G. Age:  29w 1d         25  %    FL/AC:      20.3   %    20 - 24
 HUM:      50.1  mm     G. Age:  29w 3d         45  %
 LV:        5.6  mm

 Est. FW:    6715  gm      3 lb 9 oz     74  %
OB History

 Gravidity:    4         Term:   1        Prem:   2        SAB:   0
 TOP:          0       Ectopic:  0        Living: 3
Gestational Age

 LMP:           29w 1d        Date:  11/29/19                 EDD:   09/04/20
 U/S Today:     30w 4d                                        EDD:   08/25/20
 Best:          29w 4d     Det. By:  U/S  (04/25/20)          EDD:   09/01/20
Anatomy

 Cranium:               Appears normal         LVOT:                   Previously seen
 Cavum:                 Previously seen        Aortic Arch:            Previously seen
 Ventricles:            Appears normal         Ductal Arch:            Previously seen
 Choroid Plexus:        Previously seen        Diaphragm:              Appears normal
 Cerebellum:            Previously seen        Stomach:                Appears normal, left
                                                                       sided
 Posterior Fossa:       Previously seen        Abdomen:                Appears normal
 Nuchal Fold:           Not applicable (>20    Abdominal Wall:         Previously seen
                        wks GA)
 Face:                  Orbits and profile     Cord Vessels:           Previously seen
                        previously seen
 Lips:                  Previously seen        Kidneys:                Appear normal
 Palate:                Previously seen        Bladder:                Appears normal
 Thoracic:              Appears normal         Spine:                  Previously seen
 Heart:                 Appears normal         Upper Extremities:      Previously seen
                        (4CH, axis, and
                        situs)
 RVOT:                  Previously seen        Lower Extremities:      Previously seen

 Other:  Heels/feet and open hands/5th digits previously visualized. Nasal
         bone, and Lenses previously visualized. VC, 3VV and 3VTV
         previously visualized.
Cervix Uterus Adnexa
 Cervix
 Not visualized (advanced GA >61wks)
Impression

 Chronic hypertension patient takes nifedipine.  Blood
 pressures today at her office were 151/86 and 144/90 mmHg.
 She reports her headache is resolved now.
 She has a recent diagnosis of gestational diabetes that is
 well controlled on diet
 Fetal growth is appropriate for gestational age.  Amniotic fluid
 is normal and good fetal activity seen.
Recommendations

 -Appointments were made for weekly BPP from 32 weeks'
 gestation.
                 Barzuna, Emidey

## 2022-05-14 IMAGING — US US FETAL BPP W/ NON-STRESS
1 series · 14 of 14 positions shown · non-contrast
Comparison: none

[Series 1: us fetal bpp w/ non-stress · 14 acquisitions, 14 frames shown]
[im 1/14]
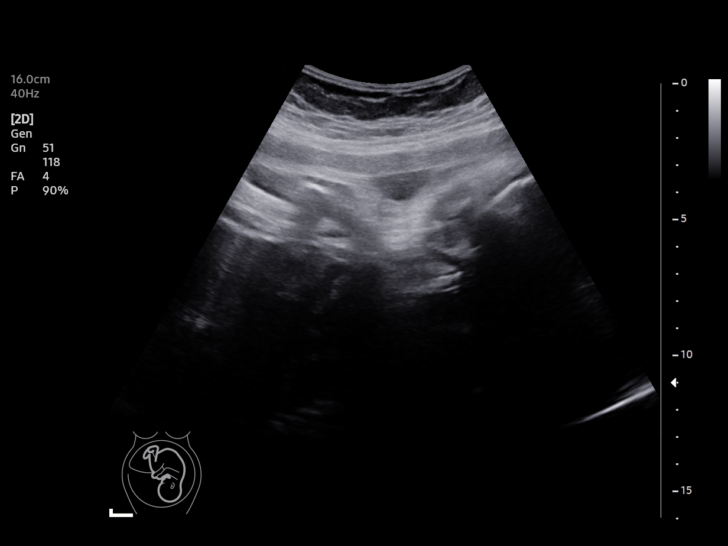
[im 2/14]
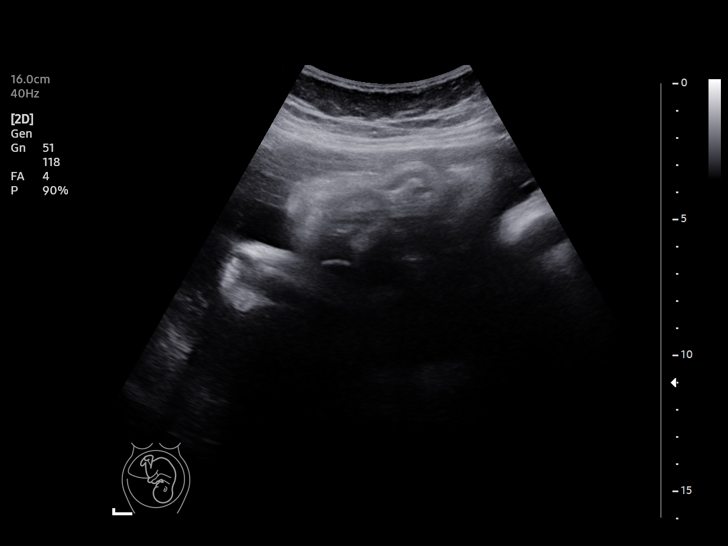
[im 3/14]
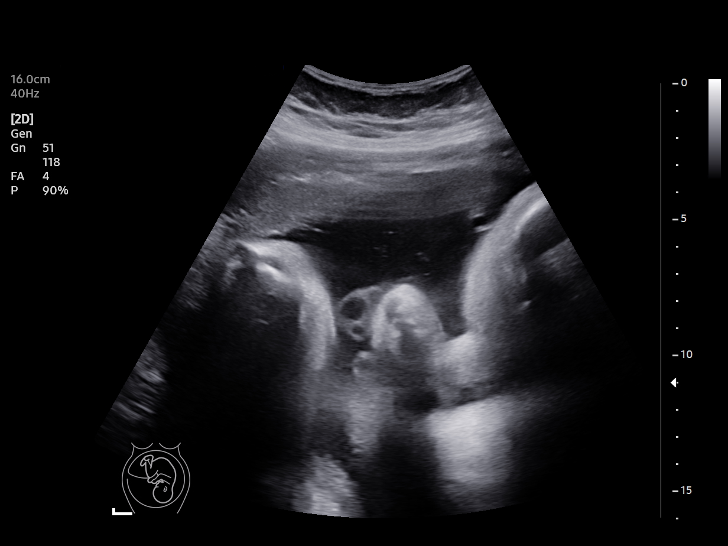
[im 4/14]
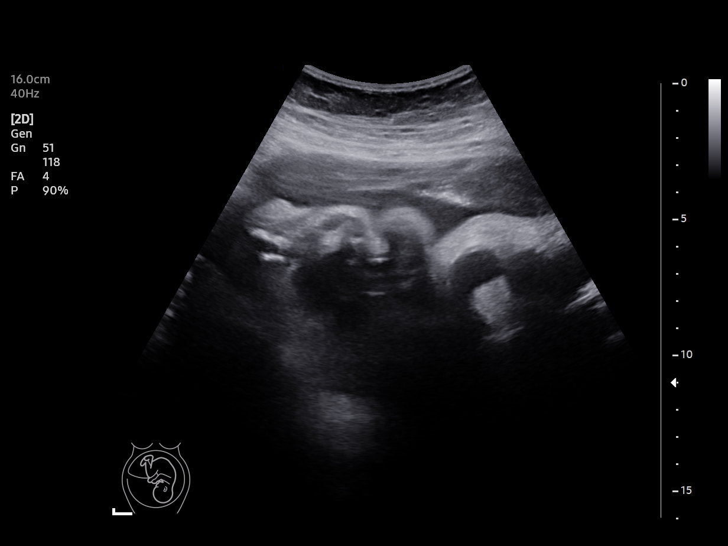
[im 5/14]
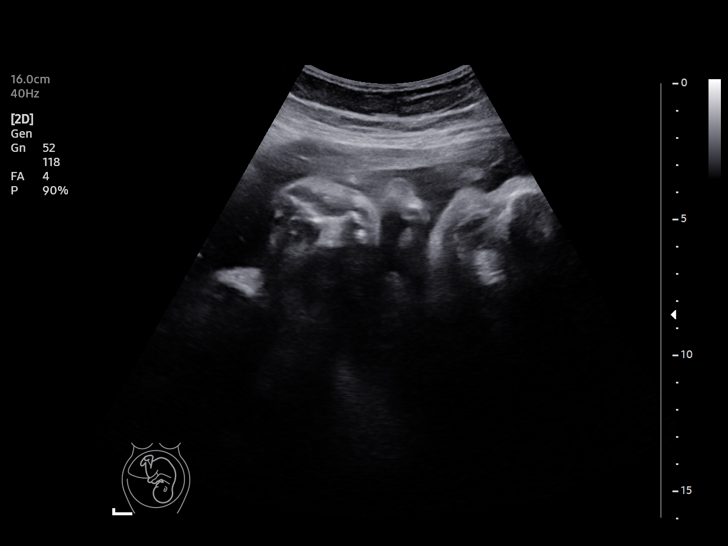
[im 6/14]
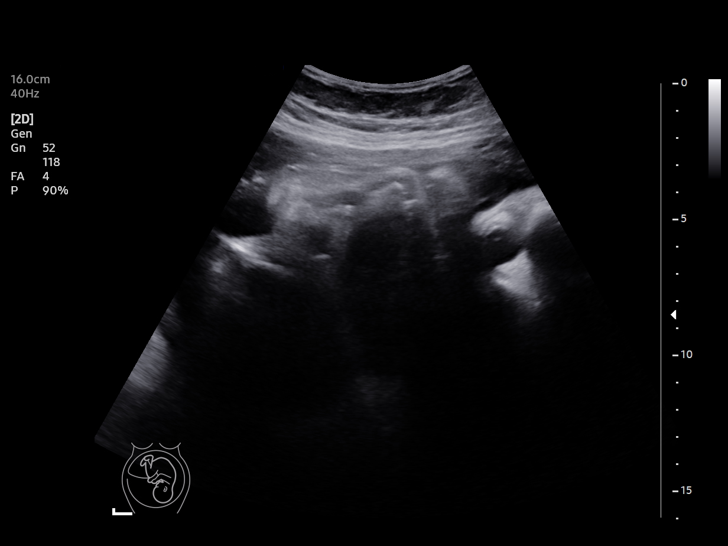
[im 7/14]
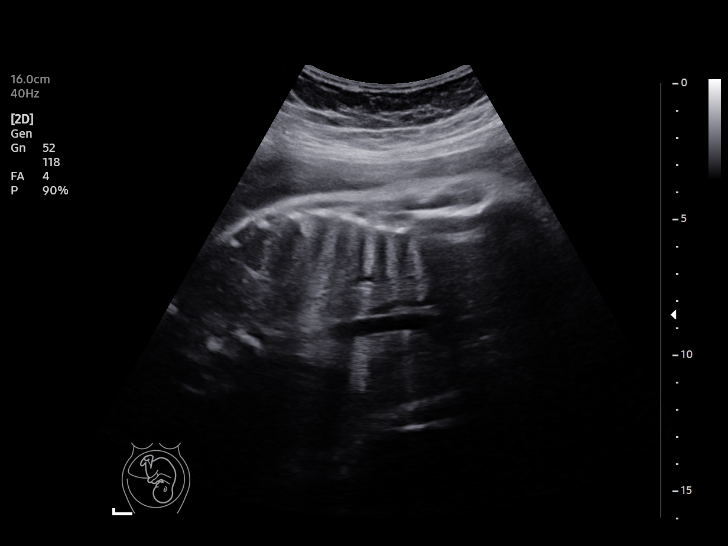
[im 8/14]
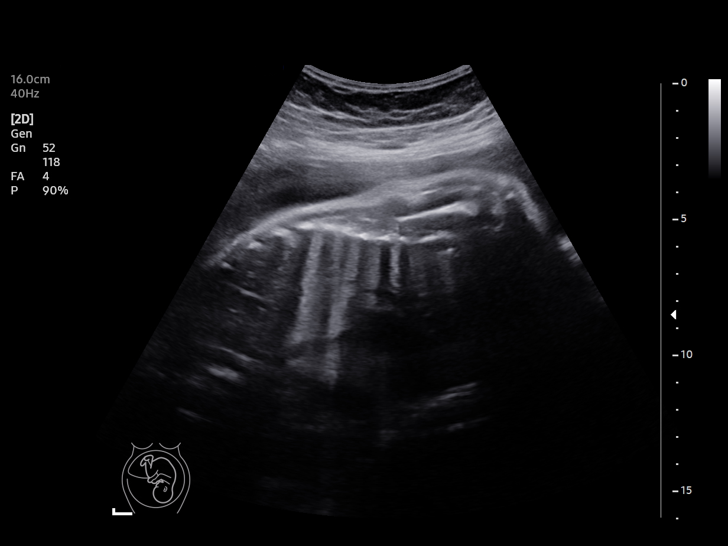
[im 9/14]
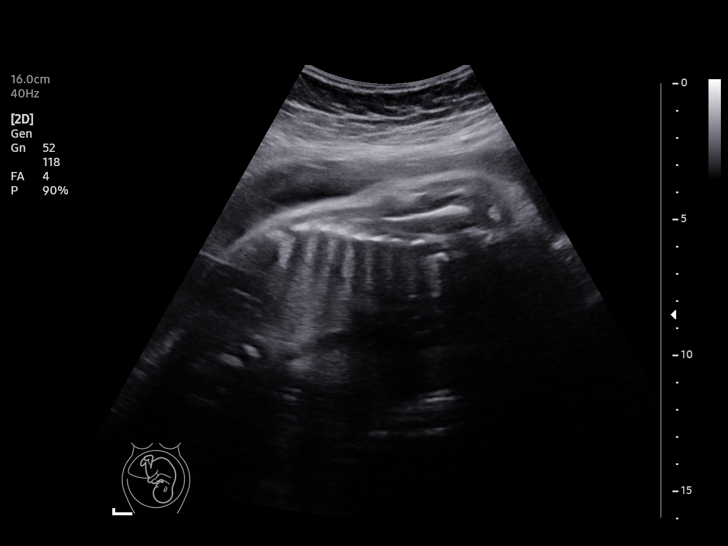
[im 10/14]
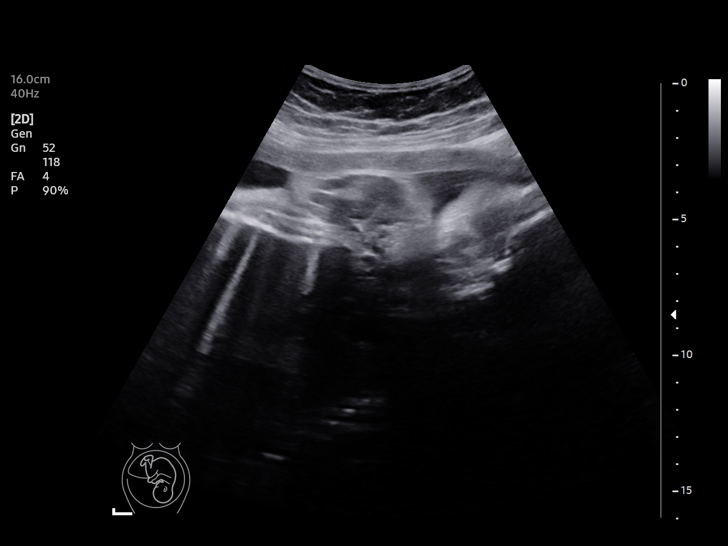
[im 11/14]
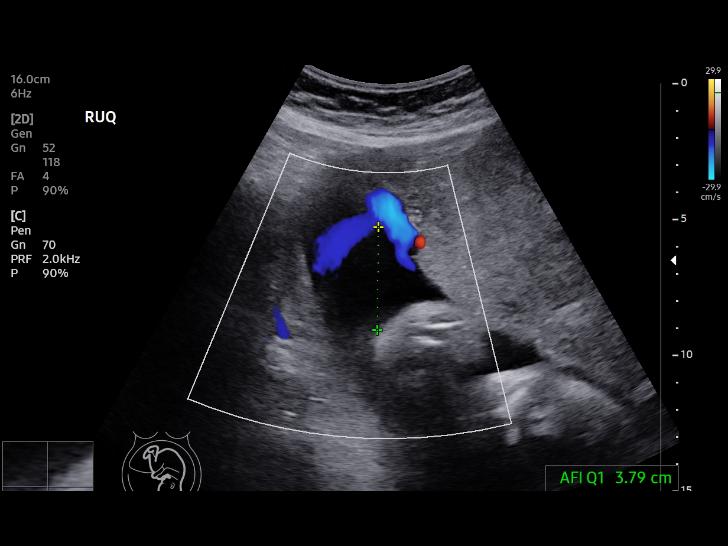
[im 12/14]
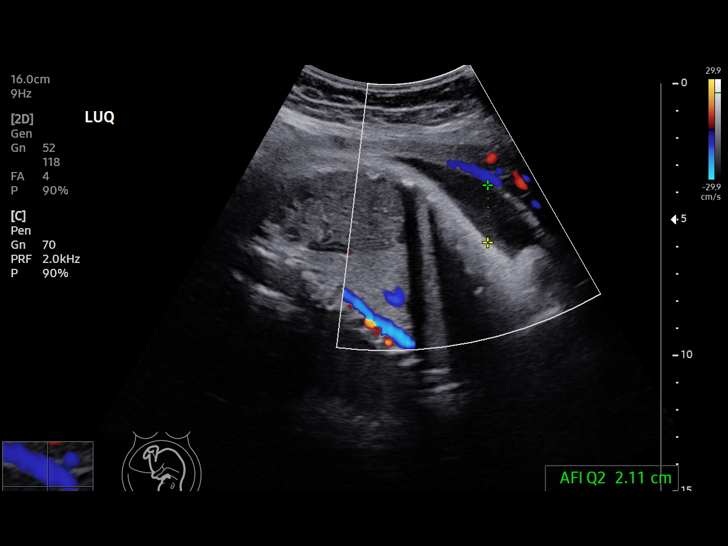
[im 13/14]
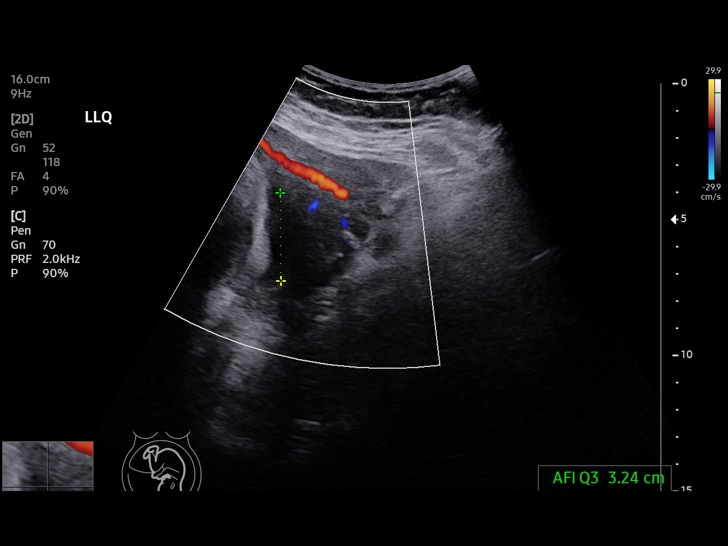
[im 14/14]
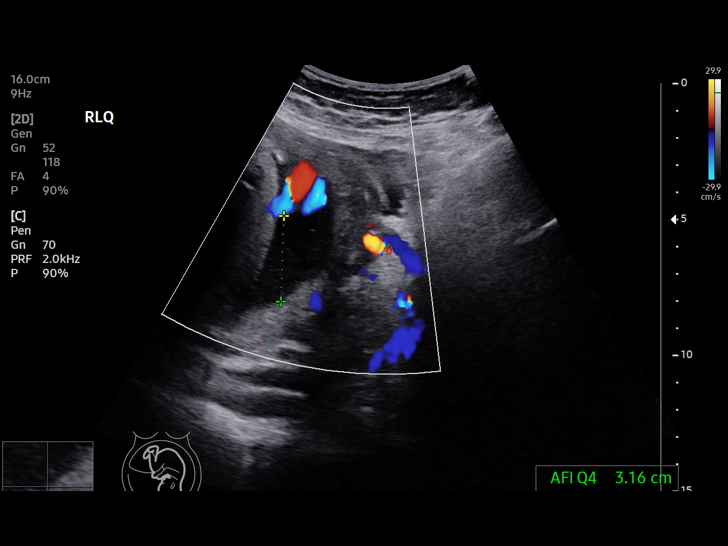

[14 of 14 positions shown; findings below may reference images not displayed]

[REDACTED]care at

Service(s) Provided

Indications

 35 weeks gestation of pregnancy
 Hypertension - Chronic/Pre-existing
 Gestational diabetes in pregnancy,
 controlled by oral hypoglycemic drugs
Fetal Evaluation

 Num Of Fetuses:          1
 Preg. Location:          Intrauterine
 Cardiac Activity:        Observed
 Presentation:            Cephalic

 Amniotic Fluid
 AFI FV:      Within normal limits

 AFI Sum(cm)     %Tile       Largest Pocket(cm)
 12.3            38

 RUQ(cm)       RLQ(cm)       LUQ(cm)        LLQ(cm)


 Comment:    BPP [DATE]
Biophysical Evaluation
 Amniotic F.V:   Pocket => 2 cm             F. Tone:         Observed
 F. Movement:    Observed                   N.S.T:           Reactive
 F. Breathing:   Observed                   Score:           [DATE]
OB History

 Gravidity:    4         Term:   1        Prem:   2        SAB:   0
 TOP:          0       Ectopic:  0        Living: 3
Gestational Age

 LMP:           34w 6d        Date:  11/29/19                 EDD:   09/04/20
 Best:          35w 2d     Det. By:  U/S  (04/25/20)          EDD:   09/01/20

## 2022-10-03 ENCOUNTER — Encounter: Payer: Self-pay | Admitting: Internal Medicine

## 2022-10-03 ENCOUNTER — Ambulatory Visit (INDEPENDENT_AMBULATORY_CARE_PROVIDER_SITE_OTHER): Payer: 59 | Admitting: Internal Medicine

## 2022-10-03 VITALS — BP 138/80 | HR 95 | Ht 62.0 in | Wt 178.2 lb

## 2022-10-03 DIAGNOSIS — E0500 Thyrotoxicosis with diffuse goiter without thyrotoxic crisis or storm: Secondary | ICD-10-CM

## 2022-10-03 DIAGNOSIS — E119 Type 2 diabetes mellitus without complications: Secondary | ICD-10-CM | POA: Diagnosis not present

## 2022-10-03 DIAGNOSIS — E1165 Type 2 diabetes mellitus with hyperglycemia: Secondary | ICD-10-CM

## 2022-10-03 LAB — LIPID PANEL
Cholesterol: 255 mg/dL — ABNORMAL HIGH (ref 0–200)
HDL: 46.6 mg/dL (ref >39.00)
LDL Cholesterol: 174 mg/dL — ABNORMAL HIGH (ref 0–99)
NonHDL: 208.6
Total CHOL/HDL Ratio: 5
Triglycerides: 172 mg/dL — ABNORMAL HIGH (ref 0.0–149.0)
VLDL: 34.4 mg/dL (ref 0.0–40.0)

## 2022-10-03 LAB — TSH: TSH: 2.8 u[IU]/mL (ref 0.35–5.50)

## 2022-10-03 LAB — COMPREHENSIVE METABOLIC PANEL
ALT: 9 U/L (ref 0–35)
AST: 15 U/L (ref 0–37)
Albumin: 4.6 g/dL (ref 3.5–5.2)
Alkaline Phosphatase: 40 U/L (ref 39–117)
BUN: 14 mg/dL (ref 6–23)
CO2: 27 meq/L (ref 19–32)
Calcium: 9.4 mg/dL (ref 8.4–10.5)
Chloride: 101 meq/L (ref 96–112)
Creatinine, Ser: 0.54 mg/dL (ref 0.40–1.20)
GFR: 113.41 mL/min (ref >60.00)
Glucose, Bld: 104 mg/dL — ABNORMAL HIGH (ref 70–99)
Potassium: 4.2 meq/L (ref 3.5–5.1)
Sodium: 135 meq/L (ref 135–145)
Total Bilirubin: 0.8 mg/dL (ref 0.2–1.2)
Total Protein: 7.3 g/dL (ref 6.0–8.3)

## 2022-10-03 LAB — T3, FREE: T3, Free: 3 pg/mL (ref 2.3–4.2)

## 2022-10-03 LAB — MICROALBUMIN / CREATININE URINE RATIO
Creatinine,U: 23.3 mg/dL
Microalb Creat Ratio: 3.4 mg/g (ref 0.0–30.0)
Microalb, Ur: 0.8 mg/dL (ref 0.0–1.9)

## 2022-10-03 LAB — T4, FREE: Free T4: 0.73 ng/dL (ref 0.60–1.60)

## 2022-10-03 NOTE — Patient Instructions (Signed)
Please continue off methimazole for now.  Please stop at the lab.  Please come back for a follow-up appointment in 6 months.   

## 2022-10-03 NOTE — Progress Notes (Unsigned)
Patient ID: Tammy Byrd, female   DOB: [birth year], 42 y.o.   MRN: [id number]   HPI  Tammy Byrd is a 42 y.o.-year-old female, returning for follow-up for Graves' disease.  She also has a new diagnosis of type II diabetes.  Last visit 6 months ago.  She is here with her daughter.  Interim history: No tremors, palpitations, heat intolerance. She gained 13 lbs before last OV and 6 more lbs since then.  She has anxiety. She has dry mouth. She is still breastfeeding off and on.  Reviewed history: She had 2 UTIs in 07/2016 and started to feel poorly afterwards.  She saw her PCP in 08/2016 for palpitations, fatigue, increased sweating.  TFTs were in the thyrotoxic range.  She was referred to endocrinology.  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 and 5 mg in p.m. in 11/2016.  We also started atenolol 25 mg twice a day but decrease the dose to only once a day in the past due to dizziness.  In 11/2017: we were able to decrease the methimazole to 5 mg twice a day.  We continued atenolol.  In 04/2018: we decreased the methimazole to 5 mg once a day.  She did not return for labs in 1.5 months, as advised.  Upon questioning, she was occasionally missing methimazole doses.  In 10/2018: We decrease methimazole to 2.5 mg daily  In 05/2019: We stopped methimazole completely.  She continues off the medication.  Reviewed her TFTs: Lab Results  Component Value Date   TSH 3.24 03/28/2022   TSH 1.37 07/26/2021   TSH 1.41 01/25/2021   TSH 0.95 07/20/2020   TSH 1.140 04/11/2020   TSH 0.514 02/29/2020   TSH 1.52 01/20/2020   TSH 1.55 11/11/2019   TSH 2.010 11/07/2019   TSH 2.27 08/19/2019   FREET4 1.3 03/28/2022   FREET4 0.84 07/26/2021   FREET4 0.86 01/25/2021   FREET4 0.77 07/20/2020   FREET4 0.97 04/11/2020    FREET4 0.91 01/20/2020   FREET4 0.96 11/11/2019   FREET4 1.23 11/07/2019   FREET4 1.00 08/19/2019   FREET4 0.96 07/14/2019   Lab Results  Component Value Date   T3FREE 3.2 03/28/2022   T3FREE 3.2 07/26/2021   T3FREE 3.7 01/25/2021   T3FREE 2.5 07/20/2020   T3FREE 3.0 01/20/2020   T3FREE 3.1 11/11/2019   T3FREE 2.7 11/07/2019   T3FREE 3.0 08/19/2019   T3FREE 3.4 07/14/2019   T3FREE 3.5 11/05/2018  11/12/2020: TSH 1.64  Pt denies: - feeling nodules in neck - hoarseness - dysphagia - choking  No family history of thyroid disease except thyroid cancer in mother.  No FH of thyroid cancer. No h/o radiation tx to head or neck. No herbal supplements. No Biotin use. No recent steroids use.   Reviewed HbA1c levels: Lab Results  Component Value Date   HGBA1C 6.5 (A) 03/28/2022   HGBA1C 6.6 (H) 07/26/2021   HGBA1C 6.6 (H) 01/25/2021   HGBA1C 6.3 (A) 07/20/2020   HGBA1C 6.2 (H) 02/29/2020   HGBA1C 6.0 (H) 11/07/2019  06/07/2021:  HbA1c 6.6% 11/12/2020: HbA1c 6.0%  She was started on metformin 500 by High Risk Ob >> increased to 1000 mg at bedtime (increased 07/2020) >> now off. She is not checking blood sugars at home currently.  She checks sugars 1x a  day: - am: 90-110 >> 100-120 - 2h after b'fast: 90s >> n/c - lunch: n/c >> 100-105 - 2h after lunch: 90s >> 120 - dinner: n/c - 2h after dinner: 101-125 >> 120-130 - bedtime: n/c  Labs reviewed most recent records from PCP: Microalbumin Panel Reviewed date:12/13/2021 07:51:34 PM Interpretation:High Performing Lab: Notes/Report: Testing Performed at: [facility], [address], [place], [place] [address]  MA/CR ratio 48.8 0.0-30.0 mg/G    UMA 2.44 0.00-1.90 mg/dL    UCR 50      07/22/5407:  Glucose 98, BUN/creatinine 16/0.52, GFR 119 Lipids: 263/103/59/186-advised to start on Crestor 20 mg daily after she finished b'feeding - he did not start it 11/12/2020: Glucose 102, BUN/creatinine/0.51, GFR  121 Lipids: 330/167/61/237 -she was advised to reduce red meat, dairy, and fried foods.  Also, she was given other instructions about improving diet.  She was not started on a statin as she was breast-feeding.  This is followed by PCP.  She gave birth in 07/2020-has a boy.  She had preeclampsia with the latest pregnancy.  She also has 3 girls.  Last eye exam: EYE EXAM Reviewed date:09/19/2022 09:34:59 AM Interpretation:No diabetic retinopathy Performing Lab: Notes/Report: No diabetic retinopathy  Y/N Retinopathy N     ROS: + see HPI  I reviewed pt's medications, allergies, PMH, social hx, family hx, and changes were documented in the history of present illness. Otherwise, unchanged from my initial visit note.  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    GDM (gestational diabetes mellitus)    Gestational diabetes    History of severe pre-eclampsia    Pregnancy induced hypertension    Past Surgical History:  Procedure Laterality Date   CESAREAN SECTION N/A 09/04/2015   Procedure: CESAREAN SECTION;  Surgeon: Levie Heritage, DO;  Location: [facility];  Service: Obstetrics;  Laterality: N/A;   NO PAST SURGERIES     Social History   Social History   Marital status: Single,    Spouse name: N/A   Number of children: 4   Occupational History   Dental asstnt   Social History Main Topics   Smoking status: Never Smoker   Smokeless tobacco: Never Used   Alcohol use No   Drug use: No   Current Outpatient Medications  Medication Sig Dispense Refill   acetaminophen (TYLENOL) 325 MG tablet Take 650 mg by mouth every 6 (six) hours as needed for mild pain or headache.     amLODipine (NORVASC) 5 MG tablet Take 1 tablet (5 mg total) by mouth daily. 180 tablet 3   ferrous gluconate (FERGON) 324 MG tablet Take 1 tablet (324 mg total) by mouth daily with breakfast. 90 tablet 3   ibuprofen (ADVIL) 600 MG tablet Take 1 tablet  (600 mg total) by mouth every 6 (six) hours as needed. (Patient taking differently: Take 600 mg by mouth every 6 (six) hours as needed for headache, mild pain or moderate pain.) 30 tablet 0   Prenatal Vit-Fe Fumarate-FA (PRENATAL PO) Take 1 capsule by mouth daily. Unknown strength     No current facility-administered medications for this visit.   Allergies  Allergen Reactions   Hydrochlorothiazide Rash   Latex Rash    BUMPS AND SWELLING   Family History  Problem Relation Age of Onset   Kidney disease Mother    Heart disease Father    PE: BP 138/80   Pulse 95   Ht  5\' 2"  (1.575 m)   Wt 178 lb 3.2 oz (80.8 kg)   SpO2 99%   BMI 32.59 kg/m  Wt Readings from Last 3 Encounters:  10/03/22 178 lb 3.2 oz (80.8 kg)  03/28/22 173 lb 2 oz (78.5 kg)  07/26/21 160 lb 12.8 oz (72.9 kg)   Constitutional: overweight, in NAD Eyes: EOMI, no exophthalmos ENT: + mild, symmetric, thyromegaly, no cervical lymphadenopathy Cardiovascular: tachycardia, RR, No MRG Respiratory: CTA B Musculoskeletal: no deformities Skin:  no rashes Neurological: no tremor with outstretched hands Diabetic Foot Exam - Simple   Simple Foot Form Diabetic Foot exam was performed with the following findings: Yes 10/03/2022 11:17 AM  Visual Inspection No deformities, no ulcerations, no other skin breakdown bilaterally: Yes Sensation Testing Intact to touch and monofilament testing bilaterally: Yes Pulse Check Posterior Tibialis and Dorsalis pulse intact bilaterally: Yes Comments    ASSESSMENT:  1.  Graves' disease  2.  Type 2 diabetes - H/o gestational diabetes  3. HL  PLAN:  1. Patient with history of thyrotoxicosis, with initial thyrotoxic symptoms: Weight loss, heat intolerance, hyperdefecation, palpitations, anxiety, which resolved on methimazole.  She was initially started on 10 mg in a.m. and 5 mg in p.m. and also atenolol twice a day, currently off the beta-blocker.  She was also able to come off  methimazole completely in 05/2019, with subsequently normal TFTs -Her TSI antibodies were elevated, pointing towards a diagnosis of Graves' disease so we did not perform a thyroid uptake and scan.  -We reviewed together her TFTs from last visit and they were normal.  We will repeat these today.  If normal, will plan to repeat them in 1 year. -No signs of active Graves' ophthalmopathy: No double vision, blurry vision, eye pain, chemosis. -She has anxiety with doctors appointments and is usually tachycardic during the appointments.  Heart rate was normal towards the end of the visit.  No tremors. -I will see her back in 6 months.    2.  Type 2 diabetes -She has a history of GDM, not requiring insulin.  She came off metformin after she gave birth. -At last visit, HbA1c was 6.5%, lower, decreased from 6.6%.  She was checking blood sugars rotating check times and they were at goal. -At that time, she continued to work on the diet.  However, she gained 13 pounds, previously having lost 13 pounds. -We discussed about further improvement in her diet  - we will check an HbA1c today - advised to check sugars at different times of the day - 1x a day, rotating check times - advised for yearly eye exams >> she is UTD - return to clinic in 6 months  3. HL -Reviewed latest lipid panel from 05/2021: Very high LDL, at 186, previously in the 200s.  Crestor was suggested at 20 mg daily but did not start due to breast-feeding.  She is still breast-feeding off-and-on now. -We will check another lipid panel today.  She will need to be on statins after she stops breast-feeding completely.  No further plans for pregnancy.   Component     Latest Ref Rng 10/03/2022  TSH     0.35 - 5.50 uIU/mL 2.80   T4,Free(Direct)     0.60 - 1.60 ng/dL 3.29   Triiodothyronine,Free,Serum     2.3 - 4.2 pg/mL 3.0   Glucose     70 - 99 mg/dL 518 (H)   BUN     6 - 23 mg/dL 14   Creatinine  0.40 - 1.20 mg/dL 8.29   Sodium      937 - 145 mEq/L 135   Potassium     3.5 - 5.1 mEq/L 4.2   Chloride     96 - 112 mEq/L 101   CO2     19 - 32 mEq/L 27   Calcium     8.4 - 10.5 mg/dL 9.4   Total Protein     6.0 - 8.3 g/dL 7.3   Albumin     3.5 - 5.2 g/dL 4.6   Total Bilirubin     0.2 - 1.2 mg/dL 0.8   Alkaline Phosphatase     39 - 117 U/L 40   AST     0 - 37 U/L 15   ALT     0 - 35 U/L 9   Triglycerides     0.0 - 149.0 mg/dL 169.6 (H)   HDL Cholesterol     >39.00 mg/dL 78.93   Total CHOL/HDL Ratio 5   GFR     >60.00 mL/min 113.41   Cholesterol     0 - 200 mg/dL 810 (H)   VLDL     0.0 - 40.0 mg/dL 17.5   LDL (calc)     0 - 99 mg/dL 102 (H)   NonHDL 585.27   Microalb, Ur     0.0 - 1.9 mg/dL 0.8   Creatinine,U     mg/dL 78.2   MICROALB/CREAT RATIO     0.0 - 30.0 mg/g 3.4   HbA1c still pending. ACR normal. Tryglycerides are slightly elevated as is the LDL.  I would suggest to start Crestor as soon as she stops breast-feeding.  I am will advise her to let me know.  Carlus Pavlov, MD PhD [facility]

## 2022-10-06 ENCOUNTER — Other Ambulatory Visit: Payer: 59

## 2022-10-06 DIAGNOSIS — E1165 Type 2 diabetes mellitus with hyperglycemia: Secondary | ICD-10-CM

## 2022-10-07 LAB — HEMOGLOBIN A1C
Hgb A1c MFr Bld: 6.6 %{Hb} — ABNORMAL HIGH (ref <5.7)
Mean Plasma Glucose: 143 mg/dL
eAG (mmol/L): 7.9 mmol/L

## 2023-01-30 ENCOUNTER — Encounter: Payer: Self-pay | Admitting: Internal Medicine

## 2023-10-09 ENCOUNTER — Ambulatory Visit: Payer: 59 | Admitting: Internal Medicine

## 2023-10-29 ENCOUNTER — Encounter: Payer: Self-pay | Admitting: Internal Medicine

## 2023-10-29 ENCOUNTER — Ambulatory Visit: Admitting: Internal Medicine

## 2023-10-29 VITALS — BP 120/70 | HR 83 | Ht 62.0 in | Wt 177.0 lb

## 2023-10-29 DIAGNOSIS — Z8632 Personal history of gestational diabetes: Secondary | ICD-10-CM

## 2023-10-29 DIAGNOSIS — E1165 Type 2 diabetes mellitus with hyperglycemia: Secondary | ICD-10-CM | POA: Diagnosis not present

## 2023-10-29 DIAGNOSIS — E0500 Thyrotoxicosis with diffuse goiter without thyrotoxic crisis or storm: Secondary | ICD-10-CM | POA: Diagnosis not present

## 2023-10-29 LAB — POCT GLYCOSYLATED HEMOGLOBIN (HGB A1C): Hemoglobin A1C: 6.3 % — AB (ref 4.0–5.6)

## 2023-10-29 LAB — TSH: TSH: 1.98 m[IU]/L

## 2023-10-29 LAB — T4, FREE: Free T4: 1.3 ng/dL (ref 0.8–1.8)

## 2023-10-29 LAB — T3, FREE: T3, Free: 3.2 pg/mL (ref 2.3–4.2)

## 2023-10-29 MED ORDER — ACCU-CHEK SOFTCLIX LANCETS MISC: 3 refills | Status: AC

## 2023-10-29 MED ORDER — GLUCOSE BLOOD VI STRP: ORAL_STRIP | 3 refills | Status: AC

## 2023-10-29 MED ORDER — ACCU-CHEK GUIDE W/DEVICE KIT: PACK | 0 refills | Status: AC

## 2023-10-29 NOTE — Patient Instructions (Addendum)
 Please continue off methimazole  for now.  Please stop at the lab.  Look up the [employer] diet for lowering cholesterol.  Please come back for a follow-up appointment in 1 year.

## 2023-10-29 NOTE — Progress Notes (Signed)
 Patient ID: Tammy Byrd, female   DOB: Mar 27, 1980, 43 y.o.   MRN: [id number]   HPI  Tammy Byrd is a 43 y.o.-year-old female, returning for follow-up for Graves' disease.  She also has a new diagnosis of type II diabetes. Her mother, Tammy Byrd, is also my patient.  Last visit 1 year and 1 month ago.  Interim history: No tremors, palpitations, heat intolerance. Her son is 94 y/o.  She also has 3 girls.  No plans for further pregnancies. She has increased stress with her father who has stage IV lung cancer in her mother who had a failed ocular surgery.  Graves' disease: Reviewed history: She had 2 UTIs in 07/2016 and started to feel poorly afterwards.  She saw her PCP in 08/2016 for palpitations, fatigue, increased sweating.  TFTs were in the thyrotoxic range.  She was referred to endocrinology.  We confirmed thyrotoxicosis and also her TSI antibodies returned elevated, confirming Graves' disease:  Lab Results  Component Value Date   TSI <89 04/30/2018   TSI 263 (H) 11/19/2016   In 11/2017: We started methimazole  10 mg in a.m. and 5 mg in p.m. in 11/2016.  We also started atenolol  25 mg twice a day but decrease the dose to only once a day in the past due to dizziness.  In 11/2017: we were able to decrease the methimazole  to 5 mg twice a day.  We continued atenolol .  In 04/2018: we decreased the methimazole  to 5 mg once a day.  She did not return for labs in 1.5 months, as advised.  Upon questioning, she was occasionally missing methimazole  doses.  In 10/2018: We decrease methimazole  to 2.5 mg daily  In 05/2019: We stopped methimazole  completely.  She continues off the medication.  Reviewed her TFTs: 07/08/2023:  Lab Results  Component Value Date   TSH 2.80 10/03/2022   TSH 3.24 03/28/2022   TSH 1.37 07/26/2021   TSH 1.41 01/25/2021   TSH 0.95 07/20/2020   TSH 1.140 04/11/2020   TSH 0.514 02/29/2020   TSH 1.52 01/20/2020   TSH 1.55 11/11/2019   TSH 2.010 11/07/2019   FREET4 0.73  10/03/2022   FREET4 1.3 03/28/2022   FREET4 0.84 07/26/2021   FREET4 0.86 01/25/2021   FREET4 0.77 07/20/2020   FREET4 0.97 04/11/2020   FREET4 0.91 01/20/2020   FREET4 0.96 11/11/2019   FREET4 1.23 11/07/2019   FREET4 1.00 08/19/2019   Lab Results  Component Value Date   T3FREE 3.0 10/03/2022   T3FREE 3.2 03/28/2022   T3FREE 3.2 07/26/2021   T3FREE 3.7 01/25/2021   T3FREE 2.5 07/20/2020   T3FREE 3.0 01/20/2020   T3FREE 3.1 11/11/2019   T3FREE 2.7 11/07/2019   T3FREE 3.0 08/19/2019   T3FREE 3.4 07/14/2019  11/12/2020: TSH 1.64  Pt denies: - feeling nodules in neck - hoarseness - dysphagia - choking  No family history of thyroid  disease except thyroid  cancer in mother.  No FH of thyroid  cancer. No h/o radiation tx to head or neck. No herbal supplements. No Biotin use. No recent steroids use.   DM2: Reviewed HbA1c levels: 07/08/2023: HbA1c 6.5% Lab Results  Component Value Date   HGBA1C 6.6 (H) 10/06/2022   HGBA1C 6.5 (A) 03/28/2022   HGBA1C 6.6 (H) 07/26/2021   HGBA1C 6.6 (H) 01/25/2021   HGBA1C 6.3 (A) 07/20/2020   HGBA1C 6.2 (H) 02/29/2020   HGBA1C 6.0 (H) 11/07/2019  06/07/2021:  HbA1c 6.6% 11/12/2020: HbA1c 6.0%  She was started on metformin  500 by High  Risk Ob >> increased to 1000 mg at bedtime (increased 07/2020) >> came off.  She is not checking CBGs - from before: - am: 90-110 >> 100-120 - 2h after b'fast: 90s >> n/c - lunch: n/c >> 100-105 - 2h after lunch: 90s >> 120 - dinner: n/c - 2h after dinner: 101-125 >> 120-130 - bedtime: n/c  Latest CMP:   Microalbumin Panel Reviewed date:12/13/2021 07:51:34 PM Interpretation:High Performing Lab: Notes/Report: Testing Performed at: [facility], [address], [address], [place], [place] [address]  MA/CR ratio 48.8 0.0-30.0 mg/G    UMA 2.44 0.00-1.90 mg/dL    UCR 50      Latest Lipid panel:   Previous labs reviewed: 05/30/2021:  Glucose 98, BUN/creatinine 16/0.52, GFR 119 Lipids:  263/103/59/186-advised to start on Crestor 20 mg daily after she finished b'feeding - he did not start it 11/12/2020: Glucose 102, BUN/creatinine/0.51, GFR 121 Lipids: 330/167/61/237 -she was advised to reduce red meat, dairy, and fried foods.  Also, she was given other instructions about improving diet.  She was not started on a statin as she was breast-feeding.  This is followed by PCP.  She gave birth in 07/2020-has a boy.  She had preeclampsia with the latest pregnancy.  She also has 3 girls.  Last eye exam: 09/19/2022: No DR.  Last foot exam 10/03/2022.  ROS: + see HPI  I reviewed pt's medications, allergies, PMH, social hx, family hx, and changes were documented in the history of present illness. Otherwise, unchanged from my initial visit note.  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    GDM (gestational diabetes mellitus)    Gestational diabetes    History of severe pre-eclampsia    Pregnancy induced hypertension    Past Surgical History:  Procedure Laterality Date   CESAREAN SECTION N/A 09/04/2015   Procedure: CESAREAN SECTION;  Surgeon: Lang JINNY Peel, DO;  Location: [facility]IR[facility];  Service: Obstetrics;  Laterality: N/A;   NO PAST SURGERIES     Social History   Social History   Marital status: Single,    Spouse name: N/A   Number of children: 4   Occupational History   Dental asstnt   Social History Main Topics   Smoking status: Never Smoker   Smokeless tobacco: Never Used   Alcohol  use No   Drug use: No   Current Outpatient Medications  Medication Sig Dispense Refill   acetaminophen  (TYLENOL ) 325 MG tablet Take 650 mg by mouth every 6 (six) hours as needed for mild pain or headache.     amLODipine  (NORVASC ) 5 MG tablet Take 1 tablet (5 mg total) by mouth daily. 180 tablet 3   ferrous gluconate  (FERGON) 324 MG tablet Take 1 tablet (324 mg total) by mouth daily with breakfast. 90 tablet 3   ibuprofen   (ADVIL ) 600 MG tablet Take 1 tablet (600 mg total) by mouth every 6 (six) hours as needed. (Patient taking differently: Take 600 mg by mouth every 6 (six) hours as needed for headache, mild pain or moderate pain.) 30 tablet 0   Prenatal Vit-Fe Fumarate-FA (PRENATAL PO) Take 1 capsule by mouth daily. Unknown strength (Patient not taking: Reported on 10/03/2022)     No current facility-administered medications for this visit.   Allergies  Allergen Reactions   Hydrochlorothiazide  Rash   Latex Rash    BUMPS AND SWELLING   Family History  Problem Relation Age of Onset   Kidney disease Mother  Heart disease Father    PE: BP 120/70   Pulse 83   Ht 5' 2 (1.575 m)   Wt 177 lb (80.3 kg)   SpO2 98%   BMI 32.37 kg/m  Wt Readings from Last 3 Encounters:  10/29/23 177 lb (80.3 kg)  10/03/22 178 lb 3.2 oz (80.8 kg)  03/28/22 173 lb 2 oz (78.5 kg)   Constitutional: overweight, in NAD Eyes: EOMI, no exophthalmos ENT: + mild, symmetric, thyromegaly, no cervical lymphadenopathy Cardiovascular: RRR, No MRG Respiratory: CTA B Musculoskeletal: no deformities Skin:  no rashes Neurological: no tremor with outstretched hands Diabetic Foot Exam - Simple   Simple Foot Form Diabetic Foot exam was performed with the following findings: Yes 10/29/2023  3:35 PM  Visual Inspection No deformities, no ulcerations, no other skin breakdown bilaterally: Yes Sensation Testing Intact to touch and monofilament testing bilaterally: Yes Pulse Check Posterior Tibialis and Dorsalis pulse intact bilaterally: Yes Comments + B mild pitting edema    ASSESSMENT:  1.  Graves' disease  2.  Type 2 diabetes - H/o gestational diabetes  3. HL  PLAN:  1. Patient with history of thyrotoxicosis, initially with thyrotoxic symptoms including weight loss, heat intolerance, hyperdefecation, palpitations, anxiety, resolved on methimazole .  Her TSI antibodies were elevated, pointing towards a diagnosis of Graves'  disease so we did not perform a thyroid  uptake and scan She was initially on 10 mg in a.m. and 5 mg in p.m. and also atenolol  twice a day, but currently off both.  She was able to stop methimazole  in 05/2019 with subsequently normal TFTs. - At today's visit, she does not have thyrotoxic signs or symptoms.  At last visit, she had tachycardia, which she usually has during the appointments due to anxiety related to medical situations.  However, at today's visit, heart rate is normal. - At today's visit, we will recheck her TFTs and see if we need to restart methimazole  - I will see her back in 1 year  2.  Type 2 diabetes -She has a history of gestational diabetes mellitus, not requiring insulin.  She came off metformin  after she gave birth. -HbA1c is fairly stable, in the mid 6% range - Latest HbA1c obtained 4 months ago was 6.5% - For now, we do not have to restart metformin  but continue to work on her diet - we checked her HbA1c: 6.3% (lower) - She does not have a meter.  At today's visit I called in the Accu-Chek guide glucometer + supplies. - Foot exam was performed today - will also check a urine ACR today - return to clinic in 1 year  3. HL - Reviewed latest lipid panel from 06/2023: LDL 158, elevated, but improved from 186, and previously in the 200s.   - We could not start Crestor at last visit as she was still breast-feeding but I advised her to start this soon as she finished breast-feeding. She did not start.  At today's visit, she declines adding this mentioning that she plans to start exercising consistently.  I also suggested the portfolio diet.  Orders Placed This Encounter  Procedures   Microalbumin / creatinine urine ratio   TSH   T4, free   T3, free   POCT glycosylated hemoglobin (Hb A1C)   Lela Fendt, MD PhD [facility] Endocrinology

## 2023-10-30 ENCOUNTER — Ambulatory Visit: Payer: Self-pay | Admitting: Internal Medicine

## 2023-10-30 LAB — MICROALBUMIN / CREATININE URINE RATIO
Creatinine, Urine: 175 mg/dL (ref 20–275)
Microalb Creat Ratio: 22 mg/g{creat} (ref <30)
Microalb, Ur: 3.8 mg/dL

## 2024-04-29 ENCOUNTER — Ambulatory Visit: Admitting: Internal Medicine
